# Patient Record
Sex: Female | Born: 1995 | Hispanic: Yes | Marital: Married | Smoking: Never smoker
Health system: Southern US, Community
[De-identification: ages and names within clinical notes are randomized; demographics above are authoritative.]

## PROBLEM LIST (undated history)

## (undated) ENCOUNTER — Inpatient Hospital Stay: Payer: Self-pay

## (undated) DIAGNOSIS — J36 Peritonsillar abscess: Secondary | ICD-10-CM

## (undated) DIAGNOSIS — N83209 Unspecified ovarian cyst, unspecified side: Secondary | ICD-10-CM

## (undated) DIAGNOSIS — G43909 Migraine, unspecified, not intractable, without status migrainosus: Secondary | ICD-10-CM

## (undated) HISTORY — PX: CHOLECYSTECTOMY: SHX55

## (undated) HISTORY — DX: Migraine, unspecified, not intractable, without status migrainosus: G43.909

## (undated) HISTORY — DX: Unspecified ovarian cyst, unspecified side: N83.209

---
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
## 2014-04-09 ENCOUNTER — Emergency Department: Payer: Self-pay | Admitting: Emergency Medicine

## 2014-04-09 LAB — CBC WITH DIFFERENTIAL/PLATELET
BASOS ABS: 0 10*3/uL (ref 0.0–0.1)
Basophil %: 0.4 %
EOS PCT: 3.1 %
Eosinophil #: 0.3 10*3/uL (ref 0.0–0.7)
HCT: 42 % (ref 35.0–47.0)
HGB: 13.6 g/dL (ref 12.0–16.0)
Lymphocyte #: 3.8 10*3/uL — ABNORMAL HIGH (ref 1.0–3.6)
Lymphocyte %: 37.3 %
MCH: 28.1 pg (ref 26.0–34.0)
MCHC: 32.4 g/dL (ref 32.0–36.0)
MCV: 87 fL (ref 80–100)
Monocyte #: 0.7 x10 3/mm (ref 0.2–0.9)
Monocyte %: 6.6 %
Neutrophil #: 5.4 10*3/uL (ref 1.4–6.5)
Neutrophil %: 52.6 %
Platelet: 257 10*3/uL (ref 150–440)
RBC: 4.84 10*6/uL (ref 3.80–5.20)
RDW: 13.4 % (ref 11.5–14.5)
WBC: 10.3 10*3/uL (ref 3.6–11.0)

## 2014-04-09 LAB — URINALYSIS, COMPLETE
BILIRUBIN, UR: NEGATIVE
Blood: NEGATIVE
GLUCOSE, UR: NEGATIVE mg/dL (ref 0–75)
KETONE: NEGATIVE
Nitrite: NEGATIVE
PROTEIN: NEGATIVE
Ph: 6 (ref 4.5–8.0)
RBC,UR: 2 /HPF (ref 0–5)
Specific Gravity: 1.023 (ref 1.003–1.030)
Squamous Epithelial: 5
WBC UR: 2 /HPF (ref 0–5)

## 2014-04-09 LAB — COMPREHENSIVE METABOLIC PANEL
ALK PHOS: 80 U/L
ALT: 25 U/L (ref 12–78)
ANION GAP: 6 — AB (ref 7–16)
AST: 17 U/L (ref 0–26)
Albumin: 3.7 g/dL — ABNORMAL LOW (ref 3.8–5.6)
BILIRUBIN TOTAL: 0.2 mg/dL (ref 0.2–1.0)
BUN: 10 mg/dL (ref 9–21)
Calcium, Total: 8.5 mg/dL — ABNORMAL LOW (ref 9.0–10.7)
Chloride: 106 mmol/L (ref 97–107)
Co2: 25 mmol/L (ref 16–25)
Creatinine: 0.56 mg/dL — ABNORMAL LOW (ref 0.60–1.30)
EGFR (African American): >60
Glucose: 81 mg/dL (ref 65–99)
OSMOLALITY: 272 (ref 275–301)
Potassium: 3.8 mmol/L (ref 3.3–4.7)
SODIUM: 137 mmol/L (ref 132–141)
Total Protein: 7.7 g/dL (ref 6.4–8.6)

## 2014-04-09 LAB — HCG, QUANTITATIVE, PREGNANCY: Beta Hcg, Quant.: 9921 m[IU]/mL — ABNORMAL HIGH

## 2014-04-09 LAB — LIPASE, BLOOD: LIPASE: 233 U/L (ref 73–393)

## 2014-07-04 ENCOUNTER — Ambulatory Visit: Payer: Self-pay | Admitting: Advanced Practice Midwife

## 2014-09-09 ENCOUNTER — Observation Stay: Payer: Self-pay

## 2014-09-09 LAB — URINALYSIS, COMPLETE
BILIRUBIN, UR: NEGATIVE
Blood: NEGATIVE
Glucose,UR: NEGATIVE mg/dL (ref 0–75)
Ketone: NEGATIVE
Nitrite: POSITIVE
Ph: 6 (ref 4.5–8.0)
Protein: NEGATIVE
RBC,UR: 2 /HPF (ref 0–5)
Specific Gravity: 1.017 (ref 1.003–1.030)
Squamous Epithelial: 11

## 2014-09-11 LAB — URINE CULTURE

## 2014-10-07 ENCOUNTER — Observation Stay: Payer: Self-pay

## 2014-10-08 ENCOUNTER — Emergency Department: Payer: Self-pay | Admitting: Emergency Medicine

## 2014-10-27 ENCOUNTER — Observation Stay: Payer: Self-pay | Admitting: Obstetrics & Gynecology

## 2014-11-15 ENCOUNTER — Emergency Department: Payer: Self-pay | Admitting: Emergency Medicine

## 2014-11-16 ENCOUNTER — Observation Stay: Payer: Self-pay | Admitting: Obstetrics & Gynecology

## 2014-11-16 LAB — PIH PROFILE
ANION GAP: 11 (ref 7–16)
BUN: 6 mg/dL — ABNORMAL LOW (ref 7–18)
CHLORIDE: 108 mmol/L — AB (ref 98–107)
CREATININE: 0.44 mg/dL — AB (ref 0.60–1.30)
Calcium, Total: 7.9 mg/dL — ABNORMAL LOW (ref 9.0–10.7)
Co2: 21 mmol/L (ref 21–32)
Glucose: 84 mg/dL (ref 65–99)
HCT: 31.7 % — ABNORMAL LOW (ref 35.0–47.0)
HGB: 10.3 g/dL — AB (ref 12.0–16.0)
MCH: 24.3 pg — AB (ref 26.0–34.0)
MCHC: 32.6 g/dL (ref 32.0–36.0)
MCV: 75 fL — ABNORMAL LOW (ref 80–100)
Osmolality: 276 (ref 275–301)
Platelet: 289 10*3/uL (ref 150–440)
Potassium: 3.8 mmol/L (ref 3.5–5.1)
RBC: 4.26 10*6/uL (ref 3.80–5.20)
RDW: 15.9 % — ABNORMAL HIGH (ref 11.5–14.5)
SGOT(AST): 13 U/L (ref 0–26)
Sodium: 140 mmol/L (ref 136–145)
Uric Acid: 3.2 mg/dL (ref 3.0–5.8)
WBC: 9.4 10*3/uL (ref 3.6–11.0)

## 2014-11-16 LAB — PROTEIN / CREATININE RATIO, URINE
Creatinine, Urine: 125.7 mg/dL — ABNORMAL HIGH (ref 30.0–125.0)
PROTEIN/CREAT. RATIO: 159 mg/g{creat} (ref 0–200)
Protein, Random Urine: 20 mg/dL — ABNORMAL HIGH (ref 0–12)

## 2014-11-17 LAB — SEDIMENTATION RATE: ERYTHROCYTE SED RATE: 50 mm/h — AB (ref 0–20)

## 2014-11-22 ENCOUNTER — Inpatient Hospital Stay: Payer: Self-pay

## 2014-11-22 LAB — CBC WITH DIFFERENTIAL/PLATELET
BASOS ABS: 0 10*3/uL (ref 0.0–0.1)
Basophil %: 0.5 %
EOS ABS: 0.2 10*3/uL (ref 0.0–0.7)
Eosinophil %: 1.9 %
HCT: 32.5 % — ABNORMAL LOW (ref 35.0–47.0)
HGB: 10.5 g/dL — AB (ref 12.0–16.0)
LYMPHS PCT: 26.2 %
Lymphocyte #: 2.5 10*3/uL (ref 1.0–3.6)
MCH: 23.7 pg — ABNORMAL LOW (ref 26.0–34.0)
MCHC: 32.2 g/dL (ref 32.0–36.0)
MCV: 74 fL — ABNORMAL LOW (ref 80–100)
Monocyte #: 0.5 x10 3/mm (ref 0.2–0.9)
Monocyte %: 5.5 %
Neutrophil #: 6.4 10*3/uL (ref 1.4–6.5)
Neutrophil %: 65.9 %
Platelet: 276 10*3/uL (ref 150–440)
RBC: 4.41 10*6/uL (ref 3.80–5.20)
RDW: 16 % — ABNORMAL HIGH (ref 11.5–14.5)
WBC: 9.7 10*3/uL (ref 3.6–11.0)

## 2014-11-23 LAB — HEMATOCRIT: HCT: 28.7 % — ABNORMAL LOW (ref 35.0–47.0)

## 2014-11-23 LAB — GC/CHLAMYDIA PROBE AMP

## 2015-02-06 NOTE — Consult Note (Signed)
ERCP showed multiple stones/stone fragments, which were successfully removed. Clear liquid diet today. Low fat diet tomorrow if patient remains stable. Thanks.  Electronic Signatures: Lutricia Feilh, Yishai Rehfeld (MD)  (Signed on 26-Aug-13 14:30)  Authored  Last Updated: 26-Aug-13 14:30 by Lutricia Feilh, Rayhaan Huster (MD)

## 2015-02-06 NOTE — H&P (Signed)
PATIENT NAME:  Mandy PressmanCRUZ CECILIO, Romero MR#:  [id number] DATE OF BIRTH:  May 21, 1996  DATE OF ADMISSION:  06/12/2012  CHIEF COMPLAINT: Epigastric pain.   HISTORY OF PRESENT ILLNESS: This is a patient with multiple episodes while she was pregnant. She is now two months postpartum but last night she started having epigastric pain, some back pain, nausea, and multiple emesis. It has worsened today. She is still vomiting and having epigastric pain, minimal right upper quadrant pain. She has had smaller or lesser episodes during pregnancy starting approximately seven months in her pregnancy but she is now two months postpartum and had not had any attacks until just the last week or so.   PAST MEDICAL HISTORY: None.   PAST SURGICAL HISTORY: None.   ALLERGIES: None.   MEDICATIONS: None.   FAMILY HISTORY: Noncontributory.   SOCIAL HISTORY: She does not smoke or drink. She works at [employer].  REVIEW OF SYSTEMS: All systems reviewed including those in the HPI and all negative with the exception of that mentioned in the HPI.   She has had no jaundice or acholic stools and has normal bowel movements without melena or hematochezia.   PHYSICAL EXAMINATION:   GENERAL: Healthy appearing Hispanic female patient. She appears comfortable.   VITAL SIGNS: Stable. She is afebrile.   HEENT: No scleral icterus.   NECK: No palpable neck nodes.   CHEST: Clear to auscultation.   CARDIAC: Regular rate and rhythm.   ABDOMEN: Soft. She is tender in the epigastrium and right upper quadrant with a questionable Murphy's sign.   EXTREMITIES: Without edema. Calves are nontender.   NEUROLOGIC: Grossly intact.   INTEGUMENTARY: No jaundice.   LABORATORY, DIAGNOSTIC, AND RADIOLOGICAL DATA: Laboratory values demonstrate 2+ leukocyte esterase in her urine. LFTs are elevated, AST of 48 and ALT of 80, total bilirubin of 0.2. White blood cell count 8.8, hemoglobin and hematocrit 12.8 and 39.   An ultrasound showed  stones.   ASSESSMENT AND PLAN: This is a patient with gallstones and probable choledocholithiasis. She has nausea, vomiting, and pain control problems. I have recommended admission to the hospital, hydration, controlling her nausea and vomiting, and treating with antibiotics. We will plan laparoscopic cholecystectomy after rechecking her liver function tests and perform cholangiogram as well. The rationale for this action was discussed with her. She understood and agreed to proceed.   ____________________________ Adah Salvageichard E. Excell Seltzerooper, MD rec:drc D: 06/12/2012 00:53:03 ET T: 06/12/2012 07:42:27 ET JOB#: [id number]  cc: Adah Salvageichard E. Excell Seltzerooper, MD, <Dictator> Lattie HawICHARD E Devante Capano MD ELECTRONICALLY SIGNED 06/12/2012 19:42

## 2015-02-06 NOTE — H&P (Signed)
Subjective/Chief Complaint epig pain    History of Present Illness mult episodes since 45mo pregnant, now 2 mos post partum nausea emesis no jaundice, nml BM    Past History PMH none  PSH none   Past Med/Surgical Hx:  Denies:   ALLERGIES:  No Known Allergies:   Family and Social History:   Family History Non-Contributory    Social History negative tobacco, negative ETOH, [employer]    Place of Living Home   Review of Systems:   Fever/Chills No    Cough No    Abdominal Pain Yes    Diarrhea No    Constipation No    Nausea/Vomiting Yes    SOB/DOE No    Chest Pain No    Dysuria No    Tolerating Diet No  Nauseated  Vomiting    Medications/Allergies Reviewed Medications/Allergies reviewed   Physical Exam:   GEN no acute distress    HEENT pink conjunctivae    NECK supple    RESP normal resp effort  clear BS  no use of accessory muscles    CARD regular rate    ABD positive tenderness  soft    LYMPH negative neck    EXTR negative edema    SKIN normal to palpation    PSYCH alert, A+O to time, place, person, good insight   Lab Results: Hepatic:  23-Aug-13 22:03    Bilirubin, Total 0.2   Alkaline Phosphatase 128   SGPT (ALT)  80   SGOT (AST)  48   Total Protein, Serum 8.1   Albumin, Serum 4.2  Routine Chem:  23-Aug-13 22:03    Glucose, Serum 87   BUN 11   Creatinine (comp) 0.69   Sodium, Serum 141   Potassium, Serum 3.4   Chloride, Serum 107   CO2, Serum 24   Calcium (Total), Serum  8.9   Osmolality (calc) 280   Anion Gap 10 (Result(s) reported on 11 Jun 2012 at 10:48PM.)   Lipase 259 (Result(s) reported on 11 Jun 2012 at 10:48PM.)  Routine UA:  23-Aug-13 22:03    Color (UA) Yellow   Clarity (UA) Turbid   Glucose (UA) Negative   Bilirubin (UA) Negative   Ketones (UA) Negative   Specific Gravity (UA) 1.023   Blood (UA) 3+   pH (UA) 5.0   Protein (UA) 100 mg/dL   Nitrite (UA) Negative   Leukocyte Esterase (UA) 2+ (Result(s)  reported on 11 Jun 2012 at 10:37PM.)   RBC (UA) 2362 /HPF   WBC (UA) 107 /HPF   Bacteria (UA) NONE SEEN   Epithelial Cells (UA) 3 /HPF (Result(s) reported on 11 Jun 2012 at 10:37PM.)  Routine Sero:  23-Aug-13 22:03    Pregnancy Test, Urine NEGATIVE (The results of the qualitative urine HCG (Pregnancy Test) should be evaluated in light of other clinical information.  There are limitations to the test which, in certain clinical situations, may result in a false positive or negative result. Thehigh dose hook effect can occur in urine samples with extremely high HCG concentrations.  This effect can produce a negative result in certain situations. It is suggested that results of the qualitative HCG be confirmed by an alternate methodology, such as the quantitative serum beta HCG test.)  Routine Hem:  23-Aug-13 22:03    WBC (CBC) 8.8   RBC (CBC)  5.27   Hemoglobin (CBC) 12.8   Hematocrit (CBC) 39.1   Platelet Count (CBC) 316 (Result(s) reported on 11 Jun 2012 at  10:26PM.)   MCV  74   MCH  24.3   MCHC 32.7   RDW  16.3     Assessment/Admission Diagnosis choledocholithiasis admit, hydrate, control nausea lap chole with grams later, recheck LFT   Electronic Signatures: Lattie Hawooper, Vietta Bonifield E (MD)  (Signed 24-Aug-13 00:49)  Authored: CHIEF COMPLAINT and HISTORY, PAST MEDICAL/SURGIAL HISTORY, ALLERGIES, FAMILY AND SOCIAL HISTORY, REVIEW OF SYSTEMS, PHYSICAL EXAM, LABS, ASSESSMENT AND PLAN   Last Updated: 24-Aug-13 00:49 by Lattie Hawooper, Shaolin Armas E (MD)

## 2015-02-06 NOTE — Consult Note (Signed)
Consult dictated. 19 yo who's s/p chloy yest. Positive IOC. No family hx. Discussed ERCP in detail as well as potential complications, incl pancreatitis, which tends to be higher in young females. Will need to hold heparin inj today. Pt not breast feeding baby. So, no worry about transmission of anesthesia meds to baby. Plan later this afternoon. NPO today except meds. Thanks.  Electronic Signatures: Lutricia Feilh, Amiliah Campisi (MD)  (Signed on 26-Aug-13 07:46)  Authored  Last Updated: 26-Aug-13 07:46 by Lutricia Feilh, Flois Mctague (MD)

## 2015-02-06 NOTE — Op Note (Signed)
PATIENT NAME:  Mandy Romero, Mandy Romero MR#:  [id number] DATE OF BIRTH:  08-23-1996  DATE OF PROCEDURE:  06/13/2012  PREOPERATIVE DIAGNOSIS: Cholelithiasis and chronic cholecystitis.  POSTOPERATIVE DIAGNOSES:  1. Cholelithiasis and chronic cholecystitis. 2. Common bile duct stones.   OPERATIVE PROCEDURE: Laparoscopic cholecystectomy with cholangiography.   SURGEON: Quentin Orealph L. Ely, III, MD    ANESTHESIA: General.   OPERATIVE PROCEDURE: With the patient in the supine position after induction of appropriate general anesthesia, the patient's abdomen was prepped with ChloraPrep and draped with sterile towels. The patient was placed in the head down, feet up position. A small infraumbilical incision was made in the standard fashion and carried down bluntly through the subcutaneous tissue. Veress needle was used to cannulate the peritoneal cavity. CO2 was insufflated to appropriate pressure measurements. When approximately 2.5 liters of CO2 were instilled, the Veress needle was withdrawn and an 11 mm [employer] port was inserted into the peritoneal cavity. Intraperitoneal position was confirmed and CO2 was re-insufflated. The patient was placed in the head up, feet down position and rotated slightly to the left side. A subxiphoid transverse incision was made and an 11 mm port was inserted under direct vision. Two lateral ports 5 mm in size were inserted under direct vision. The gallbladder was mildly discolored and had some subacute inflammation. Multiple stones were identified in the gallbladder. Bile duct appeared to be slightly enlarged as did the common bile duct. The gallbladder was retracted superiorly and laterally exposing the hepatoduodenal ligament. The cystic artery and cystic duct were identified. The cystic duct was clipped on the gallbladder side and opened. An on table cholangiogram using dynamic fluoroscopy revealed restricted flow of dye into the duodenum and multiple common bile duct stones  and slightly dilated duct. The catheter was manipulated in an attempt to remove some of the stones and flush the duct. No stones were able to be removed. The cholangiogram was then repeated and at least 6 to 7 gallstones were noted floating in the midportion of the common bile duct. There was better emptying into the duodenum. The cystic duct was triply clipped and divided. The cystic artery was doubly clipped and divided. The gallbladder was then dissected free from its bed and delivered using hook and cautery apparatus. Once the gallbladder was free, the camera was switched to the subxiphoid port and the gallbladder was brought through the umbilical port using an 11 mm grasping instrument. The area was copiously suctioned and irrigated. All ports were withdrawn without difficulty. The abdomen was desufflated. Midline fascia was closed with 0 Vicryl. Skin was closed with 5-0 nylon. The area was infiltrated with 0.25% Marcaine for postoperative pain control. Sterile dressings were applied. The patient was returned to the recovery room having tolerated the procedure well.   ____________________________ Carmie Endalph L. Ely III, MD rle:drc D: 06/13/2012 09:20:51 ET T: 06/13/2012 11:48:40 ET JOB#: [id number]  cc: Carmie Endalph L. Ely III, MD, <Dictator> Dr. Mikey KirschnerBrooke Hart  Quentin OreALPH L ELY MD ELECTRONICALLY SIGNED 06/13/2012 18:20

## 2015-02-06 NOTE — Consult Note (Signed)
PATIENT NAME:  Mandy Romero, Mandy Romero MR#:  [id number] DATE OF BIRTH:  May 02, 1996  DATE OF CONSULTATION:  06/14/2012  REFERRING PHYSICIAN:   CONSULTING PHYSICIAN:  Ezzard StandingPaul Y. Valery Chance, MD  REASON FOR REFERRAL: Common bile duct stones.   HISTORY OF PRESENT ILLNESS: This is a 19 year old Hispanic female who has had multiple episodes of abdominal pain during the later part of her pregnancy. The patient was told that she had gallstones. The patient is now two months postpartum and presented to the Emergency Room complaining of epigastric pain and some back pain as well as nausea and vomiting. When she got admitted she was still having lots of symptoms. The patient was found to have cholecystitis with gallstones. Therefore, the decision was made to have surgery immediately. Also on admission, liver enzymes were elevated, but the white count was normal. The patient underwent a cholecystectomy yesterday morning by Dr. Mechele CollinElliott. Intraoperative cholangiogram showed evidence of common bile duct stones. Therefore, I was asked to see the patient and schedule her for ERCP today. The patient is sore from the surgery but denies any nausea and vomiting. There are no fevers or chills.   REVIEW OF SYSTEMS: No fevers or chills. No weight gain or weight loss. The patient is not breast feeding. There is no chest pain, palpitations, coughing or shortness of breath. The rest of the review of symptoms has not changed since admission. There is no gross hematochezia or melena.   PAST MEDICAL HISTORY:  There is no other past medical history.   PAST SURGICAL HISTORY:  There is no surgical history other than the gallbladder surgery.   ALLERGIES: She has no known allergies.   MEDICATIONS: She was not on any medications at home. She is on antibiotics currently. She also gets heparin subcutaneous injections every eight hours now.   FAMILY HISTORY: Negative for any gallbladder history. She does not drink or smoke. She works at [employer].    PHYSICAL EXAMINATION:  GENERAL: The patient is in no acute distress. She is afebrile with temperature 98.2, pulse 70, respirations 18, blood pressure 108/70. Oxygenation is 98%.   HEENT: Normocephalic, atraumatic head. Pupils are equally reactive. Throat was clear.   NECK: Supple.   CARDIAC: Regular rhythm and rate without murmurs.   LUNGS: Lungs are clear bilaterally.   ABDOMEN: Normoactive bowel sounds, soft. There is some mild diffuse tenderness, particularly at the surgical site. There is no rebound or guarding.   EXTREMITIES: No clubbing, cyanosis, or edema.   SKIN: Negative.   NEUROLOGIC: Examination is nonfocal.   LABORATORY, RADIOLOGICAL AND DIAGNOSTIC DATA: Yesterday sodium 140, potassium 3.8, chloride 106, CO2 25, BUN 7, creatinine 0.6, glucose 115, total bilirubin 0.5, alkaline phosphatase 118. AST went from 48 to 261, ALT went from 80 to 262. Her white count today is 7.8. Hemoglobin is slowly coming down to 9.6. It was 12.8 on admission. Platelet count was 276. MCV 75. Urinalysis is negative. Pregnancy test is negative. Mono test was negative.   ASSESSMENT AND PLAN: This is a young girl who is postpartum who underwent cholecystectomy yesterday. She still has evidence of stones in the bile duct according to the intraoperative cholangiogram. The patient was made n.p.o. after midnight last night. I discussed ERCP in detail with the patient. I also discussed potential risks including bleeding and pancreatitis. The risk of pancreatitis tends to be higher in young females. We will hold the heparin dose. The patient will continue with the antibiotics. We will plan on ERCP later this afternoon.  Thank you for the referral.  ____________________________ Ezzard Standing. Bluford Kaufmann, MD pyo:ap D: 06/14/2012 07:52:23 ET               T: 06/14/2012 10:40:07 ET JOB#: [id number] cc: Ezzard Standing. Bluford Kaufmann, MD, <Dictator> Ezzard Standing Dartha Rozzell MD ELECTRONICALLY SIGNED 06/17/2012 12:41

## 2015-02-06 NOTE — Discharge Summary (Signed)
PATIENT NAME:  Mandy Romero, Mandy Romero MR#:  [id number] DATE OF BIRTH:  12-31-1995  DATE OF ADMISSION:  06/12/2012 DATE OF DISCHARGE:  06/15/2012  BRIEF HISTORY: Mandy Romero is a 19 year old girl admitted through the Emergency Room with signs and symptoms consistent with biliary colic, possible acute cholecystitis, possible choledocholithiasis. Work-up in the Emergency Room demonstrated what appeared to be stones in her common bile duct.   HOSPITAL COURSE: Because of scheduling difficulty she could not be operated on on the 24th. She was taken to surgery on the morning of the 25th where she underwent laparoscopic cholecystectomy. Cholangiogram was accomplished which demonstrated multiple common bile duct stones. She recovered uneventfully. She was seen by the GI service the following morning and underwent ERCP that afternoon. The procedure was uncomplicated performed by Dr. Lutricia FeilPaul Oh of the GI service. Multiple stones plus stone fragments were removed. No stones remained on the completion cholangiogram. She had no further symptoms. She was discharged home on the 27th to be followed in the office in 7 to 10 days' time.   DISCHARGE INSTRUCTIONS: Bathing, activity, and driving instructions were given to patient. She was discharged home on Norco 1 to 2 tablets every six hours p.r.n. pain.   FINAL DISCHARGE DIAGNOSIS: Chronic cholecystitis, cholelithiasis, choledocholithiasis.   SURGERY: Laparoscopic cholecystectomy with cholangiography and ERCP.  ____________________________ Carmie Endalph L. Ely III, MD rle:rbg D: 06/22/2012 23:51:24 ET T: 06/24/2012 10:23:41 ET JOB#: [id number]  cc: Carmie Endalph L. Ely III, MD, <Dictator> Ezzard StandingPaul Y. Bluford Kaufmannh, MD Quentin OreALPH L ELY MD ELECTRONICALLY SIGNED 06/24/2012 20:54

## 2015-02-18 NOTE — Consult Note (Signed)
PATIENT NAME:  Mandy Romero, Mandy Romero MR#:  [id number] DATE OF BIRTH:  07/04/1996  DATE OF CONSULTATION:  11/17/2014  REFERRING PHYSICIAN:   CONSULTING PHYSICIAN:  Pauletta BrownsYuriy Rheanne Cortopassi, MD  REASON FOR CONSULTATION: Headaches.   HISTORY OF PRESENT ILLNESS: This is a 19 year old female, 5738 weeks pregnant with her 2nd child, comes in with a complaint of 2-week history of headache that is located in the left temporal area and radiates to the left eye, pressure, sharp, stabbing, can last from 10-12 hours a day. Positive phonophobia, positive photophobia, not relieved with rest. The patient recently admitted to the Emergency Department and at that time, the suspicion was that the patient has temporal arteritis. The patient was started on steroids. Comes in due to no relief of her headache. No history of prior headaches before. No signs of eclampsia or preeclampsia on this admission. Yesterday, the patient received magnesium. She is status post triptan IV today with minimal relief.   PAST MEDICAL HISTORY: No past medical history of headaches.   FAMILY HISTORY: No family history of headaches.   REVIEW OF SYSTEMS: No shortness of breath. No abdominal pain. No chest pain. No weakness on one side of the body compared to the other. Positive for headache. Tongue is midline. Uvula elevates symmetrically. Motor strength 5/5 bilateral upper and lower extremities. Sensation intact to light touch and temperature. Coordination: Finger-to-nose intact. Gait not assessed.   IMPRESSION: A 19 year old female who is [redacted] weeks pregnant with her 2nd child, comes in with 2-week history of on and off headaches, associated with photophobia, phonophobia, left temporal radiating into the left eye. There is some minimal swelling on the left side of her face as well.   PLAN: Not convinced this is temporal arteritis. This is a 19 year old patient who had sudden onset of symptoms. No pain on mastication. Area is tender to touch, but it is not  just the temporal area, it is diffuse. Possible sinusitis, as well, that could be contributing to this pain. At this point, I will give the patient Decadron 10 mg IV x 1 to stop the headache. This case was discussed with the pharmacy, as well, for safety of Decadron. MRI brain without contrast and MRV time-of-flight, which is flow void without contrast, was ordered to rule out any possibility of sinus thrombosis that could be contributing to headache in a pregnant patient. Will follow-up. The patient should also be started on high flow oxygen, even though I do not think this is a cluster headache, but it might help relieve it. Will follow up. This case was discussed with the patient's primary team.   Thank you. Please call with any questions     ____________________________ Pauletta BrownsYuriy Vaughn Frieze, MD yz:mw D: 11/17/2014 12:05:00 ET T: 11/17/2014 12:33:11 ET JOB#: [id number]  cc: Pauletta BrownsYuriy Roddy Bellamy, MD, <Dictator> Pauletta BrownsYURIY Zymeir Salminen MD ELECTRONICALLY SIGNED 11/28/2014 12:06

## 2015-02-27 NOTE — H&P (Signed)
L&D Evaluation:  History Expanded:  HPI 19 year old G2 P1001 with EDC=11/29/2014, presents at 39 weeks for IOL for severe headache. Pt was evaluated in-patient last week for severe ha, normal MRI, neuro also saw pt. Sx somewhat managed with Oxycodone. +FM, No VB, mild contractions. PNC at [facility]. Prior SVD 03/2012 6# 12oz.   Gravida 2   Term 1   Blood Type (Maternal) O positive   Group B Strep Results Maternal (Result >5wks must be treated as unknown) negative   Maternal HIV Negative   Maternal Syphilis Ab Nonreactive   Maternal Varicella Immune   Rubella Results (Maternal) immune   Maternal T-Dap Immune   Presents with IOL   Patient's Medical History Severe headache - onset 3 weeks ago   Patient's Surgical History Cholecystectomy 2013   Medications Pre Natal Vitamins  Oxycodone for HA (last taken 11/20/14)   Allergies NKDA   Social History none   Family History Non-Contributory   ROS:  ROS see HPI   Exam:  Vital Signs stable   Urine Protein not completed   General no apparent distress   Mental Status clear   Abdomen gravid, non-tender   Estimated Fetal Weight Average for gestational age   Back no CVAT   Edema no edema   Pelvic no external lesions, 4.5/75/-1   Mebranes Intact   FHT normal rate with no decels, category 1 tracing   Ucx irregular   Impression:  Impression IUP at 39 wks, IOL for severe headache   Plan:  Comments Pitocin IOL - risks and benefits of IOL reviewed with pt including FITL, tachysystole and need for c-section for either ready. Pt in agreement with POM.  Plan AROM once in active labor Plans IV meds only for pain management. Will try to avoid prolonged 2nd stage.   Electronic Signatures: Vella KohlerBrothers, Mukesh Kornegay K (CNM)  (Signed 03-Feb-16 09:03)  Authored: L&D Evaluation   Last Updated: 03-Feb-16 09:03 by Vella KohlerBrothers, Eldoris Beiser K (CNM)

## 2015-02-27 NOTE — H&P (Signed)
L&D Evaluation:  History Expanded:  HPI 19 year old G2 P1001 with EDC=11/29/2014 presents at 1072w2d with c/o headache, earache and bilateral upper abdominal pain. +FM, No VB or contractions. PNC at [facility]   Presents with dysuria   Patient's Medical History No Chronic Illness   Patient's Surgical History Cholecystectomy 2013   Medications Pre Natal Vitamins  Tylenol (Acetaminophen)   Allergies NKDA   Social History none   Family History Non-Contributory   ROS:  ROS see HPI   Exam:  Vital Signs stable   Urine Protein not completed   General no apparent distress   Mental Status clear   Abdomen gravid, non-tender, mildly tender bilateral ribs   Pelvic deferred   Mebranes Intact   FHT normal rate with no decels, category 1 tracing   Ucx absent   Other No erythema in ear canal   Impression:  Impression IUP at 32 wks, physiologic rib pain of pregnancy, no evidence of pre-eclampsia   Plan:  Comments To ER for evaluation of ear pain   Follow Up Appointment need to R/s at [facility] this week   Electronic Signatures: Alliah Boulanger, Marta Lamasamara K (CNM)  (Signed 20-Dec-15 00:14)  Authored: L&D Evaluation   Last Updated: 20-Dec-15 00:14 by Vella KohlerBrothers, Wing Gfeller K (CNM)

## 2015-02-27 NOTE — H&P (Signed)
L&D Evaluation:  History Expanded:  HPI 19 year old G2 P1001 with EDC=11/29/2014 or 12/07/14 (Unclear estimated date of confinement as early Ultrasound was gest sac only and 18 week Ultrasound was 2/18, yet patient has been using 2/10)... presents at 3987w1d with c/o  abdominal pain. +FM, No VB or contractions. PNC at [facility].   Gravida 2   Term 1   Presents with abdominal pain   Patient's Medical History No Chronic Illness   Patient's Surgical History Cholecystectomy 2013   Medications Pre Natal Vitamins  Tylenol (Acetaminophen)   Allergies NKDA   Social History none   Family History Non-Contributory   ROS:  ROS see HPI   Exam:  Vital Signs stable   Urine Protein not completed   General no apparent distress   Mental Status clear   Abdomen gravid, non-tender   Estimated Fetal Weight Average for gestational age   Back no CVAT   Edema no edema   Pelvic no external lesions, 2/50/-3   Mebranes Intact   FHT normal rate with no decels, category 1 tracing   Ucx absent, w rare small contraction, patient does not feel   Impression:  Impression IUP at 34 wks, abd pain, not in Preterm Labor   Plan:  Plan EFM/NST, monitor contractions and for cervical change, fluids   Follow Up Appointment already scheduled   Electronic Signatures: Letitia LibraHarris, Robert Paul (MD)  (Signed 08-Jan-16 20:43)  Authored: L&D Evaluation   Last Updated: 08-Jan-16 20:43 by Letitia LibraHarris, Robert Paul (MD)

## 2015-02-27 NOTE — H&P (Signed)
L&D Evaluation:  History:   HPI 19 yo G1P0 with LMP of 07/06/11 & EDD of 04/11/12 & EDD by US at 6 5/7 weeks with EDD of 04/15/12 with pNC at [facility] significant for teen pregnancy, UTI, Galactorrhea [facility] this pm with cx exam of 5/80/vtx, No ROM, No VB, no decreased FM. Pt is having UC's becoming more uncomfortable tonight.    Presents with contractions    Patient's Medical History Galactorrhea, Teen preg, UTI    Patient's Surgical History none    Medications Pre Natal Vitamins    Allergies NKDA    Social History tobacco    Family History Non-Contributory   ROS:   ROS All systems were reviewed.  HEENT, CNS, GI, GU, Respiratory, CV, Renal and Musculoskeletal systems were found to be normal.   Exam:   Vital Signs stable    General no apparent distress    Mental Status clear    Chest clear    Heart normal sinus rhythm, no murmur/gallop/rubs    Abdomen gravid, non-tender    Estimated Fetal Weight Average for gestational age    Back no CVAT    Edema 1+    Reflexes 1+    Pelvic 5/80/vtx    Mebranes Intact    FHT normal rate with no decels, Accels noted    Ucx regular    Skin dry    Lymph no lymphadenopathy   Impression:   Impression IUP at 37 6/7 weeks w/active labor   Plan:   Plan monitor contractions and for cervical change, Antic SVD, Hydrate with IV, UC/FHT monitors    Comments Admit for delivery   Electronic Signatures: Sharee PimpleJones, Dionisio Aragones W (CNM)  (Signed 10-Jun-13 23:00)  Authored: L&D Evaluation   Last Updated: 10-Jun-13 23:00 by Sharee PimpleJones, Drusilla Wampole W (CNM)

## 2015-02-27 NOTE — H&P (Signed)
L&D Evaluation:  History:  HPI 19 year old G2 P1001 with EDC=12/07/2014 by a 17wk5day ultrasound presents at 27 weeks with c/o dysuria x 3 days. Also noted some hematuria when wiping earlier today. Has been having burning pain over her pubic bone also x 3 weeks. Tylenol ES helps alittle with this pain. Has some lower back pain also. Baby active. No VB or vaginal discharge. No vulvar itching or burning. Has had UTIs x2 in the past. No hx of urolithiasis. PNC at [facility]   Presents with dysuria   Patient's Medical History No Chronic Illness   Patient's Surgical History Cholecystectomy 2013   Medications Pre Natal Vitamins  Tylenol (Acetaminophen)   Allergies NKDA   Social History none   Family History Non-Contributory   ROS:  ROS see HPI   Exam:  Vital Signs 108/64   Urine Protein negative dipstick, UA+ hazy, positive nitrite, +1 leuks, 2 RBC, 26 WBC, +1 bacteria, 11 epi cells   General no apparent distress   Mental Status clear   Abdomen gravid, non-tender, tenderness over symphysis pubis   Back no CVAT   Pelvic no external lesions, cervix closed and thick, Baby OOP   Mebranes Intact   FHT normal rate with no decels, 135-140 with acels to160s-170   Ucx absent   Impression:  Impression IUP at 27 weeks with UTI.   Plan:  Plan Urine culture pending. Start macrobid and given RX for Macrobid 100 BID x 7 days. Enc to increase water intake. Heat to symphsis pubis or ice OK. FU with [facility] on 30 December or RTN to L&D for fever or worsening sx.   Electronic Signatures: Trinna BalloonGutierrez, Ediel Unangst L (CNM)  (Signed 21-Nov-15 23:22)  Authored: L&D Evaluation   Last Updated: 21-Nov-15 23:22 by Trinna BalloonGutierrez, Zamere Pasternak L (CNM)

## 2015-02-27 NOTE — H&P (Signed)
L&D Evaluation:  History:  HPI 19 year old G2P1001 at 773w1d by 5 week US derived EDC of 11/29/2014 presenting with 2 week history of headache.  Has been followed by [facility] and tried to start on triptan but awaiting medicaid approval.  She was seen in the ER yesterday and diagnosed with temporal arteritis, given steroids and discharge.  Pain did not improve during course of ER admission.  Pain is described at left retro-orbital/temporal, stabbing, 10/10 at worst.  She reports intermitten symptom free episodes, does report be woken from sleep at night around 0100 because of headache but not necessarily same time every night.  She has only taken tylenol for the headache at home.  No history of migraines or cluster headaches outside of pregnancy.  She reports IPSILATERAL rhinorrhea and lacrimation.  Normotensive thus far in preganancy.  Also reports mild nausea, photosensitivity.  No fevers, chills, or nuchal rigidity.  +FM, no LOF, no VB, irregular contractions.   Presents with contractions   Patient's Medical History No Chronic Illness   Patient's Surgical History none   Medications Pre Natal Vitamins   Allergies NKDA   Social History none   Family History Non-Contributory   ROS:  ROS All systems were reviewed.  HEENT, CNS, GI, GU, Respiratory, CV, Renal and Musculoskeletal systems were found to be normal.   Exam:  Vital Signs stable   Urine Protein not completed   General no apparent distress, slight pitosis left eyelid, PERRLA, no lacrimation   Mental Status clear   Abdomen gravid, non-tender   Estimated Fetal Weight Average for gestational age   Fetal Position vtx   Pelvic no external lesions   Mebranes Intact   FHT normal rate with no decels, 135-140, moderate, positive accels, no decels   Ucx irregular   Impression:  Impression G2P1001 at 433w1d with migraine vs cluster headache   Plan:  Comments 1) Headache - neurology consult.  If ok with neurology IV triptan,  IV compazine, and supplemental O2 are ok.  Will defer to neurology as to any imaging alhtough I don't feel strongly in absence of other neurologic symptoms.  BP normotensive but will check PIH panel and P/C ration to rule out atypical presentation of preeclampsia  2) Fetus - category I tracing  3) Disposition - pending improvement in headache and evaluation by neurology   Electronic Signatures: Lorrene ReidStaebler, Tamyka Bezio M (MD)  (Signed 28-Jan-16 16:36)  Authored: L&D Evaluation   Last Updated: 28-Jan-16 16:36 by Lorrene ReidStaebler, Liese Dizdarevic M (MD)

## 2015-03-08 ENCOUNTER — Encounter: Payer: Self-pay | Admitting: Emergency Medicine

## 2015-03-08 ENCOUNTER — Emergency Department: Payer: Medicaid Other

## 2015-03-08 ENCOUNTER — Emergency Department
Admission: EM | Admit: 2015-03-08 | Discharge: 2015-03-08 | Disposition: A | Discharge status: Home / Self Care | Payer: Medicaid Other | Attending: Emergency Medicine | Admitting: Emergency Medicine

## 2015-03-08 ENCOUNTER — Other Ambulatory Visit: Payer: Self-pay

## 2015-03-08 DIAGNOSIS — Y990 Civilian activity done for income or pay: Secondary | ICD-10-CM | POA: Diagnosis not present

## 2015-03-08 DIAGNOSIS — Y9289 Other specified places as the place of occurrence of the external cause: Secondary | ICD-10-CM | POA: Insufficient documentation

## 2015-03-08 DIAGNOSIS — X58XXXA Exposure to other specified factors, initial encounter: Secondary | ICD-10-CM | POA: Insufficient documentation

## 2015-03-08 DIAGNOSIS — S8992XA Unspecified injury of left lower leg, initial encounter: Secondary | ICD-10-CM | POA: Insufficient documentation

## 2015-03-08 DIAGNOSIS — S299XXA Unspecified injury of thorax, initial encounter: Secondary | ICD-10-CM | POA: Diagnosis not present

## 2015-03-08 DIAGNOSIS — S199XXA Unspecified injury of neck, initial encounter: Secondary | ICD-10-CM | POA: Diagnosis not present

## 2015-03-08 DIAGNOSIS — S3992XA Unspecified injury of lower back, initial encounter: Secondary | ICD-10-CM | POA: Insufficient documentation

## 2015-03-08 DIAGNOSIS — Y9389 Activity, other specified: Secondary | ICD-10-CM | POA: Insufficient documentation

## 2015-03-08 DIAGNOSIS — R0789 Other chest pain: Secondary | ICD-10-CM | POA: Diagnosis present

## 2015-03-08 DIAGNOSIS — R0602 Shortness of breath: Secondary | ICD-10-CM | POA: Insufficient documentation

## 2015-03-08 LAB — TROPONIN I

## 2015-03-08 LAB — CBC
HCT: 39.1 % (ref 35.0–47.0)
Hemoglobin: 12.9 g/dL (ref 12.0–16.0)
MCH: 25.9 pg — ABNORMAL LOW (ref 26.0–34.0)
MCHC: 33 g/dL (ref 32.0–36.0)
MCV: 78.4 fL — ABNORMAL LOW (ref 80.0–100.0)
PLATELETS: 292 10*3/uL (ref 150–440)
RBC: 4.98 MIL/uL (ref 3.80–5.20)
RDW: 15 % — ABNORMAL HIGH (ref 11.5–14.5)
WBC: 7.3 10*3/uL (ref 3.6–11.0)

## 2015-03-08 LAB — BASIC METABOLIC PANEL
Anion gap: 9 (ref 5–15)
BUN: 13 mg/dL (ref 6–20)
CALCIUM: 9 mg/dL (ref 8.9–10.3)
CHLORIDE: 106 mmol/L (ref 101–111)
CO2: 23 mmol/L (ref 22–32)
CREATININE: 0.53 mg/dL (ref 0.44–1.00)
GFR calc Af Amer: >60 mL/min (ref >60)
GFR calc non Af Amer: >60 mL/min (ref >60)
Glucose, Bld: 113 mg/dL — ABNORMAL HIGH (ref 65–99)
Potassium: 3.7 mmol/L (ref 3.5–5.1)
Sodium: 138 mmol/L (ref 135–145)

## 2015-03-08 LAB — POCT PREGNANCY, URINE: PREG TEST UR: NEGATIVE

## 2015-03-08 MED ORDER — KETOROLAC TROMETHAMINE 60 MG/2ML IM SOLN
INTRAMUSCULAR | Status: AC
  Administered 2015-03-08: 60 mg via INTRAMUSCULAR
  Filled 2015-03-08: qty 2

## 2015-03-08 MED ORDER — KETOROLAC TROMETHAMINE 60 MG/2ML IM SOLN
60.0000 mg | Freq: Once | INTRAMUSCULAR | Status: AC
  Administered 2015-03-08: 60 mg via INTRAMUSCULAR

## 2015-03-08 NOTE — Discharge Instructions (Signed)
Dolor de la pared torcica  (Chest Wall Pain)  El dolor en la pared torcica se siente en la zona de los huesos y msculos del trax. Podrn pasar hasta 6 semanas hasta que comience a mejorar. Podra pasar ms tiempo si es Probation officeruna persona activa. Puede aparecer sin motivo. Otras veces, algunos factores como grmenes, lesiones, tos o ejercicios pueden Armed forces operational officercausar el dolor. CUIDADOS EN EL HOGAR   Evite las actividades que lo cansen o le causen Engineer, miningdolor. Trate de no usar los msculos del pecho, el vientre (abdomen) ni los msculos laterales. No levante objetos pesados.  Aplique hielo sobre la zona dolorida.  Ponga el hielo en una bolsa plstica.  Colquese una toalla entre la piel y la bolsa de hielo.  Deje el hielo durante 15 a 20 minutos durante los 2 primeros das.  Slo tome los medicamentos que le indique el mdico. SOLICITE AYUDA DE INMEDIATO SI:   Siente ms dolor o el dolor es muy molesto.  Tiene fiebre.  El dolor en el pecho Ponderempeora.  Tiene nuevos sntomas.  Tiene malestar estomacal (nuseas) ovmitos.  Si se siente sudoroso o mareado.  Tiene tos con mucosidad (flema).  Tose y escupe sangre. ASEGRESE DE QUE:   Comprende estas instrucciones.  Controlar su enfermedad.  Solicitar ayuda de inmediato si no mejora o si empeora. Document Released: 09/25/2011 Document Revised: 12/29/2011 [facility] Patient Information 2015 [place]. This information is not intended to replace advice given to you by your health care provider. Make sure you discuss any questions you have with your health care provider.  Please return for any problems, try ice or a heating pad whichever seems to help more but do not fall asleep with eith or them - you could get burns.   Take the toradol 1 pill 4 x a day with food.   [facility]

## 2015-03-08 NOTE — ED Notes (Signed)
Pt discharged home after verbalizing understanding of discharge instructions; nad noted. 

## 2015-03-08 NOTE — ED Provider Notes (Signed)
[facility] Emergency Department Provider Note  ____________________________________________  Time seen: Approximately 9:11 PM  I have reviewed the triage vital signs and the nursing notes.   HISTORY  Chief Complaint Chest Pain and Shortness of Breath   HPI Mandy Romero is a 19 y.o. female reports a little over a week ago she was lifting heavy boxes at work and began having pain in the back across the shoulders and the back of her neck this has progressed to pain running down the arm and pain in the front of the chest on the right side and involving the right arm is reports the pain is sometimes is so bad that it hurts to breathe and makes her short of breath. The patient is able to get enough air just that the pain almost takes her breath away the pain occasionally hurts very badly in her arm and interferes with her use of the arm as well as not had any actual weakness in the arm she says pressed closely patient is also having to lift her 7715-month-old baby who is very plump she reports the pain is worse with deep breathing movement or use of the arm better when she does not do any of those activities is not had this kind of pain before although she did have pain when she was younger when she used backpack the pain was in her back   History reviewed. No pertinent past medical history.  There are no active problems to display for this patient.   Past Surgical History  Procedure Laterality Date  . Cholecystectomy      No current outpatient prescriptions on file.  Allergies Review of patient's allergies indicates no known allergies.  No family history on file.  Social History History  Substance Use Topics  . Smoking status: Never Smoker   . Smokeless tobacco: Never Used  . Alcohol Use: No    Review of Systems Constitutional: No fever/chills Eyes: No visual changes. ENT: No sore throat. Gastrointestinal: No abdominal pain.  No nausea, no  vomiting.  No diarrhea.  No constipation. Genitourinary: Negative for dysuria. Musculoskeletal: Negative for back pain. Skin: Negative for rash.  He was systems is otherwise negative except for as mentioned in history of present illness  ____________________________________________   PHYSICAL EXAM:  VITAL SIGNS: ED Triage Vitals  Enc Vitals Group     BP 03/08/15 1934 112/62 mmHg     Pulse Rate 03/08/15 1934 66     Resp 03/08/15 1934 20     Temp 03/08/15 1934 98.3 F (36.8 C)     Temp Source 03/08/15 1934 Oral     SpO2 03/08/15 1934 99 %     Weight 03/08/15 1934 148 lb (67.132 kg)     Height 03/08/15 1934 5' (1.524 m)     Head Cir --      Peak Flow --      Pain Score 03/08/15 1942 8     Pain Loc --      Pain Edu? --      Excl. in GC? --     Constitutional: Alert and oriented. Well appearing and in no acute distress. Eyes: Conjunctivae are normal. PERRL. EOMI. Head: Atraumatic. Nose: No congestion/rhinnorhea. Mouth/Throat: Mucous membranes are moist.  Oropharynx non-erythematous. Neck: No stridor. Patient is tender across the base the neck and the back in the muscles she is also tender in the anterior chest wall palpation exactly reproduces her pain Cardiovascular: Normal rate, regular rhythm. Grossly  normal heart sounds.  Good peripheral circulation. Pulses in the wrist are normal and equal bilaterally Respiratory: Normal respiratory effort.  No retractions. Lungs CTAB. Gastrointestinal: Soft and nontender. No distention. No abdominal bruits. No CVA tenderness. Neurologic:  Normal speech and language. No gross focal neurologic deficits are appreciated. Speech is normal. No gait instability. Skin:  Skin is warm, dry and intact. No rash noted. Psychiatric: Mood and affect are normal. Speech and behavior are normal.  ____________________________________________   LABS (all labs ordered are listed, but only abnormal results are displayed)  Labs Reviewed  CBC - Abnormal;  Notable for the following:    MCV 78.4 (*)    MCH 25.9 (*)    RDW 15.0 (*)    All other components within normal limits  BASIC METABOLIC PANEL - Abnormal; Notable for the following:    Glucose, Bld 113 (*)    All other components within normal limits  TROPONIN I  POC URINE PREG, ED  POCT PREGNANCY, URINE   ____________________________________________  EKG  EKG shows normal sinus rhythm rate of 70 normal axis no acute changes ____________________________________________  RADIOLOGY  Chest x-ray is normal per radiology ____________________________________________   PROCEDURES  Procedure(s) performed: None  Critical Care performed: No  ____________________________________________   INITIAL IMPRESSION / ASSESSMENT AND PLAN / ED COURSE  Pertinent labs & imaging results that were available during my care of the patient were reviewed by me and considered in my medical decision making (see chart for details).  ____________________________________________   FINAL CLINICAL IMPRESSION(S) / ED DIAGNOSES  Final diagnoses:  Chest wall pain     Arnaldo NatalPaul F Holiday Mcmenamin, MD 03/08/15 2231

## 2015-03-08 NOTE — ED Notes (Signed)
Pt presents to ED with c/o right sided chest pain and shortness of breath for the past 3 days. States her "heart hurts". Occurred while lifting boxes at work; pain is reproducible with palpation. Pt reports that her pain is intermittent and sharp in nature and radiates down her right arm. Pt currently has no increased work of breathing or acute distress noted at this time. Pt alert and calm during triage.

## 2015-03-08 NOTE — ED Notes (Signed)
Pt c/o pain to neck and upper back; also to chest and arm. Pain is reproducible upon palpation - thinks she may have pulled something. Pt alert & oriented with NAD noted.

## 2015-10-19 ENCOUNTER — Emergency Department
Admission: EM | Admit: 2015-10-19 | Discharge: 2015-10-20 | Disposition: A | Discharge status: Home / Self Care | Payer: Medicaid Other | Attending: Emergency Medicine | Admitting: Emergency Medicine

## 2015-10-19 ENCOUNTER — Encounter: Payer: Self-pay | Admitting: Emergency Medicine

## 2015-10-19 DIAGNOSIS — J029 Acute pharyngitis, unspecified: Secondary | ICD-10-CM | POA: Insufficient documentation

## 2015-10-19 DIAGNOSIS — H9202 Otalgia, left ear: Secondary | ICD-10-CM | POA: Insufficient documentation

## 2015-10-19 DIAGNOSIS — R509 Fever, unspecified: Secondary | ICD-10-CM

## 2015-10-19 HISTORY — DX: Peritonsillar abscess: J36

## 2015-10-19 NOTE — ED Notes (Signed)
Pt with muffled voice, states left sided throat pain and ear pain. Swollen left tonsil noted. Pt with history of peritonsilar abscess in past with drainage.

## 2015-10-20 ENCOUNTER — Emergency Department: Payer: Medicaid Other

## 2015-10-20 LAB — BASIC METABOLIC PANEL
Anion gap: 7 (ref 5–15)
BUN: 11 mg/dL (ref 6–20)
CALCIUM: 9.3 mg/dL (ref 8.9–10.3)
CO2: 27 mmol/L (ref 22–32)
Chloride: 105 mmol/L (ref 101–111)
Creatinine, Ser: 0.55 mg/dL (ref 0.44–1.00)
Glucose, Bld: 91 mg/dL (ref 65–99)
Potassium: 3.9 mmol/L (ref 3.5–5.1)
Sodium: 139 mmol/L (ref 135–145)

## 2015-10-20 LAB — CBC WITH DIFFERENTIAL/PLATELET
BASOS PCT: 1 %
Basophils Absolute: 0.1 10*3/uL (ref 0–0.1)
EOS ABS: 0.3 10*3/uL (ref 0–0.7)
EOS PCT: 3 %
HCT: 42.1 % (ref 35.0–47.0)
Hemoglobin: 13.9 g/dL (ref 12.0–16.0)
Lymphocytes Relative: 25 %
Lymphs Abs: 2.8 10*3/uL (ref 1.0–3.6)
MCH: 27.2 pg (ref 26.0–34.0)
MCHC: 33.1 g/dL (ref 32.0–36.0)
MCV: 82.2 fL (ref 80.0–100.0)
MONO ABS: 0.7 10*3/uL (ref 0.2–0.9)
MONOS PCT: 6 %
Neutro Abs: 7.3 10*3/uL — ABNORMAL HIGH (ref 1.4–6.5)
Neutrophils Relative %: 65 %
PLATELETS: 274 10*3/uL (ref 150–440)
RBC: 5.12 MIL/uL (ref 3.80–5.20)
RDW: 14.1 % (ref 11.5–14.5)
WBC: 11.1 10*3/uL — ABNORMAL HIGH (ref 3.6–11.0)

## 2015-10-20 LAB — POCT RAPID STREP A: Streptococcus, Group A Screen (Direct): NEGATIVE

## 2015-10-20 MED ORDER — MAGIC MOUTHWASH
10.0000 mL | Freq: Once | ORAL | Status: AC
  Administered 2015-10-20: 10 mL via ORAL
  Filled 2015-10-20: qty 10

## 2015-10-20 MED ORDER — ACETAMINOPHEN 500 MG PO TABS
1000.0000 mg | ORAL_TABLET | Freq: Once | ORAL | Status: AC
  Administered 2015-10-20: 1000 mg via ORAL
  Filled 2015-10-20: qty 2

## 2015-10-20 MED ORDER — IOHEXOL 300 MG/ML  SOLN
75.0000 mL | Freq: Once | INTRAMUSCULAR | Status: AC | PRN
  Administered 2015-10-20: 75 mL via INTRAVENOUS

## 2015-10-20 MED ORDER — SODIUM CHLORIDE 0.9 % IV SOLN
3.0000 g | Freq: Once | INTRAVENOUS | Status: AC
  Administered 2015-10-20: 3 g via INTRAVENOUS
  Filled 2015-10-20: qty 3

## 2015-10-20 MED ORDER — AMOXICILLIN 500 MG PO CAPS: 500.0000 mg | ORAL_CAPSULE | Freq: Three times a day (TID) | ORAL | Status: DC

## 2015-10-20 MED ORDER — DEXAMETHASONE SODIUM PHOSPHATE 10 MG/ML IJ SOLN
10.0000 mg | Freq: Once | INTRAMUSCULAR | Status: AC
  Administered 2015-10-20: 10 mg via INTRAVENOUS
  Filled 2015-10-20: qty 1

## 2015-10-20 MED ORDER — MAGIC MOUTHWASH: 5.0000 mL | Freq: Three times a day (TID) | ORAL | Status: DC | PRN

## 2015-10-20 NOTE — ED Notes (Signed)
Pt discharged to home.  Discharge instructions reviewed.  No questions or concerns at this time.  Pt voiced understanding.  Teach back verified.  Pt in NAD.  No items left in ED.

## 2015-10-20 NOTE — ED Provider Notes (Signed)
[facility] Emergency Department Provider Note  ____________________________________________  Time seen: Approximately 12:08 AM  I have reviewed the triage vital signs and the nursing notes.   HISTORY  Chief Complaint Sore Throat; Otalgia; and Oral Swelling    HPI Mandy Romero is a 19 y.o. female who presents to the ED from home with a chief complaint of throat pain. Patient complains of a 2-3 day history of sore throat, now progressing to left sided throat, ear pain and swelling. Patient states she has a history of peritonsillar abscess 2-3 years ago which required drainage. Complains of associated fever. Denies cough, chest pain, shortness of breath, abdominal pain, nausea, vomiting, diarrhea, rash. Denies recent travel or trauma. Nothing makes her symptoms better or worse.   Past Medical History  Diagnosis Date  . Peritonsillar abscess     There are no active problems to display for this patient.   Past Surgical History  Procedure Laterality Date  . Cholecystectomy      No current outpatient prescriptions on file.  Allergies Review of patient's allergies indicates no known allergies.  No family history on file.  Social History Social History  Substance Use Topics  . Smoking status: Never Smoker   . Smokeless tobacco: Never Used  . Alcohol Use: No    Review of Systems Constitutional: Positive for fever/chills Eyes: No visual changes. ENT: Positive for sore throat. Cardiovascular: Denies chest pain. Respiratory: Denies shortness of breath. Gastrointestinal: No abdominal pain.  No nausea, no vomiting.  No diarrhea.  No constipation. Genitourinary: Negative for dysuria. Musculoskeletal: Negative for back pain. Skin: Negative for rash. Neurological: Negative for headaches, focal weakness or numbness.  10-point ROS otherwise negative.  ____________________________________________   PHYSICAL EXAM:  VITAL SIGNS: ED Triage  Vitals  Enc Vitals Group     BP 10/19/15 2318 122/63 mmHg     Pulse Rate 10/19/15 2318 96     Resp 10/19/15 2318 16     Temp 10/19/15 2318 101 F (38.3 C)     Temp Source 10/19/15 2318 Oral     SpO2 10/19/15 2318 100 %     Weight 10/19/15 2318 156 lb (70.761 kg)     Height 10/19/15 2318 5' (1.524 m)     Head Cir --      Peak Flow --      Pain Score 10/19/15 2319 9     Pain Loc --      Pain Edu? --      Excl. in GC? --     Constitutional: Alert and oriented. Well appearing and in no acute distress. Eyes: Conjunctivae are normal. PERRL. EOMI. Head: Atraumatic. Nose: No congestion/rhinnorhea. Mouth/Throat: Mucous membranes are moist.  Oropharynx moderately erythematous.  Bilateral tonsillar swelling. No exudate. Does not appear to be peritonsillar abscess on the left side. There is no hoarse voice. Voice is slightly muffled. There is no drooling. Patient is tolerating secretions well. Neck: No stridor.  No neck mass. Hematological/Lymphatic/Immunilogical: Shotty anterior cervical lymphadenopathy. Cardiovascular: Normal rate, regular rhythm. Grossly normal heart sounds.  Good peripheral circulation. Respiratory: Normal respiratory effort.  No retractions. Lungs CTAB. Gastrointestinal: Soft and nontender. No distention. No abdominal bruits. No CVA tenderness. Musculoskeletal: No lower extremity tenderness nor edema.  No joint effusions. Neurologic:  Normal speech and language. No gross focal neurologic deficits are appreciated. No gait instability. Skin:  Skin is warm, dry and intact. No rash noted. Psychiatric: Mood and affect are normal. Speech and behavior are normal.  ____________________________________________  LABS (all labs ordered are listed, but only abnormal results are displayed)  Labs Reviewed  CBC WITH DIFFERENTIAL/PLATELET - Abnormal; Notable for the following:    WBC 11.1 (*)    Neutro Abs 7.3 (*)    All other components within normal limits  CULTURE, BLOOD  (ROUTINE X 2)  CULTURE, BLOOD (ROUTINE X 2)  CULTURE, GROUP A STREP ([facility] ONLY)  BASIC METABOLIC PANEL  POCT RAPID STREP A   ____________________________________________  EKG  None ____________________________________________  RADIOLOGY  CT neck without contrast interpreted per Dr. Karie KirksBloomer: Palatine tonsillar hypertrophy without CT findings of acute tonsillitis or peritonsillar abscess. Patent airway.  Mild lymphadenopathy is likely reactive. ____________________________________________   PROCEDURES  Procedure(s) performed: None  Critical Care performed: No  ____________________________________________   INITIAL IMPRESSION / ASSESSMENT AND PLAN / ED COURSE  Pertinent labs & imaging results that were available during my care of the patient were reviewed by me and considered in my medical decision making (see chart for details).  19 year old female who presents with fever and sore throat, history of peritonsillar abscess. Physical examination does not reveal a clear and obvious left-sided peritonsillar abscess. Will check screening lab work, administer antipyretic, start IV Decadron and Unasyn; will obtain CT imaging of neck to evaluate for deep-seated neck infection.  ----------------------------------------- 3:17 AM on 10/20/2015 -----------------------------------------  Updated patient of CT results. Patient is afebrile and feels much improved. She is speaking with a more clear voice now. Will maintain antibiotics and follow up with her PCP next week. Strict return precautions given. Patient verbalizes understanding and agrees with plan of care. ____________________________________________   FINAL CLINICAL IMPRESSION(S) / ED DIAGNOSES  Final diagnoses:  Sore throat  Pharyngitis  Fever, unspecified fever cause      Irean HongJade J Chidiebere Wynn, MD 10/20/15 1244

## 2015-10-20 NOTE — Discharge Instructions (Signed)
1. Take antibiotic as prescribed (amoxicillin 500 mg 3 times daily 7 days). 2. Use Magic mouthwash as needed for throat discomfort. 3. Alternate Tylenol Motrin every 4 hours as needed for fever greater than 100.47F. 4. Return to the ER for worsening symptoms, persistent vomiting, difficulty breathing or other concerns.  [employer] (Fever, Adult) La fiebre es el aumento de la Arts development officer. A menudo se la define como una temperatura de 100F (38C) o ms. Por lo general, los episodios de fiebre leve o moderada que duran poco tiempo no tienen efectos a largo plazo y no requieren TEFL teacher. Los episodios de fiebre moderada o alta pueden ser [employer] y, a veces, un signo de una enfermedad grave. La sudoracin que se puede presentar cuando los episodios de fiebre son reiterados o prolongados tambin pueden derivar en deshidratacin. Para confirmar la presencia de fiebre, se toma la temperatura con un termmetro. La medicin de la temperatura puede variar en funcin de los siguientes factores:  La edad.  La hora del da.  El lugar donde se coloca el termmetro:  La boca (oral).  El recto (rectal).  El odo (timpnico).  Debajo del brazo Administrator, Civil Service).  La frente (temporal). INSTRUCCIONES PARA EL CUIDADO EN EL HOGAR Est atento a cualquier cambio en los sntomas. Tome estas medidas para controlar la afeccin:  Tome los medicamentos de venta libre y los recetados solamente como se lo haya indicado el mdico. Siga cuidadosamente las indicaciones en cuanto a la dosis.  Si le recetaron un antibitico, tmelo como se lo haya indicado el mdico. No deje de tomar los antibiticos aunque comience a Actor.  Descanse todo lo que sea necesario.  Beba suficiente lquido para Photographer orina clara o de color amarillo plido. Esto ayuda a Statistician.  Higiencese con Delma Freeze o dese un bao con agua a temperatura ambiente para ayudar a Immunologist, si es necesario. No use agua helada.  No se tape con demasiadas mantas ni use ropa abrigada. SOLICITE ATENCIN MDICA SI:  Vomita.  No puede comer ni beber sin vomitar.  Tiene diarrea.  Siente dolor al [employer].  Los sntomas no mejoran con Scientist, research (medical).  Presenta nuevos sntomas.  Est muy dbil. SOLICITE ATENCIN MDICA DE INMEDIATO SI:  Le falta el aire o tiene dificultad para respirar.  Est mareado o se desmaya.  Est desorientado o confundido.  Tiene signos de deshidratacin, como sequedad en la boca, disminucin de la cantidad de [place].  Siente dolor intenso en el abdomen.  Tiene nuseas o diarrea persistentes.  Tiene una erupcin cutnea.  Los sntomas empeoran repentinamente.   Esta informacin no tiene Theme park manager el consejo del mdico. Asegrese de hacerle al mdico cualquier pregunta que tenga.   Document Released: 07/16/2005 Document Revised: 06/27/2015 Elsevier Interactive Patient Education 2016 [employer].  Faringitis (Pharyngitis) La faringitis ocurre cuando la faringe presenta enrojecimiento, dolor e hinchazn (inflamacin).  CAUSAS  Normalmente, la faringitis se debe a una infeccin. Generalmente, estas infecciones ocurren debido a virus (viral) y se presentan cuando las personas se resfran. Sin embargo, a Advertising account executive faringitis es provocada por bacterias (bacteriana). Las alergias tambin pueden ser una causa de la faringitis. La faringitis viral se puede contagiar de Neomia Dear persona a otra al toser, estornudar y compartir objetos o utensilios personales (tazas, tenedores, cucharas, cepillos de diente). La faringitis bacteriana se puede contagiar de Neomia Dear persona a otra a travs de un contacto ms ntimo, como besar.  SIGNOS Y SNTOMAS  Los sntomas de la faringitis incluyen los siguientes:   Dolor de Advertising copywriter.  Cansancio (fatiga).  Fiebre no muy elevada.  Dolor de [place].  Dolores musculares y en las  articulaciones.  Erupciones cutneas  Ganglios linfticos hinchados.  Una pelcula parecida a las placas en la garganta o las amgdalas (frecuente con la faringitis bacteriana). DIAGNSTICO  El mdico le har preguntas sobre la enfermedad y sus sntomas. Normalmente, todo lo que se necesita para diagnosticar una faringitis son sus antecedentes mdicos y un examen fsico. A veces se realiza una prueba rpida para estreptococos. Tambin es posible que se realicen otros anlisis de laboratorio, segn la posible causa.  TRATAMIENTO  La faringitis viral normalmente mejorar en un plazo de 3 a 4das sin medicamentos. La faringitis bacteriana se trata con medicamentos que [employer] grmenes (antibiticos).  INSTRUCCIONES PARA EL CUIDADO EN EL HOGAR   Beba gran cantidad de lquido para mantener la orina de tono claro o color amarillo plido.  Tome solo medicamentos de venta libre o recetados, segn las indicaciones del mdico.  Si le receta antibiticos, asegrese de terminarlos, incluso si comienza a Actor.  No tome aspirina.  Descanse lo suficiente.  Hgase grgaras con 8onzas ( ) de agua con sal (cucharadita de sal por litro de agua) cada 1 o 2horas para Science writer.  Puede usar pastillas (si no corre riesgo de Health visitor) o aerosoles para Science writer. SOLICITE ATENCIN MDICA SI:   Tiene bultos grandes y dolorosos en el cuello.  Tiene una erupcin cutnea.  Cuando tose elimina una expectoracin verde, amarillo amarronado o con [place]. SOLICITE ATENCIN MDICA DE INMEDIATO SI:   El cuello se pone rgido.  Comienza a babear o no puede tragar lquidos.  Vomita o no puede retener los [employer] lquidos.  Siente un dolor intenso que no se alivia con los medicamentos recomendados.  Tiene dificultades para respirar (y no debido a la nariz tapada). ASEGRESE DE QUE:   Comprende estas instrucciones.  Controlar su afeccin.  Recibir ayuda  de inmediato si no mejora o si empeora.   Esta informacin no tiene Theme park manager el consejo del mdico. Asegrese de hacerle al mdico cualquier pregunta que tenga.   Document Released: 07/16/2005 Document Revised: 07/27/2013 Elsevier Interactive Patient Education 2016 [employer].  Dolor de garganta  (Sore Throat)  El dolor de garganta es el dolor, ardor, irritacin o sensacin de picazn en la garganta. Generalmente hay dolor o molestias al tragar o hablar. Un dolor de garganta puede estar acompaado de otros sntomas, como tos, estornudos, fiebre y ganglios hinchados en el cuello. Generalmente es Financial risk analyst signo de otra enfermedad, como un resfrio, gripe, anginas o mononucleosis (conocida como mono). La mayor parte de los dolores de garganta desaparecen sin tratamiento mdico. CAUSAS  Las causas ms comunes de dolor de garganta son:   Infecciones virales, como un resfrio, gripe o mononucleosis.  Infeccin bacteriana, como faringitis estreptoccica, amigdalitis, o tos ferina.  Alergias estacionales.  La sequedad en el aire.  Algunos irritantes, como el humo o la polucin.  Reflujo gastroesofgico. INSTRUCCIONES PARA EL CUIDADO EN EL HOGAR   Tome slo la medicacin que le indic el mdico.  Debe ingerir gran cantidad de lquido para mantener la orina de tono claro o color amarillo plido.  Descanse todo lo que sea necesario.  Trate de usar [employer] para la garganta, pastillas o chupe caramelos duros para Engineer, materials (si es mayor de 4 aos o  segn lo que le indiquen).  Beba lquidos calientes, como caldos, infusiones de hierbas o agua caliente con miel para calmar el dolor momentneamente. Tambin puede comer o beber lquidos fros o congelados tales como paletas de hielo congelado.  Haga grgaras con agua con sal (mezclar 1 cucharadita de sal en 8 onzas [250 cm3] de agua).  No fume, y evite el humo de otros fumadores.  Ponga un humidificador de vapor fro en la  habitacin por la noche para humedecer el aire. Tambin se puede activar en una ducha de agua caliente y sentarse en el bao con la puerta cerrada durante 5-10 minutos. SOLICITE ATENCIN MDICA DE INMEDIATO SI:   Tiene dificultad para respirar.  No puede tragar lquidos, alimentos blandos, o su saliva.  Usted tiene ms inflamacin en la garganta.  El dolor de garganta no mejora en [address] das.  Tiene nuseas o vmitos.  Tiene fiebre o sntomas que persisten durante ms de 2 o 3 das.  Tiene fiebre y los sntomas empeoran de manera sbita. ASEGRESE DE QUE:   Comprende estas instrucciones.  Controlar su enfermedad.  Solicitar ayuda de inmediato si no mejora o si empeora.   Esta informacin no tiene Theme park managercomo fin reemplazar el consejo del mdico. Asegrese de hacerle al mdico cualquier pregunta que tenga.   Document Released: 10/06/2005 Document Revised: 09/22/2012 Elsevier Interactive Patient Education [employer]2016 Elsevier Inc.

## 2015-10-22 LAB — CULTURE, GROUP A STREP (THRC)

## 2015-10-25 LAB — CULTURE, BLOOD (ROUTINE X 2)
CULTURE: NO GROWTH
Culture: NO GROWTH

## 2016-06-29 ENCOUNTER — Emergency Department: Payer: Medicaid Other

## 2016-06-29 ENCOUNTER — Encounter: Payer: Self-pay | Admitting: *Deleted

## 2016-06-29 ENCOUNTER — Emergency Department
Admission: EM | Admit: 2016-06-29 | Discharge: 2016-06-29 | Disposition: A | Discharge status: Home / Self Care | Payer: Medicaid Other | Attending: Emergency Medicine | Admitting: Emergency Medicine

## 2016-06-29 DIAGNOSIS — R109 Unspecified abdominal pain: Secondary | ICD-10-CM | POA: Diagnosis not present

## 2016-06-29 DIAGNOSIS — R102 Pelvic and perineal pain: Secondary | ICD-10-CM | POA: Diagnosis not present

## 2016-06-29 DIAGNOSIS — O26891 Other specified pregnancy related conditions, first trimester: Secondary | ICD-10-CM | POA: Insufficient documentation

## 2016-06-29 DIAGNOSIS — Z3490 Encounter for supervision of normal pregnancy, unspecified, unspecified trimester: Secondary | ICD-10-CM

## 2016-06-29 DIAGNOSIS — Z3A01 Less than 8 weeks gestation of pregnancy: Secondary | ICD-10-CM | POA: Diagnosis not present

## 2016-06-29 DIAGNOSIS — O26899 Other specified pregnancy related conditions, unspecified trimester: Secondary | ICD-10-CM

## 2016-06-29 LAB — URINALYSIS COMPLETE WITH MICROSCOPIC (ARMC ONLY)
Bacteria, UA: NONE SEEN
Bilirubin Urine: NEGATIVE
Glucose, UA: NEGATIVE mg/dL
Hgb urine dipstick: NEGATIVE
KETONES UR: NEGATIVE mg/dL
Nitrite: NEGATIVE
PH: 6 (ref 5.0–8.0)
PROTEIN: NEGATIVE mg/dL
Specific Gravity, Urine: 1.02 (ref 1.005–1.030)

## 2016-06-29 LAB — COMPREHENSIVE METABOLIC PANEL
ALT: 20 U/L (ref 14–54)
AST: 20 U/L (ref 15–41)
Albumin: 3.8 g/dL (ref 3.5–5.0)
Alkaline Phosphatase: 51 U/L (ref 38–126)
Anion gap: 5 (ref 5–15)
BILIRUBIN TOTAL: 0.1 mg/dL — AB (ref 0.3–1.2)
BUN: 5 mg/dL — AB (ref 6–20)
CALCIUM: 8.6 mg/dL — AB (ref 8.9–10.3)
CHLORIDE: 107 mmol/L (ref 101–111)
CO2: 22 mmol/L (ref 22–32)
CREATININE: 0.42 mg/dL — AB (ref 0.44–1.00)
GFR calc non Af Amer: >60 mL/min (ref >60)
Glucose, Bld: 101 mg/dL — ABNORMAL HIGH (ref 65–99)
Potassium: 3.4 mmol/L — ABNORMAL LOW (ref 3.5–5.1)
Sodium: 134 mmol/L — ABNORMAL LOW (ref 135–145)
Total Protein: 7.3 g/dL (ref 6.5–8.1)

## 2016-06-29 LAB — CBC
HCT: 39.6 % (ref 35.0–47.0)
Hemoglobin: 13.7 g/dL (ref 12.0–16.0)
MCH: 29.5 pg (ref 26.0–34.0)
MCHC: 34.7 g/dL (ref 32.0–36.0)
MCV: 84.9 fL (ref 80.0–100.0)
Platelets: 253 10*3/uL (ref 150–440)
RBC: 4.66 MIL/uL (ref 3.80–5.20)
RDW: 13.1 % (ref 11.5–14.5)
WBC: 9.5 10*3/uL (ref 3.6–11.0)

## 2016-06-29 LAB — LIPASE, BLOOD: LIPASE: 29 U/L (ref 11–51)

## 2016-06-29 LAB — ABO/RH: ABO/RH(D): O POS

## 2016-06-29 LAB — HCG, QUANTITATIVE, PREGNANCY: HCG, BETA CHAIN, QUANT, S: 121379 m[IU]/mL — AB (ref <5)

## 2016-06-29 LAB — POCT PREGNANCY, URINE: Preg Test, Ur: POSITIVE — AB

## 2016-06-29 NOTE — ED Notes (Signed)
Pt drinking water 

## 2016-06-29 NOTE — ED Triage Notes (Signed)
Pt reports abd pain with nausea and gas type pain. No vag bleeding.  No dysuria.  Pt took otc meds for gas without relief.  Pt alert.

## 2016-06-29 NOTE — ED Provider Notes (Signed)
[facility] Emergency Department Provider Note   ____________________________________________    I have reviewed the triage vital signs and the nursing notes.   HISTORY  Chief Complaint Abdominal Pain     HPI Clarann Aracelis Ulrey is a 20 y.o. female who presents with complaints of abdominal bloating over the last several days. She denies nausea or vomiting. She reports normal stools. No fevers or chills. She denies pain just feels "full. She denies dysuria. No back pain. No vaginal bleeding.   Past Medical History:  Diagnosis Date  . Peritonsillar abscess     There are no active problems to display for this patient.   Past Surgical History:  Procedure Laterality Date  . CHOLECYSTECTOMY      Prior to Admission medications   Medication Sig Start Date End Date Taking? Authorizing Provider  amoxicillin (AMOXIL) 500 MG capsule Take 1 capsule (500 mg total) by mouth 3 (three) times daily. Patient not taking: Reported on 06/29/2016 10/20/15   Irean Hong, MD  magic mouthwash SOLN Take 5 mLs by mouth 3 (three) times daily as needed for mouth pain. Patient not taking: Reported on 06/29/2016 10/20/15   Irean Hong, MD     Allergies Review of patient's allergies indicates no known allergies.  No family history on file.  Social History Social History  Substance Use Topics  . Smoking status: Never Smoker  . Smokeless tobacco: Never Used  . Alcohol use No    Review of Systems  Constitutional: No fever/chills  Cardiovascular: Denies chest pain. Respiratory: Denies Cough Gastrointestinal: As above   Genitourinary: Negative for dysuria. Musculoskeletal: Negative for back pain. Skin: Negative for rash. Neurological: Negative for headaches   10-point ROS otherwise negative.  ____________________________________________   PHYSICAL EXAM:  VITAL SIGNS: ED Triage Vitals [06/29/16 1937]  Enc Vitals Group     BP 110/60     Pulse Rate 72       Resp 18     Temp 98.3 F (36.8 C)     Temp Source Oral     SpO2 100 %     Weight 170 lb (77.1 kg)     Height 5' (1.524 m)     Head Circumference      Peak Flow      Pain Score 8     Pain Loc      Pain Edu?      Excl. in GC?     Constitutional: Alert and oriented. No acute distress. Pleasant and interactive Eyes: Conjunctivae are normal.  Head: Atraumatic. Nose: No congestion/rhinnorhea. Mouth/Throat: Mucous membranes are moist.    Cardiovascular: Normal rate, regular rhythm. Grossly normal heart sounds.   Respiratory: Normal respiratory effort.  No retractions. Gastrointestinal: Soft and nontender. No distention.  No CVA tenderness. Genitourinary: deferred Musculoskeletal: No lower extremity tenderness nor edema.  Warm and well perfused Neurologic:  Normal speech and language. No gross focal neurologic deficits are appreciated.  Skin:  Skin is warm, dry and intact. No rash noted. Psychiatric: Mood and affect are normal. Speech and behavior are normal.  ____________________________________________   LABS (all labs ordered are listed, but only abnormal results are displayed)  Labs Reviewed  COMPREHENSIVE METABOLIC PANEL - Abnormal; Notable for the following:       Result Value   Sodium 134 (*)    Potassium 3.4 (*)    Glucose, Bld 101 (*)    BUN 5 (*)    Creatinine, Ser 0.42 (*)  Calcium 8.6 (*)    Total Bilirubin 0.1 (*)    All other components within normal limits  URINALYSIS COMPLETEWITH MICROSCOPIC ([facility] ONLY) - Abnormal; Notable for the following:    Color, Urine YELLOW (*)    APPearance HAZY (*)    Leukocytes, UA 3+ (*)    Squamous Epithelial / LPF 6-30 (*)    All other components within normal limits  HCG, QUANTITATIVE, PREGNANCY - Abnormal; Notable for the following:    hCG, Beta Chain, Quant, S 121,379 (*)    All other components within normal limits  POCT PREGNANCY, URINE - Abnormal; Notable for the following:    Preg Test, Ur POSITIVE (*)     All other components within normal limits  LIPASE, BLOOD  CBC  POC URINE PREG, ED  ABO/RH   ____________________________________________  EKG  None ____________________________________________  RADIOLOGY  Ultrasound shows 8 week IUP ____________________________________________   PROCEDURES  Procedure(s) performed: No    Critical Care performed: No ____________________________________________   INITIAL IMPRESSION / ASSESSMENT AND PLAN / ED COURSE  Pertinent labs & imaging results that were available during my care of the patient were reviewed by me and considered in my medical decision making (see chart for details).  Informed patient of positive pregnancy test, she was not aware. She isn't clear how far along she is. Given that she is having vague abdominal discomfort we'll obtain ultrasound, no vaginal bleeding.  Clinical Course  Ultrasound shows a week IUP. Discussed with patient the need for follow-up with GYN, prenatal vitamins and to avoid alcohol and smoking. ____________________________________________   FINAL CLINICAL IMPRESSION(S) / ED DIAGNOSES  Final diagnoses:  Pregnancy  Abdominal pain in pregnancy      NEW MEDICATIONS STARTED DURING THIS VISIT:  New Prescriptions   No medications on file     Note:  This document was prepared using Dragon voice recognition software and may include unintentional dictation errors.    Jene Everyobert Tyrene Nader, MD 06/29/16 [phone]

## 2016-08-28 ENCOUNTER — Encounter: Payer: Self-pay | Admitting: Obstetrics and Gynecology

## 2016-08-28 ENCOUNTER — Ambulatory Visit (INDEPENDENT_AMBULATORY_CARE_PROVIDER_SITE_OTHER): Payer: Medicaid Other | Admitting: Obstetrics and Gynecology

## 2016-08-28 VITALS — BP 96/66 | HR 76 | Wt 171.1 lb

## 2016-08-28 DIAGNOSIS — O09212 Supervision of pregnancy with history of pre-term labor, second trimester: Secondary | ICD-10-CM

## 2016-08-28 DIAGNOSIS — Z3482 Encounter for supervision of other normal pregnancy, second trimester: Secondary | ICD-10-CM

## 2016-08-28 DIAGNOSIS — O0932 Supervision of pregnancy with insufficient antenatal care, second trimester: Secondary | ICD-10-CM

## 2016-08-28 DIAGNOSIS — E669 Obesity, unspecified: Secondary | ICD-10-CM

## 2016-08-28 DIAGNOSIS — O09892 Supervision of other high risk pregnancies, second trimester: Secondary | ICD-10-CM

## 2016-08-28 NOTE — Progress Notes (Signed)
OBSTETRIC INITIAL PRENATAL VISIT  Subjective:    Mandy Romero is being seen today for her first obstetrical visit.  This is not a planned pregnancy. She is a [id number] female at 2629w5d gestation, Estimated Date of Delivery: 02/07/17 with Patient's last menstrual period was 05/24/2016 (approximate), consistent with 8 week sono performed in ER. Her obstetrical history is significant for preterm delivery x 2. Relationship with FOB: significant other, living together. Patient does intend to breast feed. Pregnancy history fully reviewed.   Obstetric History   G3   P2   T0   P2   A0   L2    SAB0   TAB0   Ectopic0   Multiple0   Live Births2     # Outcome Date GA Lbr Len/2nd Weight Sex Delivery Anes PTL Lv  3 Current           2 Preterm 2016 4252w0d  6 lb 12 oz (3.062 kg) M Vag-Spont None  LIV  1 Preterm 2013 4971w0d  6 lb 11 oz (3.033 kg) M Vag-Spont None  LIV      Gynecologic History:  Last pap smear was 2016 (performed at health department).  Results were normal.  Denies/admits h/o abnormal pap smears in the past.  Denies history of STIs.    Past Medical History:  Diagnosis Date  . Migraine   . Peritonsillar abscess      No family history on file.   Past Surgical History:  Procedure Laterality Date  . CHOLECYSTECTOMY       Social History   Social History  . Marital status: Single    Spouse name: N/A  . Number of children: N/A  . Years of education: N/A   Occupational History  . Not on file.   Social History Main Topics  . Smoking status: Never Smoker  . Smokeless tobacco: Never Used  . Alcohol use No  . Drug use: No  . Sexual activity: Yes    Birth control/ protection: None   Other Topics Concern  . Not on file   Social History Narrative  . No narrative on file     No current outpatient prescriptions on file prior to visit.   No current facility-administered medications on file prior to visit.     No Known Allergies   Review of  Systems General:Not Present- Fever, Weight Loss and Weight Gain. Skin:Not Present- Rash. HEENT:Not Present- Blurred Vision, Headache and Bleeding Gums. Respiratory:Not Present- Difficulty Breathing. Breast:Not Present- Breast Mass. Cardiovascular:Not Present- Chest Pain, Elevated Blood Pressure, Fainting / Blacking Out and Shortness of Breath. Gastrointestinal:Not Present- Abdominal Pain, Constipation, Nausea and Vomiting. Female Genitourinary:Not Present- Frequency, Painful Urination, Pelvic Pain, Vaginal Bleeding, Vaginal Discharge, Contractions, regular, Fetal Movements Decreased, Urinary Complaints and Vaginal Fluid. Musculoskeletal:Not Present- Back Pain and Leg Cramps. Neurological:Not Present- Dizziness. Psychiatric:Not Present- Depression.     Objective:   Blood pressure 96/66, pulse 76, weight 171 lb 1.6 oz (77.6 kg), last menstrual period 05/24/2016.  Body mass index is 33.42 kg/m.   General Appearance:    Alert, cooperative, no distress, appears stated age, mild obesity  Head:    Normocephalic, without obvious abnormality, atraumatic  Eyes:    PERRL, conjunctiva/corneas clear, EOM's intact, both eyes  Ears:    Normal external ear canals, both ears  Nose:   Nares normal, septum midline, mucosa normal, no drainage or sinus tenderness  Throat:   Lips, mucosa, and tongue normal; teeth and gums normal  Neck:   Supple, symmetrical,  trachea midline, no adenopathy; thyroid: no enlargement/tenderness/nodules; no carotid bruit or JVD  Back:     Symmetric, no curvature, ROM normal, no CVA tenderness  Lungs:     Clear to auscultation bilaterally, respirations unlabored  Chest Wall:    No tenderness or deformity   Heart:    Regular rate and rhythm, S1 and S2 normal, no murmur, rub or gallop  Breast Exam:    No tenderness, masses, or nipple abnormality  Abdomen:     Soft, non-tender, bowel sounds active all four quadrants, no masses, no organomegaly.  FH 17.  FHT 157  bpm.   Genitalia:    Pelvic:external genitalia normal, vagina without lesions, discharge, or tenderness, rectovaginal septum  normal. Cervix normal in appearance, no cervical motion tenderness, no adnexal masses or tenderness.  Pregnancy positive findings: uterine enlargement: 17 wk size, nontender.   Rectal:    Normal external sphincter.  No hemorrhoids appreciated. Internal exam not done.   Extremities:   Extremities normal, atraumatic, no cyanosis or edema  Pulses:   2+ and symmetric all extremities  Skin:   Skin color, texture, turgor normal, no rashes or lesions  Lymph nodes:   Cervical, supraclavicular, and axillary nodes normal  Neurologic:   CNII-XII intact, normal strength, sensation and reflexes throughout      Assessment:   Pregnancy at 16 and 5/7 weeks   [facility] Obesity (Class I)  Plan:   1. Pregnancy at 16 and 5/7 weeks with late entry to care -  Initial labs ordered. - Prenatal vitamins encouraged. - Problem list reviewed and updated. - New OB counseling:  The patient has been given an overview regarding routine prenatal care.   - Prenatal testing, optional genetic testing, and ultrasound use in pregnancy were reviewed.  AFP4 discussed: undecided. - Benefits of Breast Feeding were discussed. The patient is encouraged to consider nursing her baby post partum. - Patient does report [facility] (but did not receive care there). Will request records.  - For anatomy scan next visit and flu vaccine - Pap smear up to date.   2. H/o preterm delivery x 2.  - Based on patient's history of births, appears that she had preterm labor with delivery (no suspicion of incompetent cervix).  Offered 17-OHP to help with prevention of preterm delivery.  Patient desires to think about it.   3. Obesity - Patient will need early glucola.  To perform within 1 week.  - Needs to begin daily baby aspirin.  Will  prescribe.  - Recommendations regarding diet, weight gain, and exercise in pregnancy were given.  To gain no more than 25-30 lbs this pregnancy.    Follow up in 4 weeks.  50% of 30 min visit spent on counseling and coordination of care.     Hildred LaserAnika Porcha Deblanc, MD [facility]

## 2016-08-29 LAB — DRUG SCREEN 764883 11+OXYCO+ALC+CRT-BUND
AMPHETAMINES, URINE: NEGATIVE ng/mL
BARBITURATE: NEGATIVE ng/mL
BENZODIAZ UR QL: NEGATIVE ng/mL
CREATININE: 154.6 mg/dL (ref 20.0–300.0)
Cannabinoid Quant, Ur: NEGATIVE ng/mL
Cocaine (Metabolite): NEGATIVE ng/mL
Ethanol: NEGATIVE %
METHADONE SCREEN, URINE: NEGATIVE ng/mL
Meperidine: NEGATIVE ng/mL
OPIATE SCREEN URINE: NEGATIVE ng/mL
Oxycodone/Oxymorphone, Urine: NEGATIVE ng/mL
PH OF URINE: 6.8 (ref 4.5–8.9)
Phencyclidine: NEGATIVE ng/mL
Propoxyphene: NEGATIVE ng/mL
Tramadol: NEGATIVE ng/mL

## 2016-08-29 LAB — URINALYSIS, MICROSCOPIC ONLY: Casts: NONE SEEN /lpf

## 2016-08-30 DIAGNOSIS — Z2839 Other underimmunization status: Secondary | ICD-10-CM | POA: Insufficient documentation

## 2016-08-30 DIAGNOSIS — O09899 Supervision of other high risk pregnancies, unspecified trimester: Secondary | ICD-10-CM | POA: Insufficient documentation

## 2016-08-30 DIAGNOSIS — Z283 Underimmunization status: Secondary | ICD-10-CM

## 2016-08-30 DIAGNOSIS — E669 Obesity, unspecified: Secondary | ICD-10-CM | POA: Insufficient documentation

## 2016-08-30 LAB — CBC
HEMATOCRIT: 37.9 % (ref 34.0–46.6)
Hemoglobin: 12.8 g/dL (ref 11.1–15.9)
MCH: 28.6 pg (ref 26.6–33.0)
MCHC: 33.8 g/dL (ref 31.5–35.7)
MCV: 85 fL (ref 79–97)
Platelets: 284 10*3/uL (ref 150–379)
RBC: 4.48 x10E6/uL (ref 3.77–5.28)
RDW: 13.3 % (ref 12.3–15.4)
WBC: 8.5 10*3/uL (ref 3.4–10.8)

## 2016-08-30 LAB — TSH: TSH: 2.31 u[IU]/mL (ref 0.450–4.500)

## 2016-08-30 LAB — RUBELLA SCREEN: RUBELLA: 1.58 {index} (ref >0.99)

## 2016-08-30 LAB — HEPATITIS B SURFACE ANTIGEN: HEP B S AG: NEGATIVE

## 2016-08-30 LAB — VARICELLA ZOSTER ANTIBODY, IGG: VARICELLA: 145 {index} — AB (ref >165)

## 2016-08-30 LAB — CULTURE, OB URINE

## 2016-08-30 LAB — GC/CHLAMYDIA PROBE AMP
Chlamydia trachomatis, NAA: NEGATIVE
NEISSERIA GONORRHOEAE BY PCR: NEGATIVE

## 2016-08-30 LAB — URINE CULTURE, OB REFLEX

## 2016-08-30 LAB — HEMOGLOBIN A1C
ESTIMATED AVERAGE GLUCOSE: 97 mg/dL
HEMOGLOBIN A1C: 5 % (ref 4.8–5.6)

## 2016-08-30 LAB — RPR: RPR Ser Ql: NONREACTIVE

## 2016-08-30 LAB — HIV ANTIBODY (ROUTINE TESTING W REFLEX): HIV Screen 4th Generation wRfx: NONREACTIVE

## 2016-09-01 ENCOUNTER — Encounter: Payer: Self-pay | Admitting: Obstetrics and Gynecology

## 2016-09-01 DIAGNOSIS — O09213 Supervision of pregnancy with history of pre-term labor, third trimester: Secondary | ICD-10-CM

## 2016-09-01 DIAGNOSIS — O0932 Supervision of pregnancy with insufficient antenatal care, second trimester: Secondary | ICD-10-CM | POA: Insufficient documentation

## 2016-09-01 DIAGNOSIS — O09893 Supervision of other high risk pregnancies, third trimester: Secondary | ICD-10-CM | POA: Insufficient documentation

## 2016-09-02 ENCOUNTER — Telehealth: Payer: Self-pay | Admitting: Obstetrics and Gynecology

## 2016-09-02 NOTE — Telephone Encounter (Signed)
Contacted patient to discuss that based on her h/o preterm deliveries, she is a candidate for 17-OHP injections.  Left message for patient to return call.

## 2016-09-10 ENCOUNTER — Other Ambulatory Visit: Payer: Medicaid Other

## 2016-09-25 ENCOUNTER — Ambulatory Visit (INDEPENDENT_AMBULATORY_CARE_PROVIDER_SITE_OTHER): Payer: Medicaid Other | Admitting: Obstetrics and Gynecology

## 2016-09-25 ENCOUNTER — Ambulatory Visit (INDEPENDENT_AMBULATORY_CARE_PROVIDER_SITE_OTHER): Payer: Medicaid Other

## 2016-09-25 VITALS — BP 96/61 | HR 91 | Wt 172.8 lb

## 2016-09-25 DIAGNOSIS — O219 Vomiting of pregnancy, unspecified: Secondary | ICD-10-CM

## 2016-09-25 DIAGNOSIS — E669 Obesity, unspecified: Secondary | ICD-10-CM

## 2016-09-25 DIAGNOSIS — Z3482 Encounter for supervision of other normal pregnancy, second trimester: Secondary | ICD-10-CM | POA: Diagnosis not present

## 2016-09-25 DIAGNOSIS — R0989 Other specified symptoms and signs involving the circulatory and respiratory systems: Secondary | ICD-10-CM

## 2016-09-25 DIAGNOSIS — B852 Pediculosis, unspecified: Secondary | ICD-10-CM

## 2016-09-25 DIAGNOSIS — O09212 Supervision of pregnancy with history of pre-term labor, second trimester: Secondary | ICD-10-CM

## 2016-09-25 DIAGNOSIS — B850 Pediculosis due to Pediculus humanus capitis: Secondary | ICD-10-CM

## 2016-09-25 DIAGNOSIS — O09892 Supervision of other high risk pregnancies, second trimester: Secondary | ICD-10-CM

## 2016-09-25 LAB — POCT URINALYSIS DIPSTICK
Bilirubin, UA: NEGATIVE
Glucose, UA: NEGATIVE
Ketones, UA: NEGATIVE
NITRITE UA: NEGATIVE
PROTEIN UA: NEGATIVE
SPEC GRAV UA: 1.02
UROBILINOGEN UA: NEGATIVE
pH, UA: 7.5

## 2016-09-25 MED ORDER — ASPIRIN EC 81 MG PO TBEC: 81.0000 mg | DELAYED_RELEASE_TABLET | Freq: Every day | ORAL | 2 refills | Status: DC

## 2016-09-25 MED ORDER — PERMETHRIN 1 % EX LIQD: Freq: Once | CUTANEOUS | 1 refills | Status: AC

## 2016-09-28 DIAGNOSIS — O219 Vomiting of pregnancy, unspecified: Secondary | ICD-10-CM | POA: Insufficient documentation

## 2016-09-28 NOTE — Progress Notes (Signed)
ROB: Patient c/o lingering productive cough. Is taking Tylenol with no relief. Advised on Robitussin and/or Mucinex  Was noted by one of staff that one of her children had head lice, patient thinks she may have it as well.  Recommended treatment with Nix for both her and family members.  Advised on use of daily baby aspirin.  Patient still has not completed early glucola, unable to stay today.  Also still with nausea/vomiting of pregnancy.  Will return in 4 weeks, to perform at that time. Patient declines 17-OHP for h/o preterm deliveries. S/p normal anatomy scan today. For flu vaccine next visit.

## 2016-10-20 NOTE — L&D Delivery Note (Signed)
      Delivery Note   Jeffrey Genie Wenke is a 21 y.o. [id number] at [redacted]w[redacted]d Estimated Date of Delivery: 02/07/17  PRE-OPERATIVE DIAGNOSIS:  1) [redacted]w[redacted]d pregnancy.   POST-OPERATIVE DIAGNOSIS:  1) [redacted]w[redacted]d pregnancy s/p Vaginal, Spontaneous Delivery   Delivery Type: Vaginal, Spontaneous Delivery    Delivery Clinician: Gunnar Bulla   Delivery Anesthesia: Other    Labor Complications:     Additional complications: Retained placenta  ESTIMATED BLOOD LOSS: 250  ml    FINDINGS:   1) female infant, Apgar scores of 8    at 1 minute and 9    at 5 minutes and a birthweight of 111.46  ounces.    2) Nuchal cord: No  SPECIMENS:   PLACENTA:   Appearance: Abnormal    Removal: Manual removal;Retained      Disposition:     DISPOSITION:  Infant to left in stable condition in the delivery room, with L&D personnel and mother,  NARRATIVE SUMMARY: Labor course:  Ms. Shoshannah Faubert is a [id number] at [redacted]w[redacted]d who presented for labor management.  She progressed well in labor without pitocin.  She received the appropriate anesthesia and proceeded to complete dilation. She evidenced good maternal expulsive effort during the second stage. She went on to deliver a viable infant. A perineal and vaginal examination was performed. Episiotomy/Lacerations:   None  The placenta did not release from the uterine wall within 30 minutes. The patient was given 1 mg of IV Stadol and the manual removal of placenta was performed with some difficulty. Examination of the placenta immediately after removal showed it appeared to be intact with a possible small area noted that could have been intact or could have been missing a small cotyledon. The anterior cervix was grasped with ring forceps and a banjo curet was passed into the endometrial cavity and a gentle curettage was performed. No additional placental tissue was encountered. The patient's uterus remained firm and her bleeding was noted to be scant.  Elonda Husky, M.D. 01/28/2017 11:59 AM

## 2016-10-23 ENCOUNTER — Other Ambulatory Visit: Payer: Medicaid Other

## 2016-10-23 ENCOUNTER — Encounter: Payer: Medicaid Other | Admitting: Obstetrics and Gynecology

## 2016-10-28 ENCOUNTER — Ambulatory Visit (INDEPENDENT_AMBULATORY_CARE_PROVIDER_SITE_OTHER): Payer: Medicaid Other | Admitting: Obstetrics and Gynecology

## 2016-10-28 ENCOUNTER — Other Ambulatory Visit: Payer: Medicaid Other

## 2016-10-28 ENCOUNTER — Encounter: Payer: Medicaid Other | Admitting: Obstetrics and Gynecology

## 2016-10-28 VITALS — BP 98/64 | HR 90 | Wt 174.7 lb

## 2016-10-28 DIAGNOSIS — Z130 Encounter for screening for diseases of the blood and blood-forming organs and certain disorders involving the immune mechanism: Secondary | ICD-10-CM

## 2016-10-28 DIAGNOSIS — O99212 Obesity complicating pregnancy, second trimester: Secondary | ICD-10-CM

## 2016-10-28 DIAGNOSIS — Z23 Encounter for immunization: Secondary | ICD-10-CM

## 2016-10-28 DIAGNOSIS — Z3482 Encounter for supervision of other normal pregnancy, second trimester: Secondary | ICD-10-CM

## 2016-10-28 DIAGNOSIS — R399 Unspecified symptoms and signs involving the genitourinary system: Secondary | ICD-10-CM

## 2016-10-28 LAB — POCT URINALYSIS DIPSTICK
BILIRUBIN UA: NEGATIVE
Blood, UA: NEGATIVE
Glucose, UA: NEGATIVE
KETONES UA: NEGATIVE
Nitrite, UA: NEGATIVE
SPEC GRAV UA: 1.02
Urobilinogen, UA: NEGATIVE
pH, UA: 7

## 2016-10-28 NOTE — Progress Notes (Deleted)
ROB

## 2016-10-28 NOTE — Progress Notes (Signed)
ROB: Complains of migraines (left eye pain, tearing), not relieved with Tylenol. Advised on Exedrin migraine.  Also notes UTI symptoms. UA only with leukocytes, but will send culture. Advised on Azo and increased hydration. For flu vaccine today. RTC in 4 weeks. For glucola prior next visit (patient was late today, could not completed early glucola). For 28 week labs next visit.

## 2016-10-29 LAB — URINE CULTURE

## 2016-11-13 ENCOUNTER — Other Ambulatory Visit: Payer: Self-pay | Admitting: Obstetrics and Gynecology

## 2016-11-13 ENCOUNTER — Telehealth: Payer: Self-pay | Admitting: Obstetrics and Gynecology

## 2016-11-13 ENCOUNTER — Ambulatory Visit (INDEPENDENT_AMBULATORY_CARE_PROVIDER_SITE_OTHER): Payer: Medicaid Other | Admitting: Obstetrics and Gynecology

## 2016-11-13 VITALS — BP 100/56 | HR 85 | Wt 179.1 lb

## 2016-11-13 DIAGNOSIS — Z3492 Encounter for supervision of normal pregnancy, unspecified, second trimester: Secondary | ICD-10-CM

## 2016-11-13 DIAGNOSIS — O2686 Pruritic urticarial papules and plaques of pregnancy (PUPPP): Secondary | ICD-10-CM

## 2016-11-13 LAB — POCT URINALYSIS DIPSTICK
Bilirubin, UA: NEGATIVE
Glucose, UA: NEGATIVE
KETONES UA: NEGATIVE
LEUKOCYTES UA: NEGATIVE
Nitrite, UA: NEGATIVE
PH UA: 7
RBC UA: NEGATIVE
SPEC GRAV UA: 1.015
Urobilinogen, UA: 0.2

## 2016-11-13 MED ORDER — CLOBETASOL PROPIONATE 0.05 % EX OINT: 1.0000 "application " | TOPICAL_OINTMENT | Freq: Two times a day (BID) | CUTANEOUS | 2 refills | Status: DC

## 2016-11-13 MED ORDER — PREDNISOLONE 5 MG (48) PO TBPK: 6.0000 | ORAL_TABLET | Freq: Every day | ORAL | 1 refills | Status: DC

## 2016-11-13 MED ORDER — HYDROXYZINE HCL 25 MG PO TABS: 25.0000 mg | ORAL_TABLET | Freq: Three times a day (TID) | ORAL | 0 refills | Status: DC | PRN

## 2016-11-13 NOTE — Progress Notes (Signed)
Work in [employer]- Reports sudden onset red raised itchy rash on left side at bra line that quickly spread to around trunk, now on back and abdomen. Severe itching and burning. Denies h/o this type rash in past or others in house with similar rash, no fever, or other symptoms. Denies any changes in laundry detergent, soaps, or lotions. Did use Aveeno lotion with some relief.  O: diffuse papules noted under both arms in bra line, wrapping around trunk bilaterally, on abdomen and groin, no drainage noted, slightly warm to touch.  A: PUPPS Differential diagnosis: atopic dermatitis, herpes gestatonis P: labs obtained RX sent in for pred-pak and clobetasol cream and instructed on use. Atarax sent in also. RTC 1 week for recheck

## 2016-11-13 NOTE — Telephone Encounter (Signed)
Pt to come at 2:45pm for a workin appt with [facility] for rash.

## 2016-11-13 NOTE — Progress Notes (Signed)
OB WORK IN- pt c/o itchy rash all over abdomen x 1 day

## 2016-11-13 NOTE — Telephone Encounter (Signed)
Pt called and she stated that it started yesterday a rash started on the side of her breast and now this morning it is all over the front of her abdomen and breast and toward her arm pits. She is 30-[redacted] weeks pregnant. Itching and burning. Pt wanted a call back

## 2016-11-18 ENCOUNTER — Telehealth: Payer: Self-pay | Admitting: *Deleted

## 2016-11-18 NOTE — Telephone Encounter (Signed)
-----   Message from Melody N Shambley, CNM sent at 11/17/2016 10:23 AM EST ----- Please let her know labs are consistent with PUPPS and see if she is feeling any better 

## 2016-11-18 NOTE — Telephone Encounter (Signed)
Notified pt. 

## 2016-11-19 LAB — SEDIMENTATION RATE: Sed Rate: 35 mm/hr — ABNORMAL HIGH (ref 0–32)

## 2016-11-19 LAB — HEPATIC FUNCTION PANEL
ALBUMIN: 3.3 g/dL — AB (ref 3.5–5.5)
ALT: 15 IU/L (ref 0–32)
AST: 12 IU/L (ref 0–40)
Alkaline Phosphatase: 93 IU/L (ref 39–117)
Bilirubin Total: <0.2 mg/dL (ref 0.0–1.2)
Bilirubin, Direct: 0.04 mg/dL (ref 0.00–0.40)
Total Protein: 6.5 g/dL (ref 6.0–8.5)

## 2016-11-19 LAB — CBC
HEMATOCRIT: 32.7 % — AB (ref 34.0–46.6)
Hemoglobin: 10.5 g/dL — ABNORMAL LOW (ref 11.1–15.9)
MCH: 25.5 pg — ABNORMAL LOW (ref 26.6–33.0)
MCHC: 32.1 g/dL (ref 31.5–35.7)
MCV: 80 fL (ref 79–97)
PLATELETS: 313 10*3/uL (ref 150–379)
RBC: 4.11 x10E6/uL (ref 3.77–5.28)
RDW: 14.1 % (ref 12.3–15.4)
WBC: 6.6 10*3/uL (ref 3.4–10.8)

## 2016-11-19 LAB — HLA B*5701: RESULT: NEGATIVE

## 2016-11-20 ENCOUNTER — Telehealth: Payer: Self-pay | Admitting: *Deleted

## 2016-11-20 LAB — HLA DR15: HLA DR15: NEGATIVE

## 2016-11-20 LAB — SOLUBLE LIVER AG (IGG AB): Anti-SLA, IgG: 1.7 units (ref 0.0–20.0)

## 2016-11-20 NOTE — Telephone Encounter (Signed)
Notified pt of results, she was doing much better

## 2016-11-20 NOTE — Telephone Encounter (Signed)
-----   Message from Purcell NailsMelody N Shambley, [place] sent at 11/17/2016 10:23 AM EST ----- Please let her know labs are consistent with PUPPS and see if she is feeling any better

## 2016-11-21 ENCOUNTER — Encounter: Payer: Medicaid Other | Admitting: Obstetrics and Gynecology

## 2016-11-27 ENCOUNTER — Other Ambulatory Visit: Payer: Medicaid Other

## 2016-11-27 ENCOUNTER — Ambulatory Visit (INDEPENDENT_AMBULATORY_CARE_PROVIDER_SITE_OTHER): Payer: Medicaid Other | Admitting: Obstetrics and Gynecology

## 2016-11-27 VITALS — BP 118/73 | HR 0 | Wt 180.5 lb

## 2016-11-27 DIAGNOSIS — Z3482 Encounter for supervision of other normal pregnancy, second trimester: Secondary | ICD-10-CM

## 2016-11-27 DIAGNOSIS — Z3483 Encounter for supervision of other normal pregnancy, third trimester: Secondary | ICD-10-CM

## 2016-11-27 DIAGNOSIS — Z23 Encounter for immunization: Secondary | ICD-10-CM

## 2016-11-27 DIAGNOSIS — R51 Headache: Secondary | ICD-10-CM

## 2016-11-27 DIAGNOSIS — O26899 Other specified pregnancy related conditions, unspecified trimester: Secondary | ICD-10-CM | POA: Insufficient documentation

## 2016-11-27 DIAGNOSIS — R519 Headache, unspecified: Secondary | ICD-10-CM

## 2016-11-27 DIAGNOSIS — O26892 Other specified pregnancy related conditions, second trimester: Secondary | ICD-10-CM

## 2016-11-27 DIAGNOSIS — Z130 Encounter for screening for diseases of the blood and blood-forming organs and certain disorders involving the immune mechanism: Secondary | ICD-10-CM

## 2016-11-27 DIAGNOSIS — Z131 Encounter for screening for diabetes mellitus: Secondary | ICD-10-CM

## 2016-11-27 LAB — POCT URINALYSIS DIPSTICK
Bilirubin, UA: NEGATIVE
GLUCOSE UA: NEGATIVE
Ketones, UA: NEGATIVE
NITRITE UA: NEGATIVE
Spec Grav, UA: 1.02
UROBILINOGEN UA: NEGATIVE
pH, UA: 7.5

## 2016-11-27 MED ORDER — BUTALBITAL-APAP-CAFFEINE 50-325-40 MG PO CAPS: 1.0000 | ORAL_CAPSULE | Freq: Four times a day (QID) | ORAL | 3 refills | Status: DC | PRN

## 2016-11-27 MED ORDER — TETANUS-DIPHTH-ACELL PERTUSSIS 5-2.5-18.5 LF-MCG/0.5 IM SUSP
0.5000 mL | Freq: Once | INTRAMUSCULAR | Status: AC
  Administered 2016-11-27: 0.5 mL via INTRAMUSCULAR

## 2016-11-27 NOTE — Progress Notes (Signed)
ROB: C/o pelvic pressure. Denies contractions, vaginal bleeding.  Still noting one-sided headaches, not relieved with OTC meds. Prescribed Fioricet. For 28 week labs today.  Desires to breast and bottle feed, desires ParaGard IUD for contraception. Given handout. For Tdap today, signed blood consent, discussed cord blood banking.

## 2016-11-28 ENCOUNTER — Telehealth: Payer: Self-pay

## 2016-11-28 ENCOUNTER — Ambulatory Visit (INDEPENDENT_AMBULATORY_CARE_PROVIDER_SITE_OTHER): Payer: Medicaid Other | Admitting: Obstetrics and Gynecology

## 2016-11-28 ENCOUNTER — Encounter: Payer: Self-pay | Admitting: Obstetrics and Gynecology

## 2016-11-28 VITALS — BP 130/69 | HR 116 | Temp 97.5°F | Wt 179.2 lb

## 2016-11-28 DIAGNOSIS — R7309 Other abnormal glucose: Secondary | ICD-10-CM

## 2016-11-28 DIAGNOSIS — H9202 Otalgia, left ear: Secondary | ICD-10-CM

## 2016-11-28 DIAGNOSIS — J00 Acute nasopharyngitis [common cold]: Secondary | ICD-10-CM

## 2016-11-28 LAB — CBC
HEMATOCRIT: 32.6 % — AB (ref 34.0–46.6)
HEMOGLOBIN: 10.4 g/dL — AB (ref 11.1–15.9)
MCH: 25.1 pg — AB (ref 26.6–33.0)
MCHC: 31.9 g/dL (ref 31.5–35.7)
MCV: 79 fL (ref 79–97)
Platelets: 281 10*3/uL (ref 150–379)
RBC: 4.15 x10E6/uL (ref 3.77–5.28)
RDW: 14.2 % (ref 12.3–15.4)
WBC: 8.4 10*3/uL (ref 3.4–10.8)

## 2016-11-28 LAB — GLUCOSE, 1 HOUR GESTATIONAL: Gestational Diabetes Screen: 151 mg/dL — ABNORMAL HIGH (ref 65–139)

## 2016-11-28 NOTE — Telephone Encounter (Signed)
Called pt informed her of the need for 3hr gtt. Pt also c/o ear infection s/s requests appointment. Will work pt in today at 2:30pm.

## 2016-11-28 NOTE — Progress Notes (Signed)
Pt is a 29w 6d who presents with c/o headache left side of head with left side earache.

## 2016-11-28 NOTE — Telephone Encounter (Signed)
-----   Message from Hildred LaserAnika Cherry, MD sent at 11/28/2016  9:46 AM EST ----- Please inform patient of abnormal 1 hr glucola, needs to do 3 hr testing.  Also her blood count is still low.  She should take a daily iron supplement.

## 2016-11-28 NOTE — Progress Notes (Signed)
HPI:      [name] is a 21 y.o. [id number] who LMP was Patient's last menstrual period was 05/24/2016 (approximate).  Subjective: She presents today with complaint of left ear pain for 3 days. She also states that she has had a cold and a left-sided headache. Reports active fetal movement daily.    Hx: The following portions of the patient's history were reviewed and updated as appropriate:           She  has a past medical history of Migraine and Peritonsillar abscess. She  does not have any pertinent problems on file.        ROS: Constitutional: Denied constitutional symptoms, night sweats, recent illness, fatigue, fever, insomnia and weight loss.  Eyes: Denied eye symptoms, eye pain, photophobia, vision change and visual disturbance.  Ears/Nose/Throat/Neck: See HPI for additional information.  Cardiovascular: Denied cardiovascular symptoms, arrhythmia, chest pain/pressure, edema, exercise intolerance, orthopnea and palpitations.  Respiratory: Denied pulmonary symptoms, asthma, pleuritic pain, productive sputum, cough, dyspnea and wheezing.  Gastrointestinal: Denied, gastro-esophageal reflux, melena, nausea and vomiting.  Genitourinary: Denied genitourinary symptoms including symptomatic vaginal discharge, pelvic relaxation issues, and urinary complaints.  Musculoskeletal: Denied musculoskeletal symptoms, stiffness, swelling, muscle weakness and myalgia.  Dermatologic: Denied dermatology symptoms, rash and scar.  Neurologic: Denied neurology symptoms, dizziness, headache, neck pain and syncope.  Psychiatric: Denied psychiatric symptoms, anxiety and depression.  Endocrine: Denied endocrine symptoms including hot flashes and night sweats.   Meds: She has a current medication list which includes the following prescription(s): acetaminophen, aspirin ec, clobetasol ointment, and multivitamin-prenatal.  Objective: Vitals:   11/28/16 1452  BP: 130/69  Pulse: (!) 116  Temp:  97.5 F (36.4 C)            Examination of both tympanic membranes reveal some retraction of each. Left greater than right. There is no erythema.  Assessment: 1. Ear pain, left   2. Acute nasopharyngitis     Likely viral URI. Tympanic membrane retractions consistent with blocked eustachian tube without infection. Headache likely secondary to URI and ear pain. Plan:             1.  We have discussed this diagnosis in detail. Symptomatic over-the-counter medications discussed. Use of antihistamines and increased fluids discussed. If symptoms do not resolve by next week patient to contact us for further evaluation.  Orders No orders of the defined types were placed in this encounter.   No orders of the defined types were placed in this encounter.       F/U  Return for Next Scheduled Follow-up.  Elonda Huskyavid J. Bradan Congrove, M.D. 11/28/2016 3:38 PM

## 2016-11-29 ENCOUNTER — Emergency Department
Admission: EM | Admit: 2016-11-29 | Discharge: 2016-11-29 | Disposition: A | Discharge status: Home / Self Care | Payer: Medicaid Other | Attending: Emergency Medicine | Admitting: Emergency Medicine

## 2016-11-29 ENCOUNTER — Encounter: Payer: Self-pay | Admitting: Emergency Medicine

## 2016-11-29 DIAGNOSIS — H6502 Acute serous otitis media, left ear: Secondary | ICD-10-CM | POA: Diagnosis not present

## 2016-11-29 DIAGNOSIS — Z3A31 31 weeks gestation of pregnancy: Secondary | ICD-10-CM | POA: Insufficient documentation

## 2016-11-29 DIAGNOSIS — H9202 Otalgia, left ear: Secondary | ICD-10-CM

## 2016-11-29 DIAGNOSIS — R0981 Nasal congestion: Secondary | ICD-10-CM

## 2016-11-29 DIAGNOSIS — O9989 Other specified diseases and conditions complicating pregnancy, childbirth and the puerperium: Secondary | ICD-10-CM | POA: Diagnosis present

## 2016-11-29 MED ORDER — FLUTICASONE PROPIONATE 50 MCG/ACT NA SUSP: 1.0000 | Freq: Every day | NASAL | 0 refills | Status: DC

## 2016-11-29 MED ORDER — FLUTICASONE PROPIONATE 50 MCG/ACT NA SUSP
1.0000 | Freq: Every day | NASAL | Status: DC
  Administered 2016-11-29: 1 via NASAL
  Filled 2016-11-29: qty 16

## 2016-11-29 NOTE — ED Notes (Signed)
Pt verbalizes understanding of discharge instructions.

## 2016-11-29 NOTE — ED Provider Notes (Signed)
[facility]-EMERGENCY DEPARTMENT Provider Note   CSN: [id number] Arrival date & time: 11/29/16  1905     History   Chief Complaint Chief Complaint  Patient presents with  . Otalgia    HPI Mandy Romero is a 21 y.o. female presents to the emergency department for evaluation of left ear pain. Patient has been sick for 6 days. She's had nasal congestion, cough. Left ear pain began one to 2 days ago. Pain is moderate. She's taken Tylenol without much relief. She denies any drainage. She describes the pain as pressure. She denies any loss of hearing. No headache, fevers. Patient is pregnant, 30 weeks 0 days. She feels normal fetal movement with no vaginal discharge or bleeding.  HPI  Past Medical History:  Diagnosis Date  . Migraine   . Peritonsillar abscess     Patient Active Problem List   Diagnosis Date Noted  . Headache in pregnancy 11/27/2016  . Nausea and vomiting during pregnancy 09/28/2016  . H/O preterm delivery, currently pregnant, second trimester 09/01/2016  . Late prenatal care affecting pregnancy in second trimester 09/01/2016  . Obesity (BMI 30-39.9) 08/30/2016  . Maternal varicella, non-immune 08/30/2016    Past Surgical History:  Procedure Laterality Date  . CHOLECYSTECTOMY      OB History    Gravida Para Term Preterm AB Living   3 2   2   2    SAB TAB Ectopic Multiple Live Births           2       Home Medications    Prior to Admission medications   Medication Sig Start Date End Date Taking? Authorizing Provider  acetaminophen (TYLENOL) 325 MG tablet Take 650 mg by mouth every 6 (six) hours as needed.    Historical Provider, MD  aspirin EC 81 MG tablet Take 1 tablet (81 mg total) by mouth daily. Take after 12 weeks for prevention of preeclampssia later in pregnancy 09/25/16   Hildred Laser, MD  clobetasol ointment (TEMOVATE) 0.05 % Apply 1 application topically 2 (two) times daily. Apply to affected area 11/13/16   Melody N Shambley, CNM  fluticasone  (FLONASE) 50 MCG/ACT nasal spray Place 1-2 sprays into both nostrils daily. X 2-3 days 11/29/16 11/29/17  Evon Slack, PA-C  Prenatal Vit-Fe Fumarate-FA (MULTIVITAMIN-PRENATAL) 27-0.8 MG TABS tablet Take 1 tablet by mouth daily at 12 noon.    Historical Provider, MD    Family History No family history on file.  Social History Social History  Substance Use Topics  . Smoking status: Never Smoker  . Smokeless tobacco: Never Used  . Alcohol use No     Allergies   Patient has no known allergies.   Review of Systems Review of Systems  Constitutional: Negative for activity change, chills, fatigue and fever.  HENT: Positive for congestion and ear pain. Negative for ear discharge, sinus pressure and sore throat.   Eyes: Negative for visual disturbance.  Respiratory: Negative for cough, chest tightness and shortness of breath.   Cardiovascular: Negative for chest pain and leg swelling.  Gastrointestinal: Negative for abdominal pain, diarrhea, nausea and vomiting.  Genitourinary: Negative for dysuria, frequency, hematuria, vaginal bleeding and vaginal discharge.  Musculoskeletal: Negative for arthralgias and gait problem.  Skin: Negative for rash.  Neurological: Negative for weakness, numbness and headaches.  Hematological: Negative for adenopathy.  Psychiatric/Behavioral: Negative for agitation, behavioral problems and confusion.     Physical Exam Updated Vital Signs BP (!) 98/53 (BP Location: Left Arm)   Pulse  98   Temp 98.2 F (36.8 C) (Oral)   Resp 20   Ht 5' (1.524 m)   Wt 81.6 kg   LMP 05/24/2016 (Approximate)   SpO2 98%   BMI 35.15 kg/m   Physical Exam  Constitutional: She appears well-developed and well-nourished. No distress.  HENT:  Head: Normocephalic and atraumatic.  Right Ear: Hearing, tympanic membrane, external ear and ear canal normal.  Left Ear: Hearing, external ear and ear canal normal. No drainage. Tympanic membrane is bulging. Tympanic membrane is  not injected, not scarred, not perforated, not erythematous and not retracted. A middle ear effusion is present.  Eyes: Conjunctivae are normal.  Neck: Normal range of motion. Neck supple.  Cardiovascular: Normal rate and regular rhythm.   No murmur heard. Pulmonary/Chest: Effort normal and breath sounds normal. No respiratory distress.  Abdominal: Soft. There is no tenderness. There is no guarding.  Musculoskeletal: She exhibits no edema.  Lymphadenopathy:    She has no cervical adenopathy.  Neurological: She is alert.  Skin: Skin is warm and dry.  Psychiatric: She has a normal mood and affect.  Nursing note and vitals reviewed.    ED Treatments / Results  Labs (all labs ordered are listed, but only abnormal results are displayed) Labs Reviewed - No data to display  EKG  EKG Interpretation None       Radiology No results found.  Procedures Procedures (including critical care time)  Medications Ordered in ED Medications - No data to display   Initial Impression / Assessment and Plan / ED Course  I have reviewed the triage vital signs and the nursing notes.  Pertinent labs & imaging results that were available during my care of the patient were reviewed by me and considered in my medical decision making (see chart for details).     21 year old female with serous otitis media of left ear, nasal congestion. She is afebrile. Vital signs are normal. Did a pregnancy we'll treat with Tylenol and 2-3 days of Flonase. She'll follow-up with PCP or walk-in clinic if no improvement to 3 days. She is educated on signs and symptoms return to the emergency pertinent for. Currently denying any decrease in fetal movement, abdominal or pelvic pain, vaginal bleeding or discharge.  Final Clinical Impressions(s) / ED Diagnoses   Final diagnoses:  Nasal congestion  Left ear pain  Acute serous otitis media of left ear, recurrence not specified    New Prescriptions New Prescriptions     FLUTICASONE (FLONASE) 50 MCG/ACT NASAL SPRAY    Place 1-2 sprays into both nostrils daily. X 2-3 days     Evon Slackhomas C Gaines, PA-C 11/29/16 2002    Emily FilbertJonathan E Williams, MD 11/29/16 2031

## 2016-11-29 NOTE — Discharge Instructions (Signed)
Please continue Tylenol as needed for pain. Use Flonase nasal spray 1-2 sprays each nostril once a day as needed for 2-3 days. Please make sure your drinking lots of fluids. Follow-up with PCP or walk in clinic in 2-3 days if no improvement.

## 2016-11-29 NOTE — ED Notes (Signed)
Pt reports been congested since Monday reports left ear discomfort  her son is sick pt denies any chest pain, denies any cough denies any other symptom pt talks in complete sentences.

## 2016-11-29 NOTE — ED Triage Notes (Signed)
Pt is ambulatory to triage with c/o left ear pain. Pt reports that this started Monday and Wednesday she felt a pop. Pt is [redacted] weeks pregnant and asked her OB about her ear on Wednesday and was told that her ear drum looked like it was retracted. Pt is in NAD at this time.

## 2016-12-11 ENCOUNTER — Ambulatory Visit (INDEPENDENT_AMBULATORY_CARE_PROVIDER_SITE_OTHER): Payer: Medicaid Other | Admitting: Obstetrics and Gynecology

## 2016-12-11 ENCOUNTER — Encounter: Payer: Self-pay | Admitting: Obstetrics and Gynecology

## 2016-12-11 VITALS — BP 92/58 | HR 97 | Wt 178.6 lb

## 2016-12-11 DIAGNOSIS — O0993 Supervision of high risk pregnancy, unspecified, third trimester: Secondary | ICD-10-CM

## 2016-12-11 NOTE — Progress Notes (Signed)
Ear pain and URI resolved. Patient with no complaints today.

## 2016-12-21 ENCOUNTER — Inpatient Hospital Stay
Admission: EM | Admit: 2016-12-21 | Discharge: 2016-12-21 | Disposition: A | Discharge status: Home / Self Care | Payer: Medicaid Other | Attending: Obstetrics and Gynecology | Admitting: Obstetrics and Gynecology

## 2016-12-21 DIAGNOSIS — Z3A33 33 weeks gestation of pregnancy: Secondary | ICD-10-CM | POA: Insufficient documentation

## 2016-12-21 DIAGNOSIS — O4703 False labor before 37 completed weeks of gestation, third trimester: Secondary | ICD-10-CM

## 2016-12-21 DIAGNOSIS — O26893 Other specified pregnancy related conditions, third trimester: Secondary | ICD-10-CM | POA: Diagnosis present

## 2016-12-21 DIAGNOSIS — R102 Pelvic and perineal pain: Secondary | ICD-10-CM | POA: Diagnosis present

## 2016-12-21 LAB — URINALYSIS, ROUTINE W REFLEX MICROSCOPIC
BACTERIA UA: NONE SEEN
Bilirubin Urine: NEGATIVE
Glucose, UA: NEGATIVE mg/dL
Hgb urine dipstick: NEGATIVE
Ketones, ur: NEGATIVE mg/dL
Nitrite: NEGATIVE
Protein, ur: NEGATIVE mg/dL
SPECIFIC GRAVITY, URINE: 1.015 (ref 1.005–1.030)
pH: 7 (ref 5.0–8.0)

## 2016-12-21 MED ORDER — ACETAMINOPHEN 325 MG PO TABS
650.0000 mg | ORAL_TABLET | Freq: Once | ORAL | Status: AC | PRN
  Administered 2016-12-21: 650 mg via ORAL
  Filled 2016-12-21: qty 2

## 2016-12-21 NOTE — Final Progress Note (Signed)
L&D OB Triage Note  Mandy Romero is a 21 y.o. [id number] female at 7274w1d, EDD Estimated Date of Delivery: 02/07/17 who presented to triage for complaints of lwer abdominal pressure and ctx for 5-6 min.  Complained of burning with urination ~ 2 days ago.  Noted 1 episode of vomiting this morning. She was evaluated by the nurses with no significant findings for preterm labor or UTI. Vital signs stable. An NST was performed and has been reviewed by MD. She was treated with Tylenol and PO hydration with resolution of pain.   NST INTERPRETATION: Indications: rule out uterine contractions  Mode: External Baseline Rate (A): 135 bpm Variability: Moderate Accelerations: 15 x 15 Decelerations: None     Contraction Frequency (min): ripples; irregular, infrequent  Impression: reactive   Labs:   Results for orders placed or performed during the hospital encounter of 12/21/16  Urinalysis, Routine w reflex microscopic  Result Value Ref Range   Color, Urine YELLOW (A) YELLOW   APPearance CLOUDY (A) CLEAR   Specific Gravity, Urine 1.015 1.005 - 1.030   pH 7.0 5.0 - 8.0   Glucose, UA NEGATIVE NEGATIVE mg/dL   Hgb urine dipstick NEGATIVE NEGATIVE   Bilirubin Urine NEGATIVE NEGATIVE   Ketones, ur NEGATIVE NEGATIVE mg/dL   Protein, ur NEGATIVE NEGATIVE mg/dL   Nitrite NEGATIVE NEGATIVE   Leukocytes, UA LARGE (A) NEGATIVE   RBC / HPF 0-5 0 - 5 RBC/hpf   WBC, UA 6-30 0 - 5 WBC/hpf   Bacteria, UA NONE SEEN NONE SEEN   Squamous Epithelial / LPF 6-30 (A) NONE SEEN   Mucous PRESENT     Plan: NST performed was reviewed and was found to be reactive. She was discharged home with bleeding/preterm labor precautions.  Continue routine prenatal care. Follow up with OB/GYN as previously scheduled.     Hildred LaserAnika Kalika Smay, MD  [facility]

## 2016-12-21 NOTE — OB Triage Note (Signed)
Pt presents with c/o of lower abdominal pressure and ctx q 5-6 minutes.  The ctx started last night around 1130 pm and the abdominal pressure started this morning.  She reports burning with urination approx 2 days ago.  Reports vomiting x1 this morning, but denies nausea.

## 2016-12-25 ENCOUNTER — Encounter: Payer: Self-pay | Admitting: Obstetrics and Gynecology

## 2016-12-25 ENCOUNTER — Ambulatory Visit (INDEPENDENT_AMBULATORY_CARE_PROVIDER_SITE_OTHER): Payer: Medicaid Other | Admitting: Obstetrics and Gynecology

## 2016-12-25 VITALS — BP 133/65 | HR 92 | Wt 178.9 lb

## 2016-12-25 DIAGNOSIS — O09893 Supervision of other high risk pregnancies, third trimester: Secondary | ICD-10-CM

## 2016-12-25 DIAGNOSIS — Z3483 Encounter for supervision of other normal pregnancy, third trimester: Secondary | ICD-10-CM

## 2016-12-25 DIAGNOSIS — O99810 Abnormal glucose complicating pregnancy: Secondary | ICD-10-CM

## 2016-12-25 DIAGNOSIS — O26843 Uterine size-date discrepancy, third trimester: Secondary | ICD-10-CM

## 2016-12-25 DIAGNOSIS — E669 Obesity, unspecified: Secondary | ICD-10-CM

## 2016-12-25 DIAGNOSIS — O09213 Supervision of pregnancy with history of pre-term labor, third trimester: Secondary | ICD-10-CM

## 2016-12-25 LAB — POCT URINALYSIS DIPSTICK
Bilirubin, UA: NEGATIVE
GLUCOSE UA: NEGATIVE
Ketones, UA: NEGATIVE
NITRITE UA: NEGATIVE
Spec Grav, UA: 1.02
UROBILINOGEN UA: NEGATIVE
pH, UA: 6.5

## 2016-12-25 NOTE — Progress Notes (Signed)
ROB: Patient notes increased pelvic pressure.  Notes occasional ctx.  Was seen in triage several days ago for contractions, was ruled out for PTL.  Given PTL precautions as patient with h/o preterm births.  BP wnl (but elevated for patient), and has +1 protein today.  Discussed risk of developing PIH. Will monitor at subsequent visits.  Patient also with S>D, will order growth scan.  Still has not completed 3 hr GTT, will need to complete within next several days.

## 2016-12-30 ENCOUNTER — Other Ambulatory Visit: Payer: Medicaid Other

## 2016-12-30 ENCOUNTER — Ambulatory Visit (INDEPENDENT_AMBULATORY_CARE_PROVIDER_SITE_OTHER): Payer: Medicaid Other

## 2016-12-30 DIAGNOSIS — Z3483 Encounter for supervision of other normal pregnancy, third trimester: Secondary | ICD-10-CM | POA: Diagnosis not present

## 2017-01-07 ENCOUNTER — Ambulatory Visit (INDEPENDENT_AMBULATORY_CARE_PROVIDER_SITE_OTHER): Payer: Medicaid Other | Admitting: Obstetrics and Gynecology

## 2017-01-07 VITALS — BP 105/69 | HR 121 | Wt 182.0 lb

## 2017-01-07 DIAGNOSIS — O09213 Supervision of pregnancy with history of pre-term labor, third trimester: Secondary | ICD-10-CM

## 2017-01-07 DIAGNOSIS — Z3483 Encounter for supervision of other normal pregnancy, third trimester: Secondary | ICD-10-CM

## 2017-01-07 DIAGNOSIS — O09893 Supervision of other high risk pregnancies, third trimester: Secondary | ICD-10-CM

## 2017-01-07 DIAGNOSIS — Z3685 Encounter for antenatal screening for Streptococcus B: Secondary | ICD-10-CM

## 2017-01-07 DIAGNOSIS — G43909 Migraine, unspecified, not intractable, without status migrainosus: Secondary | ICD-10-CM

## 2017-01-07 DIAGNOSIS — Z113 Encounter for screening for infections with a predominantly sexual mode of transmission: Secondary | ICD-10-CM

## 2017-01-07 LAB — POCT URINALYSIS DIPSTICK
BILIRUBIN UA: NEGATIVE
GLUCOSE UA: NEGATIVE
KETONES UA: NEGATIVE
Nitrite, UA: NEGATIVE
SPEC GRAV UA: 1.015 (ref 1.030–1.035)
UROBILINOGEN UA: NEGATIVE (ref ?–2.0)
pH, UA: 7 (ref 5.0–8.0)

## 2017-01-07 LAB — OB RESULTS CONSOLE GBS: GBS: NEGATIVE

## 2017-01-07 MED ORDER — BUTALBITAL-APAP-CAFFEINE 50-325-40 MG PO CAPS: 1.0000 | ORAL_CAPSULE | Freq: Four times a day (QID) | ORAL | 1 refills | Status: DC | PRN

## 2017-01-07 MED ORDER — LORATADINE 10 MG PO TABS: 10.0000 mg | ORAL_TABLET | Freq: Every day | ORAL | Status: DC

## 2017-01-07 NOTE — Progress Notes (Signed)
ROB: Patient c/o increased pressure in pelvis.  Also noting persistent headache behind left eye, with pressure, dizziness, and tearing. Not relieved by Tylenol ES or Excedrin.  Likely migraine as patient has had history.  Prescribed Fiorcet. Also encouraged use of allergy meds (Claritin). 36 week labs done today. Labor precautions given. Can d/c baby aspirin. RTC in 1 week.

## 2017-01-08 DIAGNOSIS — G43909 Migraine, unspecified, not intractable, without status migrainosus: Secondary | ICD-10-CM | POA: Insufficient documentation

## 2017-01-09 ENCOUNTER — Encounter: Payer: Medicaid Other | Admitting: Obstetrics and Gynecology

## 2017-01-09 ENCOUNTER — Other Ambulatory Visit: Payer: Medicaid Other

## 2017-01-10 LAB — GC/CHLAMYDIA PROBE AMP
Chlamydia trachomatis, NAA: NEGATIVE
NEISSERIA GONORRHOEAE BY PCR: NEGATIVE

## 2017-01-11 ENCOUNTER — Observation Stay
Admission: EM | Admit: 2017-01-11 | Discharge: 2017-01-12 | Disposition: A | Discharge status: Home / Self Care | Payer: Medicaid Other | Attending: Obstetrics and Gynecology | Admitting: Obstetrics and Gynecology

## 2017-01-11 DIAGNOSIS — O26893 Other specified pregnancy related conditions, third trimester: Secondary | ICD-10-CM | POA: Diagnosis not present

## 2017-01-11 DIAGNOSIS — O4703 False labor before 37 completed weeks of gestation, third trimester: Principal | ICD-10-CM | POA: Insufficient documentation

## 2017-01-11 DIAGNOSIS — W000XXA Fall on same level due to ice and snow, initial encounter: Secondary | ICD-10-CM | POA: Insufficient documentation

## 2017-01-11 DIAGNOSIS — Z3A36 36 weeks gestation of pregnancy: Secondary | ICD-10-CM | POA: Diagnosis not present

## 2017-01-11 MED ORDER — ACETAMINOPHEN 325 MG PO TABS: 650.0000 mg | ORAL_TABLET | ORAL | Status: DC | PRN

## 2017-01-11 MED ORDER — OXYCODONE-ACETAMINOPHEN 5-325 MG PO TABS
1.0000 | ORAL_TABLET | Freq: Once | ORAL | Status: AC
  Administered 2017-01-11: 1 via ORAL
  Filled 2017-01-11: qty 1

## 2017-01-11 MED ORDER — PRENATAL MULTIVITAMIN CH: 1.0000 | ORAL_TABLET | Freq: Every day | ORAL | Status: DC

## 2017-01-11 NOTE — OB Triage Note (Signed)
Patient states that she was carrying her son while climbing the stairs. It was snowing and raining outside. She slipped and fell on 01/10/17 around 1800. She fell on her lower left pelvis.  She states shes had some spotting and her contractions started around 0300 on 3/25. They started to become more painful- so patient wanted to be seen in Labor and delivery. States she isnt sure whether theyre labor contractions or contractions from her falling

## 2017-01-12 DIAGNOSIS — Z3A36 36 weeks gestation of pregnancy: Secondary | ICD-10-CM

## 2017-01-12 DIAGNOSIS — O26893 Other specified pregnancy related conditions, third trimester: Secondary | ICD-10-CM | POA: Diagnosis not present

## 2017-01-12 DIAGNOSIS — R109 Unspecified abdominal pain: Secondary | ICD-10-CM | POA: Diagnosis not present

## 2017-01-12 DIAGNOSIS — O4703 False labor before 37 completed weeks of gestation, third trimester: Secondary | ICD-10-CM | POA: Diagnosis not present

## 2017-01-12 LAB — CULTURE, BETA STREP (GROUP B ONLY): Strep Gp B Culture: NEGATIVE

## 2017-01-12 NOTE — OB Triage Note (Signed)
Discharge instructions reviewed with patient. Patient verbally understands braxton hicks, fetal kick counts, leaking of fluid, bleeding.  Patient denies pain or feeling contractions. States "They feel better. But what are ways I can change the dilation of my cervix?" The importance of Full term discussed with patient.    . Understands to keep scheduled appointments with provider and call with and concerns or needs that change in status.

## 2017-01-15 ENCOUNTER — Ambulatory Visit (INDEPENDENT_AMBULATORY_CARE_PROVIDER_SITE_OTHER): Payer: Medicaid Other | Admitting: Obstetrics and Gynecology

## 2017-01-15 ENCOUNTER — Other Ambulatory Visit: Payer: Medicaid Other

## 2017-01-15 ENCOUNTER — Other Ambulatory Visit: Payer: Self-pay | Admitting: Obstetrics and Gynecology

## 2017-01-15 VITALS — BP 113/70 | HR 84 | Wt 183.4 lb

## 2017-01-15 DIAGNOSIS — Z3483 Encounter for supervision of other normal pregnancy, third trimester: Secondary | ICD-10-CM

## 2017-01-15 DIAGNOSIS — Z131 Encounter for screening for diabetes mellitus: Secondary | ICD-10-CM

## 2017-01-15 DIAGNOSIS — G44059 Short lasting unilateral neuralgiform headache with conjunctival injection and tearing (SUNCT), not intractable: Secondary | ICD-10-CM

## 2017-01-15 DIAGNOSIS — R7309 Other abnormal glucose: Secondary | ICD-10-CM

## 2017-01-15 DIAGNOSIS — O99210 Obesity complicating pregnancy, unspecified trimester: Secondary | ICD-10-CM

## 2017-01-15 LAB — POCT URINALYSIS DIPSTICK
BILIRUBIN UA: NEGATIVE
GLUCOSE UA: NEGATIVE
KETONES UA: NEGATIVE
Nitrite, UA: NEGATIVE
PROTEIN UA: NEGATIVE
Spec Grav, UA: 1.005 (ref 1.030–1.035)
Urobilinogen, UA: NEGATIVE (ref ?–2.0)
pH, UA: 7.5 (ref 5.0–8.0)

## 2017-01-15 NOTE — Final Progress Note (Signed)
L&D OB Triage Note  Mandy Romero is a 21 y.o. [id number] female at 985w1d, EDD Estimated Date of Delivery: 02/07/17 who presented to triage for complaints of ground level fall.  Patient reports that she slipped and fell 1 day ago due to bad weather (snow and rain) while attempting to climb stairs.  She fell on her lower pelvis (left side).  Also c/o spotting and contractions beginning today around 3:00 a.m.   She was evaluated by the nurses with no significant findings for preterm labor. Vital signs stable. An NST was performed and has been reviewed by MD.   NST INTERPRETATION: Indications: rule out uterine contractions  Mode: External Baseline Rate (A): 145 bpm Variability: Moderate Accelerations: 15 x 15 Decelerations: None     Contraction Frequency (min): irregular irritabilithy.   Impression: reactive   Plan: NST performed was reviewed and was found to be reactive. She was discharged home with bleeding/ preterm labor precautions.  Continue routine prenatal care. Follow up with OB/GYN as previously scheduled.     Hildred LaserAnika Maciej Schweitzer, MD [facility]

## 2017-01-15 NOTE — Progress Notes (Signed)
ROB: Patient still noting headache behind left eye.  Has tried fiorcet with no relief, is using Claritin.  Was seen in triage ` 2 weeks ago, tried Percocet with no relief of headache.  Patient may need referral to Neurology if it does not resolve (however will likely deliver prior to being seen).  36 week labs negative. Completing 3 hr glucola today.

## 2017-01-16 LAB — GESTATIONAL GLUCOSE TOLERANCE
GLUCOSE 2 HOUR GTT: 112 mg/dL (ref 65–154)
Glucose, Fasting: 73 mg/dL (ref 65–94)
Glucose, GTT - 1 Hour: 167 mg/dL (ref 65–179)
Glucose, GTT - 3 Hour: 119 mg/dL (ref 65–139)

## 2017-01-20 ENCOUNTER — Telehealth: Payer: Self-pay

## 2017-01-20 NOTE — Telephone Encounter (Signed)
Called pt informed her of negative 3hr GTT, pt gave verbal understanding. Pt also notes she might be losing mucus plug, advised pt that losing mucus plug does not indicate immediate labor, advised to to watch for signs and sx of labor.

## 2017-01-20 NOTE — Telephone Encounter (Signed)
-----   Message from Hildred Laser, MD sent at 01/20/2017  1:54 PM EDT ----- Please inform of normal 3 hr glucola.

## 2017-01-22 ENCOUNTER — Encounter: Payer: Medicaid Other | Admitting: Obstetrics and Gynecology

## 2017-01-23 ENCOUNTER — Observation Stay
Admission: EM | Admit: 2017-01-23 | Discharge: 2017-01-23 | Disposition: A | Discharge status: Home / Self Care | Payer: Medicaid Other | Attending: Obstetrics and Gynecology | Admitting: Obstetrics and Gynecology

## 2017-01-23 ENCOUNTER — Ambulatory Visit (INDEPENDENT_AMBULATORY_CARE_PROVIDER_SITE_OTHER): Payer: Medicaid Other | Admitting: Obstetrics and Gynecology

## 2017-01-23 VITALS — BP 90/55 | HR 93 | Wt 185.6 lb

## 2017-01-23 DIAGNOSIS — O479 False labor, unspecified: Secondary | ICD-10-CM | POA: Diagnosis not present

## 2017-01-23 DIAGNOSIS — B3731 Acute candidiasis of vulva and vagina: Secondary | ICD-10-CM

## 2017-01-23 DIAGNOSIS — B373 Candidiasis of vulva and vagina: Secondary | ICD-10-CM

## 2017-01-23 DIAGNOSIS — R300 Dysuria: Secondary | ICD-10-CM

## 2017-01-23 DIAGNOSIS — Z3A37 37 weeks gestation of pregnancy: Secondary | ICD-10-CM | POA: Diagnosis not present

## 2017-01-23 DIAGNOSIS — O4693 Antepartum hemorrhage, unspecified, third trimester: Secondary | ICD-10-CM

## 2017-01-23 DIAGNOSIS — O26893 Other specified pregnancy related conditions, third trimester: Secondary | ICD-10-CM

## 2017-01-23 DIAGNOSIS — Z3483 Encounter for supervision of other normal pregnancy, third trimester: Secondary | ICD-10-CM

## 2017-01-23 DIAGNOSIS — O471 False labor at or after 37 completed weeks of gestation: Secondary | ICD-10-CM | POA: Diagnosis not present

## 2017-01-23 DIAGNOSIS — Z3490 Encounter for supervision of normal pregnancy, unspecified, unspecified trimester: Secondary | ICD-10-CM

## 2017-01-23 LAB — POCT URINALYSIS DIPSTICK
Bilirubin, UA: NEGATIVE
GLUCOSE UA: NEGATIVE
Ketones, UA: NEGATIVE
NITRITE UA: NEGATIVE
Protein, UA: NEGATIVE
Spec Grav, UA: 1.015 (ref 1.030–1.035)
UROBILINOGEN UA: NEGATIVE (ref ?–2.0)
pH, UA: 6.5 (ref 5.0–8.0)

## 2017-01-23 MED ORDER — FLUCONAZOLE 150 MG PO TABS: 150.0000 mg | ORAL_TABLET | Freq: Once | ORAL | 3 refills | Status: AC

## 2017-01-23 NOTE — Discharge Summary (Signed)
Patient discharged with instructions on follow up appointment, labor precautions, and when to seek medical attention. Patient ambulatory at discharge with steady gait. Patient verbalized understanding of discharge instructions. Patient discharged with steady gait and no complaints.

## 2017-01-23 NOTE — OB Triage Note (Signed)
Patient came in for observation for labor evaluation. Patient reports contractions every five to six minutes. Patient rates pain 8 out of 10. Patient denies leaking of fluid, vaginal bleeding, and spotting. Vital signs stable and patient afebrile. FHR baseline 145 with moderate variability with accelerations 15 x 15 and no decelerations. Significant other at bedside. Will continue to monitor.

## 2017-01-23 NOTE — Progress Notes (Signed)
ROB: Patient c/o UTI symptoms.  Notes discomfort with urination and white discharge, thick, clumpy.   UA normal except 3+ leukocytes. Will send for culture due to symptoms. Advised on increased hydration, Azo for symptoms. Will treat for suspected yeast infxn with Diflucan. Labor precautions reiterated.  RTC in 1 week.

## 2017-01-24 LAB — URINE CULTURE

## 2017-01-26 NOTE — Final Progress Note (Addendum)
L&D OB Triage Note  Mandy Romero is a 21 y.o. [id number] female at [redacted]w[redacted]d, EDD Estimated Date of Delivery: 02/07/17 who presented to triage for complaints of contractions, vaginal bleeding. Contractions noted q 5-6 minutes, rated 8/10 on pain scale.  Vaginal bleeding described as light.  Of note, patient had an OB appointment earlier today where she had a cervical check.  She was evaluated by the nurses with no significant finding for labor. Vital signs stable. An NST was performed and has been reviewed by MD. She declined medications.  After 2 hours, contractions spaced out to greater than 10 minutes and pain decreased.   NST INTERPRETATION: Indications: rule out uterine contractions  Mode: External Baseline Rate (A): 180 bpm (FHT) Variability: Moderate Accelerations: 15 x 15 Decelerations: None     Contraction Frequency (min): occas  Impression: reactive   Plan: NST performed was reviewed and was found to be reactive. She was discharged home with bleeding/labor precautions.  Continue routine prenatal care. Follow up with OB/GYN as previously scheduled.     Hildred Laser, MD  [facility]

## 2017-01-27 ENCOUNTER — Encounter: Payer: Self-pay | Admitting: Obstetrics and Gynecology

## 2017-01-27 ENCOUNTER — Ambulatory Visit (INDEPENDENT_AMBULATORY_CARE_PROVIDER_SITE_OTHER): Payer: Medicaid Other | Admitting: Obstetrics and Gynecology

## 2017-01-27 VITALS — BP 111/70 | HR 91 | Wt 184.5 lb

## 2017-01-27 DIAGNOSIS — Z3493 Encounter for supervision of normal pregnancy, unspecified, third trimester: Secondary | ICD-10-CM

## 2017-01-27 LAB — POCT URINALYSIS DIPSTICK
Bilirubin, UA: NEGATIVE
Glucose, UA: NEGATIVE
Ketones, UA: NEGATIVE
LEUKOCYTES UA: NEGATIVE
Nitrite, UA: NEGATIVE
PROTEIN UA: NEGATIVE
Spec Grav, UA: 1.02 (ref 1.030–1.035)
UROBILINOGEN UA: NEGATIVE (ref ?–2.0)
pH, UA: 5 (ref 5.0–8.0)

## 2017-01-27 NOTE — Progress Notes (Signed)
ROB:  Denies ctx.  Scant irreg vaginal bleeding.  Fetal mvt and kick counts discussed. S&S of labor reviewed.  Urine cx - NEG.

## 2017-01-28 ENCOUNTER — Inpatient Hospital Stay
Admission: EM | Admit: 2017-01-28 | Discharge: 2017-01-30 | DRG: 767 | Disposition: A | Discharge status: Home / Self Care | Payer: Medicaid Other | Attending: Obstetrics and Gynecology | Admitting: Obstetrics and Gynecology

## 2017-01-28 DIAGNOSIS — E669 Obesity, unspecified: Secondary | ICD-10-CM | POA: Diagnosis present

## 2017-01-28 DIAGNOSIS — Z6830 Body mass index (BMI) 30.0-30.9, adult: Secondary | ICD-10-CM | POA: Diagnosis not present

## 2017-01-28 DIAGNOSIS — O99214 Obesity complicating childbirth: Secondary | ICD-10-CM | POA: Diagnosis present

## 2017-01-28 DIAGNOSIS — Z3493 Encounter for supervision of normal pregnancy, unspecified, third trimester: Secondary | ICD-10-CM | POA: Diagnosis present

## 2017-01-28 DIAGNOSIS — Z7982 Long term (current) use of aspirin: Secondary | ICD-10-CM

## 2017-01-28 DIAGNOSIS — Z3A38 38 weeks gestation of pregnancy: Secondary | ICD-10-CM

## 2017-01-28 LAB — CBC
HCT: 32.7 % — ABNORMAL LOW (ref 35.0–47.0)
HEMOGLOBIN: 10.4 g/dL — AB (ref 12.0–16.0)
MCH: 22.1 pg — AB (ref 26.0–34.0)
MCHC: 31.7 g/dL — ABNORMAL LOW (ref 32.0–36.0)
MCV: 69.8 fL — AB (ref 80.0–100.0)
Platelets: 296 10*3/uL (ref 150–440)
RBC: 4.68 MIL/uL (ref 3.80–5.20)
RDW: 17.2 % — ABNORMAL HIGH (ref 11.5–14.5)
WBC: 10.6 10*3/uL (ref 3.6–11.0)

## 2017-01-28 LAB — TYPE AND SCREEN
ABO/RH(D): O POS
Antibody Screen: NEGATIVE

## 2017-01-28 MED ORDER — MISOPROSTOL 200 MCG PO TABS
ORAL_TABLET | ORAL | Status: AC
  Filled 2017-01-28: qty 4

## 2017-01-28 MED ORDER — OXYTOCIN 40 UNITS IN LACTATED RINGERS INFUSION - SIMPLE MED
INTRAVENOUS | Status: AC
  Filled 2017-01-28: qty 1000

## 2017-01-28 MED ORDER — WITCH HAZEL-GLYCERIN EX PADS
1.0000 | MEDICATED_PAD | CUTANEOUS | Status: DC | PRN
Start: 2017-01-28 — End: 2017-01-30

## 2017-01-28 MED ORDER — AMMONIA AROMATIC IN INHA
RESPIRATORY_TRACT | Status: AC
  Filled 2017-01-28: qty 10

## 2017-01-28 MED ORDER — ONDANSETRON HCL 4 MG/2ML IJ SOLN: 4.0000 mg | INTRAMUSCULAR | Status: DC | PRN

## 2017-01-28 MED ORDER — IBUPROFEN 600 MG PO TABS
600.0000 mg | ORAL_TABLET | Freq: Four times a day (QID) | ORAL | Status: DC
  Administered 2017-01-28 – 2017-01-30 (×8): 600 mg via ORAL
  Filled 2017-01-28 (×9): qty 1

## 2017-01-28 MED ORDER — OXYTOCIN 40 UNITS IN LACTATED RINGERS INFUSION - SIMPLE MED: 2.5000 [IU]/h | INTRAVENOUS | Status: DC | PRN

## 2017-01-28 MED ORDER — DIBUCAINE 1 % RE OINT
1.0000 | TOPICAL_OINTMENT | RECTAL | Status: DC | PRN
Start: 2017-01-28 — End: 2017-01-30

## 2017-01-28 MED ORDER — BENZOCAINE-MENTHOL 20-0.5 % EX AERO
1.0000 | INHALATION_SPRAY | CUTANEOUS | Status: DC | PRN
Start: 2017-01-28 — End: 2017-01-30

## 2017-01-28 MED ORDER — CALCIUM CARBONATE ANTACID 500 MG PO CHEW: 2.0000 | CHEWABLE_TABLET | ORAL | Status: DC | PRN

## 2017-01-28 MED ORDER — ZOLPIDEM TARTRATE 5 MG PO TABS: 5.0000 mg | ORAL_TABLET | Freq: Every evening | ORAL | Status: DC | PRN

## 2017-01-28 MED ORDER — TETANUS-DIPHTH-ACELL PERTUSSIS 5-2.5-18.5 LF-MCG/0.5 IM SUSP: 0.5000 mL | Freq: Once | INTRAMUSCULAR | Status: DC

## 2017-01-28 MED ORDER — ACETAMINOPHEN 325 MG PO TABS: 650.0000 mg | ORAL_TABLET | ORAL | Status: DC | PRN

## 2017-01-28 MED ORDER — SIMETHICONE 80 MG PO CHEW: 80.0000 mg | CHEWABLE_TABLET | ORAL | Status: DC | PRN

## 2017-01-28 MED ORDER — DOCUSATE SODIUM 100 MG PO CAPS
100.0000 mg | ORAL_CAPSULE | Freq: Two times a day (BID) | ORAL | Status: DC
  Administered 2017-01-29 – 2017-01-30 (×2): 100 mg via ORAL
  Filled 2017-01-28 (×3): qty 1

## 2017-01-28 MED ORDER — ONDANSETRON HCL 4 MG PO TABS: 4.0000 mg | ORAL_TABLET | ORAL | Status: DC | PRN

## 2017-01-28 MED ORDER — OXYCODONE-ACETAMINOPHEN 5-325 MG PO TABS: 2.0000 | ORAL_TABLET | ORAL | Status: DC | PRN

## 2017-01-28 MED ORDER — PRENATAL MULTIVITAMIN CH
1.0000 | ORAL_TABLET | Freq: Every day | ORAL | Status: DC
  Administered 2017-01-28: 1 via ORAL
  Filled 2017-01-28: qty 1

## 2017-01-28 MED ORDER — OXYCODONE-ACETAMINOPHEN 5-325 MG PO TABS
1.0000 | ORAL_TABLET | ORAL | Status: DC | PRN
  Administered 2017-01-28 – 2017-01-29 (×4): 1 via ORAL
  Filled 2017-01-28 (×4): qty 1

## 2017-01-28 MED ORDER — COCONUT OIL OIL
1.0000 | TOPICAL_OIL | Status: DC | PRN
Start: 2017-01-28 — End: 2017-01-30

## 2017-01-28 MED ORDER — DIPHENHYDRAMINE HCL 25 MG PO CAPS: 25.0000 mg | ORAL_CAPSULE | Freq: Four times a day (QID) | ORAL | Status: DC | PRN

## 2017-01-28 MED ORDER — BUTORPHANOL TARTRATE 1 MG/ML IJ SOLN
INTRAMUSCULAR | Status: AC
  Administered 2017-01-28: 1 mg
  Filled 2017-01-28: qty 1

## 2017-01-28 MED ORDER — PRENATAL MULTIVITAMIN CH
1.0000 | ORAL_TABLET | Freq: Every day | ORAL | Status: DC
  Administered 2017-01-29 – 2017-01-30 (×2): 1 via ORAL
  Filled 2017-01-28 (×2): qty 1

## 2017-01-28 MED ORDER — LIDOCAINE HCL (PF) 1 % IJ SOLN
INTRAMUSCULAR | Status: AC
  Filled 2017-01-28: qty 30

## 2017-01-28 NOTE — H&P (Signed)
History and Physical   HPI  Mandy Romero is a 21 y.o. [phone] at [redacted]w[redacted]d Estimated Date of Delivery: 02/07/17 who is being admitted for  labor management   OB History  Obstetric History   G3   P2   T0   P2   A0   L2    SAB0   TAB0   Ectopic0   Multiple0   Live Births2     # Outcome Date GA Lbr Len/2nd Weight Sex Delivery Anes PTL Lv  3 Current           2 Preterm 2016 [redacted]w[redacted]d  6 lb 12 oz (3.062 kg) M Vag-Spont None  LIV  1 Preterm 2013 [redacted]w[redacted]d  6 lb 11 oz (3.033 kg) M Vag-Spont None  LIV      PROBLEM LIST  Pregnancy complications or risks: Patient Active Problem List   Diagnosis Date Noted  . Vaginal bleeding in pregnancy, third trimester 01/23/2017  . Pregnancy 01/23/2017  . Labor and delivery indication for care or intervention 01/12/2017  . Migraine 01/08/2017  . Abnormal glucose tolerance in pregnancy 12/25/2016  . Pelvic pain in pregnancy, antepartum, third trimester 12/21/2016  . Headache in pregnancy 11/27/2016  . H/O preterm delivery, currently pregnant, third trimester 09/01/2016  . Late prenatal care affecting pregnancy in second trimester 09/01/2016  . Obesity (BMI 30-39.9) 08/30/2016  . Maternal varicella, non-immune 08/30/2016    Prenatal labs and studies: ABO, Rh: --/--/O POS (09/10 1939) Antibody:   Rubella: 1.58 (11/09 1535) RPR: Non Reactive (11/09 1535)  HBsAg: Negative (11/09 1535)  HIV: Non Reactive (11/09 1535)  NGE:[id number] (03/21 0400)    Past Medical History:  Diagnosis Date  . Migraine   . Peritonsillar abscess   . Preterm labor      Past Surgical History:  Procedure Laterality Date  . CHOLECYSTECTOMY       Medications    Current Discharge Medication List    CONTINUE these medications which have NOT CHANGED   Details  Prenatal Vit-Fe Fumarate-FA (MULTIVITAMIN-PRENATAL) 27-0.8 MG TABS tablet Take 1 tablet by mouth daily at 12 noon.   Associated Diagnoses: Encounter for supervision of other normal pregnancy in second  trimester    acetaminophen (TYLENOL) 325 MG tablet Take 650 mg by mouth every 6 (six) hours as needed.   Associated Diagnoses: Encounter for supervision of other normal pregnancy in second trimester    aspirin EC 81 MG tablet Take 1 tablet (81 mg total) by mouth daily. Take after 12 weeks for prevention of preeclampssia later in pregnancy Qty: 300 tablet, Refills: 2    Butalbital-APAP-Caffeine 50-325-40 MG capsule Take 1-2 capsules by mouth every 6 (six) hours as needed for headache. Qty: 30 capsule, Refills: 1         Allergies  Patient has no known allergies.  Review of Systems  Pertinent items are noted in HPI.  Physical Exam  BP 105/72 (BP Location: Left Arm)   Pulse (!) 102   Temp 97.6 F (36.4 C) (Oral)   Resp 20   Ht 5' (1.524 m)   Wt 184 lb (83.5 kg)   LMP 05/24/2016 (Approximate)   BMI 35.94 kg/m   Lungs:  CTA B Cardio: RRR without M/R/G Abd: Soft, gravid, NT Presentation: cephalic EXT: No C/C/ 1+ Edema DTRs: 2+ B CERVIX: 5cm :75%: -2: mid position: soft  See Prenatal records for more detailed PE.     FHR:  Variability: Good {> 6 bpm) and Accelerations: Reactive  Toco:  Uterine Contractions: Q3 min   Test Results  Results for orders placed or performed in visit on 01/27/17 (from the past 24 hour(s))  POCT urinalysis dipstick     Status: None   Collection Time: 01/27/17  3:12 PM  Result Value Ref Range   Color, UA yellow    Clarity, UA clear    Glucose, UA neg    Bilirubin, UA neg    Ketones, UA neg    Spec Grav, UA 1.020 1.030 - 1.035   Blood, UA large    pH, UA 5.0 5.0 - 8.0   Protein, UA neg    Urobilinogen, UA negative Negative - 2.0   Nitrite, UA neg    Leukocytes, UA Negative Negative   Group B Strep negative  Assessment   [id number] at [redacted]w[redacted]d Estimated Date of Delivery: 02/07/17  The fetus is reassuring.   Patient Active Problem List   Diagnosis Date Noted  . Vaginal bleeding in pregnancy, third trimester 01/23/2017  .  Pregnancy 01/23/2017  . Labor and delivery indication for care or intervention 01/12/2017  . Migraine 01/08/2017  . Abnormal glucose tolerance in pregnancy 12/25/2016  . Pelvic pain in pregnancy, antepartum, third trimester 12/21/2016  . Headache in pregnancy 11/27/2016  . H/O preterm delivery, currently pregnant, third trimester 09/01/2016  . Late prenatal care affecting pregnancy in second trimester 09/01/2016  . Obesity (BMI 30-39.9) 08/30/2016  . Maternal varicella, non-immune 08/30/2016    Plan  1. Admit to L&D for anticipate vaginal delivery 2. EFM: -- Category 1 3. Epidural if desired. Stadol for IV pain until epidural requested. 4. Admission labs    Elonda Husky, M.D. 01/28/2017 8:31 AM

## 2017-01-28 NOTE — OB Triage Note (Signed)
Pt presents c/o ctx since 3:30 this morning. that have progressively got worse. Pt states she has some blooding show. Pt denies any NVD, or LOF. Reports positive fetal movement.

## 2017-01-28 NOTE — Progress Notes (Signed)
Patient ID: Mandy Romero, female   DOB: Dec 24, 1995, 21 y.o.   MRN: [id number] Pt seen - scant lochia.  C/O some mild abdominal cramping but otherwise doing well.

## 2017-01-29 LAB — RPR: RPR Ser Ql: NONREACTIVE

## 2017-01-29 NOTE — Progress Notes (Signed)
Patient ID: Mandy Romero, female   DOB: 16-Oct-1996, 21 y.o.   MRN: [id number]  Progress Note - Vaginal Delivery  Mandy Romero is a 21 y.o. [phone] now PP day 2 s/p Vaginal, Spontaneous Delivery .   Subjective:  The patient reports no complaints  Objective:  Vital signs in last 24 hours: Temp:  [97.6 F (36.4 C)-98.5 F (36.9 C)] 98 F (36.7 C) (04/12 1244) Pulse Rate:  [75-90] 84 (04/12 1244) Resp:  [18-20] 18 (04/12 1244) BP: (96-144)/(53-99) 100/60 (04/12 1244) SpO2:  [98 %-99 %] 98 % (04/12 0853)  Physical Exam:  General: no distress Lochia: appropriate  Uterine Fundus: firm   Data Review  Recent Labs  01/28/17 0810  HGB 10.4*  HCT 32.7*    Assessment/Plan: Active Problems:   Vaginal delivery  No evidence of retained products.  Pt having minimal bleeding.  Plan for discharge tomorrow  -- Continue routine PP care.     Elonda Husky, M.D. 01/29/2017 1:48 PM

## 2017-01-30 ENCOUNTER — Encounter: Payer: Medicaid Other | Admitting: Obstetrics and Gynecology

## 2017-01-30 NOTE — Discharge Summary (Signed)
                             Discharge Summary  Date of Admission: 01/28/2017  Date of Discharge:   Admitting Diagnosis: Onset of Labor at [redacted]w[redacted]d  Mode of Delivery: normal spontaneous vaginal delivery  Discharge Diagnosis: No other diagnosis   Intrapartum Procedures: Atificial rupture of membranes   Post partum procedures:  Manual removal of placenta with curettage  Complications: Retained placenta                      Discharge Day SOAP Note:  Progress Note - Vaginal Delivery  Raylyn Sherie Dobrowolski is a 21 y.o. [phone] now PP day 2 s/p Vaginal, Spontaneous Delivery . Delivery was complicated by Retained placenta with manual removal and curettage.  Subjective  The patient has the following complaints: has no unusual complaints  Pain is controlled with current medications.   Patient is urinating without difficulty.  She is ambulating well.    Objective  Vital signs: BP (!) 111/57 (BP Location: Right Arm)   Pulse 81   Temp 97.6 F (36.4 C) (Oral)   Resp 17   Ht 5' (1.524 m)   Wt 184 lb (83.5 kg)   LMP 05/24/2016 (Approximate)   SpO2 99%   Breastfeeding? Unknown   BMI 35.94 kg/m   Physical Exam: Gen: NAD Fundus Fundal Tone: Firm  Lochia Amount: Small  Perineum Appearance: Intact     Data Review Labs: CBC Latest Ref Rng & Units 01/28/2017 11/27/2016 11/13/2016  WBC 3.6 - 11.0 K/uL 10.6 8.4 6.6  Hemoglobin 12.0 - 16.0 g/dL 10.4(L) - -  Hematocrit 35.0 - 47.0 % 32.7(L) 32.6(L) 32.7(L)  Platelets 150 - 440 K/uL 296 281 313   O POS  Assessment/Plan  Active Problems:   Vaginal delivery    Plan for discharge today.   Discharge Instructions: Per After Visit Summary. Activity: Advance as tolerated. Pelvic rest for 6 weeks.  Also refer to After Visit Summary Diet: Regular Medications: Allergies as of 01/30/2017   No Known Allergies     Medication List    STOP taking these medications   acetaminophen 325 MG tablet Commonly known as:  TYLENOL   aspirin  EC 81 MG tablet   Butalbital-APAP-Caffeine 50-325-40 MG capsule     TAKE these medications   multivitamin-prenatal 27-0.8 MG Tabs tablet Take 1 tablet by mouth daily at 12 noon.      Outpatient follow up:  Postpartum contraception: IUD  Discharged Condition: good  Discharged to: home  Newborn Data: Disposition:home with mother  Apgars: APGAR (1 MIN): 8   APGAR (5 MINS): 9   APGAR (10 MINS):    Baby Feeding: Bottle    Elonda Husky, M.D. 01/30/2017 8:59 AM

## 2017-01-30 NOTE — Discharge Instructions (Signed)
Please call your doctor or return to the ER if you experience any chest pains, shortness of breath, dizziness, visual changes, fever greater than 101, any heavy bleeding (saturating more than 1 pad per hour), large clots, or foul smelling discharge, any worsening abdominal pain and cramping that is not controlled by pain medication, or any signs of postpartum depression. No tampons, enemas, douches, or sexual intercourse for 6 weeks. Also avoid tub baths, hot tubs, or swimming for 6 weeks.  ° ° ° ° ° °Home Care Instructions for Mom °ACTIVITY °· Gradually return to your regular activities. °· Let yourself rest. Nap while your baby sleeps. °· Avoid lifting anything that is heavier than 10 lb (4.5 kg) until your health care provider says it is okay. °· Avoid activities that take a lot of effort and energy (are strenuous) until approved by your health care provider. Walking at a slow-to-moderate pace is usually safe. °· If you had a cesarean delivery: °? Do not vacuum, climb stairs, or drive a car for 4-6 weeks. °? Have someone help you at home until you feel like you can do your usual activities yourself. °? Do exercises as told by your health care provider, if this applies. ° °VAGINAL BLEEDING °You may continue to bleed for 4-6 weeks after delivery. Over time, the amount of blood usually decreases and the color of the blood usually gets lighter. However, the flow of bright red blood may increase if you have been too active. If you need to use more than one pad in an hour because your pad gets soaked, or if you pass a large clot: °· Lie down. °· Raise your feet. °· Place a cold compress on your lower abdomen. °· Rest. °· Call your health care provider. ° °If you are breastfeeding, your period should return anytime between 8 weeks after delivery and the time that you stop breastfeeding. If you are not breastfeeding, your period should return 6-8 weeks after delivery. °PERINEAL CARE °The perineal area, or perineum, is  the part of your body between your thighs. After delivery, this area needs special care. Follow these instructions as told by your health care provider. °· Take warm tub baths for 15-20 minutes. °· Use medicated pads and pain-relieving sprays and creams as told. °· Do not use tampons or douches until vaginal bleeding has stopped. °· Each time you go to the bathroom: °? Use a peri bottle. °? Change your pad. °? Use towelettes in place of toilet paper until your stitches have healed. °· Do Kegel exercises every day. Kegel exercises help to maintain the muscles that support the vagina, bladder, and bowels. You can do these exercises while you are standing, sitting, or lying down. To do Kegel exercises: °? Tighten the muscles of your abdomen and the muscles that surround your birth canal. °? Hold for a few seconds. °? Relax. °? Repeat until you have done this 5 times in a row. °· To prevent hemorrhoids from developing or getting worse: °? Drink enough fluid to keep your urine clear or pale yellow. °? Avoid straining when having a bowel movement. °? Take over-the-counter medicines and stool softeners as told by your health care provider. ° °BREAST CARE °· Wear a tight-fitting bra. °· Avoid taking over-the-counter pain medicine for breast discomfort. °· Apply ice to the breasts to help with discomfort as needed: °? Put ice in a plastic bag. °? Place a towel between your skin and the bag. °? Leave the ice on for 20   minutes or as told by your health care provider. ° °NUTRITION °· Eat a well-balanced diet. °· Do not try to lose weight quickly by cutting back on calories. °· Take your prenatal vitamins until your postpartum checkup or until your health care provider tells you to stop. ° °POSTPARTUM DEPRESSION °You may find yourself crying for no apparent reason and unable to cope with all of the changes that come with having a newborn. This mood is called postpartum depression. Postpartum depression happens because your  hormone levels change after delivery. If you have postpartum depression, get support from your partner, friends, and family. If the depression does not go away on its own after several weeks, contact your health care provider. °BREAST SELF-EXAM °Do a breast self-exam each month, at the same time of the month. If you are breastfeeding, check your breasts just after a feeding, when your breasts are less full. If you are breastfeeding and your period has started, check your breasts on day 5, 6, or 7 of your period. °Report any lumps, bumps, or discharge to your health care provider. Know that breasts are normally lumpy if you are breastfeeding. This is temporary, and it is not a health risk. °INTIMACY AND SEXUALITY °Avoid sexual activity for at least 3-4 weeks after delivery or until the brownish-red vaginal flow is completely gone. If you want to avoid pregnancy, use some form of birth control. You can get pregnant after delivery, even if you have not had your period. °SEEK MEDICAL CARE IF: °· You feel unable to cope with the changes that a child brings to your life, and these feelings do not go away after several weeks. °· You notice a lump, a bump, or discharge on your breast. ° °SEEK IMMEDIATE MEDICAL CARE IF: °· Blood soaks your pad in 1 hour or less. °· You have: °? Severe pain or cramping in your lower abdomen. °? A bad-smelling vaginal discharge. °? A fever that is not controlled by medicine. °? A fever, and an area of your breast is red and sore. °? Pain or redness in your calf. °? Sudden, severe chest pain. °? Shortness of breath. °? Painful or bloody urination. °? Problems with your vision. °· You vomit for 12 hours or longer. °· You develop a severe headache. °· You have serious thoughts about hurting yourself, your child, or anyone else. ° °This information is not intended to replace advice given to you by your health care provider. Make sure you discuss any questions you have with your health care  provider. °Document Released: 10/03/2000 Document Revised: 03/13/2016 Document Reviewed: 04/09/2015 °Elsevier Interactive Patient Education © 2017 [employer] ° °

## 2017-01-30 NOTE — Progress Notes (Signed)
Discharge order received from doctor. Varicella vaccine offered to patient but patient declined.Reviewed discharge instructions and prescriptions with patient and answered all questions. Follow up appointment instructions given. Patient verbalized understanding. ID bands checked. Patient discharged home with infant via wheelchair by nursing/auxillary.    Oswald Hillock, RN

## 2017-02-02 ENCOUNTER — Telehealth: Payer: Self-pay

## 2017-02-02 DIAGNOSIS — R21 Rash and other nonspecific skin eruption: Secondary | ICD-10-CM

## 2017-02-02 MED ORDER — CLOBETASOL PROPIONATE 0.05 % EX CREA: 1.0000 "application " | TOPICAL_CREAM | Freq: Two times a day (BID) | CUTANEOUS | 0 refills | Status: DC

## 2017-02-02 NOTE — Telephone Encounter (Signed)
Pt calls and states that a rash she had previously has returned and she would like to have a refill sent in of the cream that she was prescribed to treat it. RX sent. Advised pt to call back if sx worsen or persist.

## 2017-02-03 ENCOUNTER — Encounter: Payer: Medicaid Other | Admitting: Obstetrics and Gynecology

## 2017-02-03 ENCOUNTER — Other Ambulatory Visit: Payer: Self-pay

## 2017-02-03 DIAGNOSIS — R21 Rash and other nonspecific skin eruption: Secondary | ICD-10-CM

## 2017-02-03 DIAGNOSIS — L299 Pruritus, unspecified: Secondary | ICD-10-CM

## 2017-02-03 MED ORDER — HYDROXYZINE HCL 25 MG PO TABS: 25.0000 mg | ORAL_TABLET | Freq: Three times a day (TID) | ORAL | 2 refills | Status: DC | PRN

## 2017-03-11 ENCOUNTER — Encounter: Payer: Medicaid Other | Admitting: Obstetrics and Gynecology

## 2017-03-20 ENCOUNTER — Encounter: Payer: Medicaid Other | Admitting: Obstetrics and Gynecology

## 2017-04-02 ENCOUNTER — Encounter: Payer: Medicaid Other | Admitting: Obstetrics and Gynecology

## 2017-06-10 ENCOUNTER — Encounter: Payer: Medicaid Other | Admitting: Obstetrics and Gynecology

## 2017-06-12 ENCOUNTER — Encounter: Payer: Medicaid Other | Admitting: Obstetrics and Gynecology

## 2018-08-04 LAB — HM HIV SCREENING LAB: HM HIV Screening: NEGATIVE

## 2018-08-04 LAB — HM PAP SMEAR: HM Pap smear: NEGATIVE

## 2019-03-15 ENCOUNTER — Encounter: Payer: Self-pay | Admitting: Emergency Medicine

## 2019-03-15 ENCOUNTER — Other Ambulatory Visit: Payer: Self-pay

## 2019-03-15 ENCOUNTER — Emergency Department
Admission: EM | Admit: 2019-03-15 | Discharge: 2019-03-15 | Disposition: A | Discharge status: Home / Self Care | Payer: Self-pay | Attending: Emergency Medicine | Admitting: Emergency Medicine

## 2019-03-15 DIAGNOSIS — Y990 Civilian activity done for income or pay: Secondary | ICD-10-CM | POA: Insufficient documentation

## 2019-03-15 DIAGNOSIS — L03116 Cellulitis of left lower limb: Secondary | ICD-10-CM | POA: Insufficient documentation

## 2019-03-15 DIAGNOSIS — S90562A Insect bite (nonvenomous), left ankle, initial encounter: Secondary | ICD-10-CM | POA: Insufficient documentation

## 2019-03-15 DIAGNOSIS — Y9389 Activity, other specified: Secondary | ICD-10-CM | POA: Insufficient documentation

## 2019-03-15 DIAGNOSIS — Y9289 Other specified places as the place of occurrence of the external cause: Secondary | ICD-10-CM | POA: Insufficient documentation

## 2019-03-15 DIAGNOSIS — W57XXXA Bitten or stung by nonvenomous insect and other nonvenomous arthropods, initial encounter: Secondary | ICD-10-CM | POA: Insufficient documentation

## 2019-03-15 MED ORDER — IBUPROFEN 800 MG PO TABS: 800.0000 mg | ORAL_TABLET | Freq: Three times a day (TID) | ORAL | 0 refills | Status: DC | PRN

## 2019-03-15 MED ORDER — IBUPROFEN 800 MG PO TABS
800.0000 mg | ORAL_TABLET | Freq: Once | ORAL | Status: AC
  Administered 2019-03-15: 800 mg via ORAL
  Filled 2019-03-15: qty 1

## 2019-03-15 MED ORDER — CEPHALEXIN 500 MG PO CAPS: 500.0000 mg | ORAL_CAPSULE | Freq: Two times a day (BID) | ORAL | 0 refills | Status: AC

## 2019-03-15 MED ORDER — CEPHALEXIN 500 MG PO CAPS
500.0000 mg | ORAL_CAPSULE | Freq: Once | ORAL | Status: AC
  Administered 2019-03-15: 500 mg via ORAL
  Filled 2019-03-15: qty 1

## 2019-03-15 NOTE — ED Notes (Signed)
See triage note  Presents with possible insect bite to left foot/ankle  States she felt it yesterday while at work.Mandy Romero

## 2019-03-15 NOTE — ED Triage Notes (Signed)
Pt reports something bit her on her left ankle at work yesterday and now it is swollen.

## 2019-03-15 NOTE — ED Provider Notes (Signed)
[facility] Emergency Department Provider Note    First MD Initiated Contact with Patient 03/15/19 1430     (approximate)  I have reviewed the triage vital signs and the nursing notes.   HISTORY  Chief Complaint Insect Bite    HPI Mandy Romero is a 23 y.o. female presents to the emergency department secondary to concern for insect bite on her left ankle which occurred yesterday.  Patient states that she was at work when she felt a sting to the left lateral malleoli.  Patient since then patient states that she has had continued swelling and redness in the area extending from the point of the bite.  She denies any fever afebrile on presentation.         Past Medical History:  Diagnosis Date  . Migraine   . Peritonsillar abscess   . Preterm labor     Patient Active Problem List   Diagnosis Date Noted  . Vaginal delivery 01/28/2017  . Vaginal bleeding in pregnancy, third trimester 01/23/2017  . Pregnancy 01/23/2017  . Labor and delivery indication for care or intervention 01/12/2017  . Migraine 01/08/2017  . Abnormal glucose tolerance in pregnancy 12/25/2016  . Pelvic pain in pregnancy, antepartum, third trimester 12/21/2016  . Headache in pregnancy 11/27/2016  . H/O preterm delivery, currently pregnant, third trimester 09/01/2016  . Late prenatal care affecting pregnancy in second trimester 09/01/2016  . Obesity (BMI 30-39.9) 08/30/2016  . Maternal varicella, non-immune 08/30/2016    Past Surgical History:  Procedure Laterality Date  . CHOLECYSTECTOMY      Prior to Admission medications   Not on File    Allergies Patient has no known allergies.  Family History  Problem Relation Age of Onset  . Diabetes Father   . Hypertension Father   . Cancer Maternal Aunt     Social History Social History   Tobacco Use  . Smoking status: Never Smoker  . Smokeless tobacco: Never Used  Substance Use Topics  . Alcohol use: No  .  Drug use: No    Review of Systems Constitutional: No fever/chills Eyes: No visual changes. ENT: No sore throat. Cardiovascular: Denies chest pain. Respiratory: Denies shortness of breath. Gastrointestinal: No abdominal pain.  No nausea, no vomiting.  No diarrhea.  No constipation. Genitourinary: Negative for dysuria. Musculoskeletal: Negative for neck pain.  Negative for back pain. Integumentary: Negative for rash.  There for insect bite to the left ankle Neurological: Negative for headaches, focal weakness or numbness.   ____________________________________________   PHYSICAL EXAM:  VITAL SIGNS: ED Triage Vitals  Enc Vitals Group     BP 03/15/19 1332 105/67     Pulse Rate 03/15/19 1332 65     Resp 03/15/19 1332 20     Temp 03/15/19 1332 98.5 F (36.9 C)     Temp Source 03/15/19 1332 Oral     SpO2 03/15/19 1332 98 %     Weight 03/15/19 1329 74.4 kg (164 lb)     Height 03/15/19 1329 1.524 m (5')     Head Circumference --      Peak Flow --      Pain Score 03/15/19 1329 8     Pain Loc --      Pain Edu? --      Excl. in GC? --     Constitutional: Alert and oriented. Well appearing and in no acute distress. Eyes: Conjunctivae are normal.  Mouth/Throat: Mucous membranes are moist. Oropharynx non-erythematous. Neck: No  stridor.   Cardiovascular: Normal rate, regular rhythm. Good peripheral circulation. Grossly normal heart sounds. Respiratory: Normal respiratory effort.  No retractions. No audible wheezing. Gastrointestinal: Soft and nontender. No distention.  Musculoskeletal: No lower extremity tenderness nor edema. No gross deformities of extremities. Neurologic:  Normal speech and language. No gross focal neurologic deficits are appreciated.  Skin: Nonpitting edema with associated blanching erythema around the left lateral malleoli. distinct blister noted posteriorly to the malleoli Psychiatric: Mood and affect are normal. Speech and behavior are normal.  ___________   Procedures   ____________________________________________   INITIAL IMPRESSION / MDM / ASSESSMENT AND PLAN / ED COURSE  As part of my medical decision making, I reviewed the following data within the electronic MEDICAL RECORD NUMBER   23-year-old female present with above-stated history and physical exam consistent with infected insect bite to the left ankle with associated cellulitis.  Patient given Keflex in the emergency department will be prescribed the same for home.  I advised the patient of warning signs that would warrant immediate return to the emergency department including worsening pain swelling or erythema.  Cellulitic area was outlined.  *Mandy Romero was evaluated in Emergency Department on 03/15/2019 for the symptoms described in the history of present illness. She was evaluated in the context of the global COVID-19 pandemic, which necessitated consideration that the patient might be at risk for infection with the SARS-CoV-2 virus that causes COVID-19. Institutional protocols and algorithms that pertain to the evaluation of patients at risk for COVID-19 are in a state of rapid change based on information released by regulatory bodies including the CDC and federal and state organizations. These policies and algorithms were followed during the patient's care in the ED.  Some ED evaluations and interventions may be delayed as a result of limited staffing during the pandemic.*       ____________________________________________  FINAL CLINICAL IMPRESSION(S) / ED DIAGNOSES  Final diagnoses:  Insect bite of left ankle, initial encounter  Cellulitis of left lower extremity     MEDICATIONS GIVEN DURING THIS VISIT:  Medications  cephALEXin (KEFLEX) capsule 500 mg (has no administration in time range)  ibuprofen (ADVIL) tablet 800 mg (has no administration in time range)     ED Discharge Orders    None       Note:  This document was prepared using Dragon voice  recognition software and may include unintentional dictation errors.   Darci CurrentBrown, Jameson N, MD 03/15/19 [phone]

## 2019-03-15 NOTE — ED Notes (Signed)
Wound outline in surgical marker on left ankle.

## 2020-02-06 ENCOUNTER — Ambulatory Visit: Payer: Self-pay

## 2020-04-03 ENCOUNTER — Emergency Department: Payer: Medicaid Other

## 2020-04-03 ENCOUNTER — Encounter: Payer: Self-pay | Admitting: Emergency Medicine

## 2020-04-03 ENCOUNTER — Other Ambulatory Visit: Payer: Self-pay

## 2020-04-03 ENCOUNTER — Emergency Department
Admission: EM | Admit: 2020-04-03 | Discharge: 2020-04-03 | Disposition: A | Discharge status: Home / Self Care | Payer: Medicaid Other | Attending: Student in an Organized Health Care Education/Training Program | Admitting: Student in an Organized Health Care Education/Training Program

## 2020-04-03 DIAGNOSIS — N3090 Cystitis, unspecified without hematuria: Secondary | ICD-10-CM | POA: Diagnosis not present

## 2020-04-03 DIAGNOSIS — N839 Noninflammatory disorder of ovary, fallopian tube and broad ligament, unspecified: Secondary | ICD-10-CM | POA: Insufficient documentation

## 2020-04-03 DIAGNOSIS — R102 Pelvic and perineal pain: Secondary | ICD-10-CM | POA: Diagnosis present

## 2020-04-03 DIAGNOSIS — N838 Other noninflammatory disorders of ovary, fallopian tube and broad ligament: Secondary | ICD-10-CM

## 2020-04-03 LAB — WET PREP, GENITAL
Clue Cells Wet Prep HPF POC: NONE SEEN
Sperm: NONE SEEN
Trich, Wet Prep: NONE SEEN
Yeast Wet Prep HPF POC: NONE SEEN

## 2020-04-03 LAB — URINALYSIS, COMPLETE (UACMP) WITH MICROSCOPIC
Bacteria, UA: NONE SEEN
Bilirubin Urine: NEGATIVE
Glucose, UA: NEGATIVE mg/dL
Ketones, ur: 5 mg/dL — AB
Nitrite: NEGATIVE
Protein, ur: NEGATIVE mg/dL
RBC / HPF: >50 RBC/hpf — ABNORMAL HIGH (ref 0–5)
Specific Gravity, Urine: 1.019 (ref 1.005–1.030)
pH: 5 (ref 5.0–8.0)

## 2020-04-03 LAB — CHLAMYDIA/NGC RT PCR (ARMC ONLY)
Chlamydia Tr: NOT DETECTED
N gonorrhoeae: NOT DETECTED

## 2020-04-03 LAB — POCT PREGNANCY, URINE: Preg Test, Ur: NEGATIVE

## 2020-04-03 MED ORDER — CEPHALEXIN 500 MG PO CAPS: 500.0000 mg | ORAL_CAPSULE | Freq: Three times a day (TID) | ORAL | 0 refills | Status: AC

## 2020-04-03 MED ORDER — OXYCODONE-ACETAMINOPHEN 5-325 MG PO TABS
1.0000 | ORAL_TABLET | Freq: Once | ORAL | Status: AC
  Administered 2020-04-03: 1 via ORAL
  Filled 2020-04-03: qty 1

## 2020-04-03 MED ORDER — LIDOCAINE HCL (PF) 1 % IJ SOLN
INTRAMUSCULAR | Status: AC
  Filled 2020-04-03: qty 5

## 2020-04-03 MED ORDER — AZITHROMYCIN 500 MG PO TABS
1000.0000 mg | ORAL_TABLET | Freq: Once | ORAL | Status: AC
  Administered 2020-04-03: 1000 mg via ORAL
  Filled 2020-04-03: qty 2

## 2020-04-03 MED ORDER — CEFTRIAXONE SODIUM 1 G IJ SOLR
1.0000 g | Freq: Once | INTRAMUSCULAR | Status: AC
  Administered 2020-04-03: 1 g via INTRAMUSCULAR
  Filled 2020-04-03: qty 10

## 2020-04-03 MED ORDER — OXYCODONE-ACETAMINOPHEN 5-325 MG PO TABS: 1.0000 | ORAL_TABLET | Freq: Three times a day (TID) | ORAL | 0 refills | Status: AC | PRN

## 2020-04-03 NOTE — ED Triage Notes (Signed)
Pt reports pelvic pain and pressure all day today. Denies vaginal discharge.

## 2020-04-03 NOTE — Discharge Instructions (Signed)
Your ultrasound shows you likely either had a hemorrhagic ovarian cyst, a dermoid or an endometrioma.  Please call Dr. Logan Bores tomorrow for a follow-up appointment.  There are some white blood cells on your urinalysis so I am treating you for a urinary tract infection as well.

## 2020-04-03 NOTE — ED Provider Notes (Signed)
[facility] Emergency Department Provider Note  ____________________________________________  Time seen: Approximately 5:52 PM  I have reviewed the triage vital signs and the nursing notes.   HISTORY  Chief Complaint Pelvic Pain    HPI Mandy Romero is a 24 y.o. female that presents to the emergency department for evaluation of suprapubic discomfort, pelvic pressure with urination, vaginal bleeding for 2 days.  Patient states that she was expecting her normal menstrual cycle on June 5 but it was late.  Patient states that it started yesterday as some spotting and has increased today like a normal menstrual cycle.  Suprapubic discomfort feels like cramping but this feels worse than her normal cramping. No fevers, vomiting, dysuria, urinary urgency, frequency, vaginal discharge.  Past Medical History:  Diagnosis Date   Migraine    Peritonsillar abscess    Preterm labor     Patient Active Problem List   Diagnosis Date Noted   Vaginal bleeding in pregnancy, third trimester 01/23/2017   Migraine 01/08/2017   Abnormal glucose tolerance in pregnancy 12/25/2016   Pelvic pain in pregnancy, antepartum, third trimester 12/21/2016   Headache in pregnancy 11/27/2016   H/O preterm delivery, currently pregnant, third trimester 09/01/2016   Late prenatal care affecting pregnancy in second trimester 09/01/2016   Obesity (BMI 30-39.9) 08/30/2016   Maternal varicella, non-immune 08/30/2016    Past Surgical History:  Procedure Laterality Date   CHOLECYSTECTOMY      Prior to Admission medications   Medication Sig Start Date End Date Taking? Authorizing Provider  cephALEXin (KEFLEX) 500 MG capsule Take 1 capsule (500 mg total) by mouth 3 (three) times daily for 7 days. 04/03/20 04/10/20  Enid Derry, PA-C  ibuprofen (ADVIL) 800 MG tablet Take 1 tablet (800 mg total) by mouth every 8 (eight) hours as needed. 03/15/19   Darci Current, MD   oxyCODONE-acetaminophen (PERCOCET) 5-325 MG tablet Take 1 tablet by mouth every 8 (eight) hours as needed for up to 2 days for severe pain. 04/03/20 04/05/20  Enid Derry, PA-C    Allergies Patient has no known allergies.  Family History  Problem Relation Age of Onset   Diabetes Father    Hypertension Father    Cancer Maternal Aunt    Gestational diabetes Sister     Social History Social History   Tobacco Use   Smoking status: Never Smoker   Smokeless tobacco: Never Used  Substance Use Topics   Alcohol use: No   Drug use: No     Review of Systems  Constitutional: No fever/chills Cardiovascular: No chest pain. Respiratory: No SOB. Gastrointestinal: No abdominal pain.  No nausea, no vomiting.  Genitourinary: Negative for dysuria. Musculoskeletal: Negative for musculoskeletal pain. Skin: Negative for rash, abrasions, lacerations, ecchymosis. Neurological: Negative for headaches   ____________________________________________   PHYSICAL EXAM:  VITAL SIGNS: ED Triage Vitals  Enc Vitals Group     BP 04/03/20 1751 120/78     Pulse Rate 04/03/20 1751 88     Resp 04/03/20 1751 18     Temp 04/03/20 1751 98 F (36.7 C)     Temp Source 04/03/20 1751 Oral     SpO2 04/03/20 1751 98 %     Weight 04/03/20 1634 182 lb (82.6 kg)     Height 04/03/20 1634 5' (1.524 m)     Head Circumference --      Peak Flow --      Pain Score 04/03/20 1634 9     Pain Loc --  Pain Edu? --      Excl. in GC? --      Constitutional: Alert and oriented. Well appearing and in no acute distress. Eyes: Conjunctivae are normal. PERRL. EOMI. Head: Atraumatic. ENT:      Ears:      Nose: No congestion/rhinnorhea.      Mouth/Throat: Mucous membranes are moist.  Neck: No stridor.   Cardiovascular: Normal rate, regular rhythm.  Good peripheral circulation. Respiratory: Normal respiratory effort without tachypnea or retractions. Lungs CTAB. Good air entry to the bases with no  decreased or absent breath sounds. Gastrointestinal: Bowel sounds 4 quadrants. Soft and nontender to palpation. No guarding or rigidity. No palpable masses. No distention. No CVA tenderness. Musculoskeletal: Full range of motion to all extremities. No gross deformities appreciated. Neurologic:  Normal speech and language. No gross focal neurologic deficits are appreciated.  Skin:  Skin is warm, dry and intact. No rash noted. Psychiatric: Mood and affect are normal. Speech and behavior are normal. Patient exhibits appropriate insight and judgement.   ____________________________________________   LABS (all labs ordered are listed, but only abnormal results are displayed)  Labs Reviewed  WET PREP, GENITAL - Abnormal; Notable for the following components:      Result Value   WBC, Wet Prep HPF POC FEW (*)    All other components within normal limits  URINALYSIS, COMPLETE (UACMP) WITH MICROSCOPIC - Abnormal; Notable for the following components:   Color, Urine YELLOW (*)    APPearance HAZY (*)    Hgb urine dipstick LARGE (*)    Ketones, ur 5 (*)    Leukocytes,Ua TRACE (*)    RBC / HPF >50 (*)    All other components within normal limits  CHLAMYDIA/NGC RT PCR ([facility] ONLY)  POC URINE PREG, ED  POCT PREGNANCY, URINE   ____________________________________________  EKG   ____________________________________________  RADIOLOGY   CT Renal Stone Study  Result Date: 04/03/2020 CLINICAL DATA:  Abdomen pain EXAM: CT ABDOMEN AND PELVIS WITHOUT CONTRAST TECHNIQUE: Multidetector CT imaging of the abdomen and pelvis was performed following the standard protocol without IV contrast. COMPARISON:  Pelvic ultrasound 04/03/2020 FINDINGS: Lower chest: No acute abnormality. Hepatobiliary: No focal liver abnormality is seen. Status post cholecystectomy. No biliary dilatation. Pancreas: Unremarkable. No pancreatic ductal dilatation or surrounding inflammatory changes. Spleen: Normal in size  without focal abnormality. Adrenals/Urinary Tract: Adrenal glands are normal. No hydronephrosis. Thick-walled urinary bladder but suboptimal distension. Stomach/Bowel: Stomach is within normal limits. Appendix appears normal. No evidence of bowel wall thickening, distention, or inflammatory changes. Vascular/Lymphatic: No significant vascular findings are present. No enlarged abdominal or pelvic lymph nodes. Reproductive: Uterus unremarkable. 4.4 cm hypodense left adnexal lesion, corresponding to complex cyst on sonography. Other: Small free fluid in the pelvis. Generalized haziness within the pelvic mesenteric fat. Musculoskeletal: No acute or significant osseous findings. IMPRESSION: 1. Negative for hydronephrosis or ureteral stone. Thick-walled urinary bladder likely due to under distension though cystitis could also be considered 2. Generalized edema/haziness within the pelvic mesenteric fat, suggesting nonspecific pelvic inflammatory process. 4.4 cm hypodense left adnexal lesion corresponding to the complex cyst on sonography with differential considerations as previously described. The appearance is not highly suggestive of an abscess. 3. Small amount of free fluid in the pelvis. Electronically Signed   By: Jasmine Pang M.D.   On: 04/03/2020 20:55   US PELVIC COMPLETE W TRANSVAGINAL AND TORSION R/O  Result Date: 04/03/2020 CLINICAL DATA:  Pelvic pain EXAM: TRANSABDOMINAL AND TRANSVAGINAL ULTRASOUND OF PELVIS DOPPLER ULTRASOUND OF  OVARIES TECHNIQUE: Both transabdominal and transvaginal ultrasound examinations of the pelvis were performed. Transabdominal technique was performed for global imaging of the pelvis including uterus, ovaries, adnexal regions, and pelvic cul-de-sac. It was necessary to proceed with endovaginal exam following the transabdominal exam to visualize the right ovary. Color and duplex Doppler ultrasound was utilized to evaluate blood flow to the ovaries. COMPARISON:  None. FINDINGS:  Uterus Measurements: 9.6 x 4.4 x 5.3 cm = volume: 118 mL. No fibroids or other mass visualized. Endometrium Thickness: 5 mm in thickness.  No focal abnormality visualized. Right ovary Measurements: 3.3 x 1.9 x 1.6 cm = volume: 5 mL. Normal appearance/no adnexal mass. Left ovary Measurements: 4.8 x 3.3 x 3.9 cm = volume: 32 mL. 3.6 cm complex heterogeneous structure in the left ovary which is at least in partial cystic as well as areas that are hyperechoic. This could reflect a dermoid, endometrioma, or hemorrhagic cyst. Pulsed Doppler evaluation of both ovaries demonstrates normal low-resistance arterial and venous waveforms. Other findings Small amount of free fluid in the pelvis. IMPRESSION: Complex lesion in the left ovary with cystic and hyperechoic components. Differential considerations would include hemorrhagic cyst, endometrioma or dermoid. This could be followed with repeat ultrasound in 3-6 months to assess for stability/resolution. Electronically Signed   By: Rolm Baptise M.D.   On: 04/03/2020 19:54    ____________________________________________    PROCEDURES  Procedure(s) performed:    Procedures    Medications  lidocaine (PF) (XYLOCAINE) 1 % injection (has no administration in time range)  oxyCODONE-acetaminophen (PERCOCET/ROXICET) 5-325 MG per tablet 1 tablet (1 tablet Oral Given 04/03/20 1820)  cefTRIAXone (ROCEPHIN) injection 1 g (1 g Intramuscular Given 04/03/20 2239)  azithromycin (ZITHROMAX) tablet 1,000 mg (1,000 mg Oral Given 04/03/20 2239)     ____________________________________________   INITIAL IMPRESSION / ASSESSMENT AND PLAN / ED COURSE  Pertinent labs & imaging results that were available during my care of the patient were reviewed by me and considered in my medical decision making (see chart for details).  Review of the [place] CSRS was performed in accordance of the [place] prior to dispensing any controlled drugs.   Patient's diagnosis is consistent with  cystitis, left ovarian mass and pelvic pain.  Wet prep shows some white blood cells.  Gonorrhea and Chlamydia are negative.  Urinalysis has hemoglobin, leukocytes, red blood cells, white blood cells.  Hemoglobin and red blood cells are likely due to patient's menstrual cycle.  There is a complex lesion in the left ovary with cystic and hyperechoic components, possibly a hemorrhagic cyst, endometrioma or dermoid.  CT scan shows some generalized edema, haziness within the pelvic mesenteric fat, suggesting a nonspecific inflammatory process.  CT and ultrasound results were reviewed with Dr. Amalia Hailey, who does not recommend any additional imaging or lab work and who will follow up with patient in clinic.  Patient states that pain has significantly improved in the emergency department.  Patient will be discharged home with prescriptions for Keflex for cystitis. Patient is to follow up with gynecology as directed. Patient is given ED precautions to return to the ED for any worsening or new symptoms.   Cameron was evaluated in Emergency Department on 04/03/2020 for the symptoms described in the history of present illness. She was evaluated in the context of the global COVID-19 pandemic, which necessitated consideration that the patient might be at risk for infection with the SARS-CoV-2 virus that causes COVID-19. Institutional protocols and algorithms that pertain to the evaluation of patients  at risk for COVID-19 are in a state of rapid change based on information released by regulatory bodies including the CDC and federal and state organizations. These policies and algorithms were followed during the patient's care in the ED.  ____________________________________________  FINAL CLINICAL IMPRESSION(S) / ED DIAGNOSES  Final diagnoses:  Pelvic pain  Ovarian mass, left  Cystitis      NEW MEDICATIONS STARTED DURING THIS VISIT:  ED Discharge Orders         Ordered    cephALEXin (KEFLEX) 500 MG  capsule  3 times daily     Discontinue  Reprint     04/03/20 2224    oxyCODONE-acetaminophen (PERCOCET) 5-325 MG tablet  Every 8 hours PRN     Discontinue  Reprint     04/03/20 2225              This chart was dictated using voice recognition software/Dragon. Despite best efforts to proofread, errors can occur which can change the meaning. Any change was purely unintentional.    Enid Derry, PA-C 04/03/20 2353    Willy Eddy, MD 04/06/20 1200

## 2020-04-05 ENCOUNTER — Ambulatory Visit (INDEPENDENT_AMBULATORY_CARE_PROVIDER_SITE_OTHER): Payer: Medicaid Other | Admitting: Obstetrics and Gynecology

## 2020-04-05 ENCOUNTER — Encounter: Payer: Self-pay | Admitting: Surgical

## 2020-04-05 ENCOUNTER — Encounter: Payer: Self-pay | Admitting: Obstetrics and Gynecology

## 2020-04-05 ENCOUNTER — Other Ambulatory Visit: Payer: Self-pay

## 2020-04-05 VITALS — BP 104/70 | HR 79 | Ht 60.0 in | Wt 187.4 lb

## 2020-04-05 DIAGNOSIS — N83292 Other ovarian cyst, left side: Secondary | ICD-10-CM

## 2020-04-05 DIAGNOSIS — R102 Pelvic and perineal pain: Secondary | ICD-10-CM

## 2020-04-05 DIAGNOSIS — N83299 Other ovarian cyst, unspecified side: Secondary | ICD-10-CM

## 2020-04-05 NOTE — Progress Notes (Signed)
HPI:      Mandy Romero is a 24 y.o. [phone] who LMP was Patient's last menstrual period was 04/02/2020.  Subjective:   She presents today as an ED follow-up visit.  She was seen for midline pelvic pain.  She was found to have a small complex left ovarian cyst.  Differential diagnosis was hemorrhagic cyst versus teratoma versus endometrioma. She presents today stating that she still has pelvic pain but is functioning with it.    Patient was not using anything for birth control/cycle control  Hx: The following portions of the patient's history were reviewed and updated as appropriate:             She  has a past medical history of Migraine, Peritonsillar abscess, and Preterm labor. She does not have any pertinent problems on file. She  has a past surgical history that includes Cholecystectomy. Her family history includes Cancer in her maternal aunt; Diabetes in her father; Gestational diabetes in her sister; Hypertension in her father. She  reports that she has never smoked. She has never used smokeless tobacco. She reports that she does not drink alcohol and does not use drugs. She has a current medication list which includes the following prescription(s): cephalexin, ibuprofen, and oxycodone-acetaminophen. She has No Known Allergies.       Review of Systems:  Review of Systems  Constitutional: Denied constitutional symptoms, night sweats, recent illness, fatigue, fever, insomnia and weight loss.  Eyes: Denied eye symptoms, eye pain, photophobia, vision change and visual disturbance.  Ears/Nose/Throat/Neck: Denied ear, nose, throat or neck symptoms, hearing loss, nasal discharge, sinus congestion and sore throat.  Cardiovascular: Denied cardiovascular symptoms, arrhythmia, chest pain/pressure, edema, exercise intolerance, orthopnea and palpitations.  Respiratory: Denied pulmonary symptoms, asthma, pleuritic pain, productive sputum, cough, dyspnea and wheezing.  Gastrointestinal:  Denied, gastro-esophageal reflux, melena, nausea and vomiting.  Genitourinary: See HPI for additional information.  Musculoskeletal: Denied musculoskeletal symptoms, stiffness, swelling, muscle weakness and myalgia.  Dermatologic: Denied dermatology symptoms, rash and scar.  Neurologic: Denied neurology symptoms, dizziness, headache, neck pain and syncope.  Psychiatric: Denied psychiatric symptoms, anxiety and depression.  Endocrine: Denied endocrine symptoms including hot flashes and night sweats.   Meds:   Current Outpatient Medications on File Prior to Visit  Medication Sig Dispense Refill  . cephALEXin (KEFLEX) 500 MG capsule Take 1 capsule (500 mg total) by mouth 3 (three) times daily for 7 days. 21 capsule 0  . ibuprofen (ADVIL) 800 MG tablet Take 1 tablet (800 mg total) by mouth every 8 (eight) hours as needed. 30 tablet 0  . oxyCODONE-acetaminophen (PERCOCET) 5-325 MG tablet Take 1 tablet by mouth every 8 (eight) hours as needed for up to 2 days for severe pain. 6 tablet 0   No current facility-administered medications on file prior to visit.    Objective:     Vitals:   04/05/20 0814  BP: 104/70  Pulse: 79              CT and ultrasound findings reviewed in detail  Reproductive: Uterus unremarkable. 4.4 cm hypodense left adnexal lesion, corresponding to complex cyst on sonography. Other: Small free fluid in the pelvis. Generalized haziness within the pelvic mesenteric fat. Musculoskeletal: No acute or significant osseous findings. IMPRESSION: 1. Negative for hydronephrosis or ureteral stone. Thick-walled urinary bladder likely due to under distension though cystitis could also be considered 2. Generalized edema/haziness within the pelvic mesenteric fat, suggesting nonspecific pelvic inflammatory process. 4.4 cm hypodense left adnexal lesion corresponding  to the complex cyst on sonography with differential considerations as previously described. The appearance is not highly  suggestive of an abscess. 3. Small amount of free fluid in the pelvis.  Left ovary Measurements: 4.8 x 3.3 x 3.9 cm = volume: 32 mL. 3.6 cm complex heterogeneous structure in the left ovary which is at least in partial cystic as well as areas that are hyperechoic. This could reflect a dermoid, endometrioma, or hemorrhagic cyst. Pulsed Doppler evaluation of both ovaries demonstrates normal low-resistance arterial and venous waveforms. Other findings Small amount of free fluid in the pelvis. IMPRESSION: Complex lesion in the left ovary with cystic and hyperechoic components. Differential considerations would include hemorrhagic cyst, endometrioma or dermoid. This could be followed with repeat ultrasound in 3-6 months to assess for stability/resolution.  Assessment:    [id number] Patient Active Problem List   Diagnosis Date Noted  . Vaginal bleeding in pregnancy, third trimester 01/23/2017  . Migraine 01/08/2017  . Abnormal glucose tolerance in pregnancy 12/25/2016  . Pelvic pain in pregnancy, antepartum, third trimester 12/21/2016  . Headache in pregnancy 11/27/2016  . H/O preterm delivery, currently pregnant, third trimester 09/01/2016  . Late prenatal care affecting pregnancy in second trimester 09/01/2016  . Obesity (BMI 30-39.9) 08/30/2016  . Maternal varicella, non-immune 08/30/2016     1. Complex ovarian cyst   2. Pelvic pain in female     Cyst very likely benign.  Patient able to function.   Plan:            1.  Discussed the differential diagnosis of ovarian cysts of this nature with the patient.  Use of NSAID S for pain relief discussed.  2.  Recommend follow-up ultrasound in 6 weeks.  To consider laparoscopic surgery should cyst increase in size or differentiate itself from a hemorrhagic cyst.  3.  Patient to contact us if symptoms worsen and she cannot function with the pelvic discomfort. Orders No orders of the defined types were placed in this encounter.   No orders of the  defined types were placed in this encounter.     F/U  Return in about 6 weeks (around 05/17/2020) for Pt to contact us if symptoms worsen. I spent 31 minutes involved in the care of this patient preparing to see the patient by obtaining and reviewing her medical history (including labs, imaging tests and prior procedures), documenting clinical information in the electronic health record (EHR), counseling and coordinating care plans, writing and sending prescriptions, ordering tests or procedures and directly communicating with the patient by discussing pertinent items from her history and physical exam as well as detailing my assessment and plan as noted above so that she has an informed understanding.  All of her questions were answered.  Elonda Husky, M.D. 04/05/2020 8:39 AM

## 2020-05-15 ENCOUNTER — Ambulatory Visit (INDEPENDENT_AMBULATORY_CARE_PROVIDER_SITE_OTHER): Payer: Medicaid Other

## 2020-05-15 ENCOUNTER — Other Ambulatory Visit: Payer: Self-pay | Admitting: Obstetrics and Gynecology

## 2020-05-15 DIAGNOSIS — R52 Pain, unspecified: Secondary | ICD-10-CM

## 2020-05-17 ENCOUNTER — Other Ambulatory Visit: Payer: Medicaid Other

## 2020-05-22 ENCOUNTER — Telehealth: Payer: Self-pay | Admitting: Obstetrics and Gynecology

## 2020-05-22 NOTE — Telephone Encounter (Signed)
Please advise on results

## 2020-05-22 NOTE — Telephone Encounter (Signed)
Patient called in stating that she had an ultrasound done last week and that the provider was going to call her with the results however she hasn't heard anything back. Could you please advise?

## 2020-05-24 NOTE — Telephone Encounter (Signed)
Scheduled patient to come in tomorrow to be seen.

## 2020-05-25 ENCOUNTER — Ambulatory Visit: Payer: Medicaid Other | Admitting: Obstetrics and Gynecology

## 2020-06-05 ENCOUNTER — Encounter: Payer: Self-pay | Admitting: Obstetrics and Gynecology

## 2020-06-05 ENCOUNTER — Other Ambulatory Visit: Payer: Self-pay

## 2020-06-05 ENCOUNTER — Ambulatory Visit (INDEPENDENT_AMBULATORY_CARE_PROVIDER_SITE_OTHER): Payer: Medicaid Other | Admitting: Obstetrics and Gynecology

## 2020-06-05 VITALS — BP 106/68 | HR 71 | Ht 60.0 in | Wt 189.9 lb

## 2020-06-05 DIAGNOSIS — R102 Pelvic and perineal pain: Secondary | ICD-10-CM

## 2020-06-05 DIAGNOSIS — N83299 Other ovarian cyst, unspecified side: Secondary | ICD-10-CM

## 2020-06-05 NOTE — Progress Notes (Signed)
HPI:      [name] is a 24 y.o. [phone] who LMP was Patient's last menstrual period was 05/09/2020.  Subjective:   She presents today for follow-up of an ovarian cyst.  Patient states that her pain is getting more consistent.  She is having pain with exertion, bowel movements, occasional pain with urination.  It is all located on her left side low in her pelvis. She recently underwent an ultrasound follow-up of her left ovarian complex cyst.    Hx: The following portions of the patient's history were reviewed and updated as appropriate:             She  has a past medical history of Migraine, Peritonsillar abscess, and Preterm labor. She does not have any pertinent problems on file. She  has a past surgical history that includes Cholecystectomy. Her family history includes Cancer in her maternal aunt; Diabetes in her father; Gestational diabetes in her sister; Hypertension in her father. She  reports that she has never smoked. She has never used smokeless tobacco. She reports that she does not drink alcohol and does not use drugs. She has a current medication list which includes the following prescription(s): ibuprofen. She has No Known Allergies.       Review of Systems:  Review of Systems  Constitutional: Denied constitutional symptoms, night sweats, recent illness, fatigue, fever, insomnia and weight loss.  Eyes: Denied eye symptoms, eye pain, photophobia, vision change and visual disturbance.  Ears/Nose/Throat/Neck: Denied ear, nose, throat or neck symptoms, hearing loss, nasal discharge, sinus congestion and sore throat.  Cardiovascular: Denied cardiovascular symptoms, arrhythmia, chest pain/pressure, edema, exercise intolerance, orthopnea and palpitations.  Respiratory: Denied pulmonary symptoms, asthma, pleuritic pain, productive sputum, cough, dyspnea and wheezing.  Gastrointestinal: Denied, gastro-esophageal reflux, melena, nausea and vomiting.  Genitourinary: See  HPI for additional information.  Musculoskeletal: Denied musculoskeletal symptoms, stiffness, swelling, muscle weakness and myalgia.  Dermatologic: Denied dermatology symptoms, rash and scar.  Neurologic: Denied neurology symptoms, dizziness, headache, neck pain and syncope.  Psychiatric: Denied psychiatric symptoms, anxiety and depression.  Endocrine: Denied endocrine symptoms including hot flashes and night sweats.   Meds:   Current Outpatient Medications on File Prior to Visit  Medication Sig Dispense Refill   ibuprofen (ADVIL) 800 MG tablet Take 1 tablet (800 mg total) by mouth every 8 (eight) hours as needed. 30 tablet 0   No current facility-administered medications on file prior to visit.    Objective:     Vitals:   06/05/20 1054  BP: 106/68  Pulse: 71              US Findings:  The uterus is anteverted and measures 8.5 x 4.5 x 5.2 cm. Echo texture is homogenous without evidence of focal masses.  The Endometrium measures 6 mm.  Right Ovary measures 3.5 x 2.1 x 3.3 cm. It is normal in appearance. Left Ovary measures 6.4 x 4.2 x 5.2 cm. It is not normal in appearance. Lt ovarian cystic structure measuring 5.9 x 4. X 4.6 cm. Echogenic echoes throughout lesion with smooth boarders and through transmission is seen. This could suggest hemorraghic cyst VS solid lesion. There are no hypoechoic areas with-in this lesion that was seen on prior exam.  Survey of the adnexa demonstrates no adnexal masses. There is no free fluid in the cul de sac.  Impression: 1. Complex lesion in the left ovary that has been described above and appears larger at this time.  Assessment:  [id number] Patient Active Problem List   Diagnosis Date Noted   Vaginal bleeding in pregnancy, third trimester 01/23/2017   Migraine 01/08/2017   Abnormal glucose tolerance in pregnancy 12/25/2016   Pelvic pain in pregnancy, antepartum, third trimester 12/21/2016   Headache in pregnancy 11/27/2016    H/O preterm delivery, currently pregnant, third trimester 09/01/2016   Late prenatal care affecting pregnancy in second trimester 09/01/2016   Obesity (BMI 30-39.9) 08/30/2016   Maternal varicella, non-immune 08/30/2016     1. Complex ovarian cyst   2. Pelvic pain in female     Her pain is very likely secondary to her complex cyst.  She is in fact requesting definitive surgery.  Based on the appearance and the continued increase in size suspect teratoma.   Plan:            1.  We have discussed her left ovarian cyst and its change in size.  Different strategies have been reviewed but patient is requesting definitive surgery.  I have recommended ROMA testing just to be sure but suspect that laparoscopic LSO will be indicated.  Orders Orders Placed This Encounter  Procedures   Ovarian Malignancy Risk-ROMA    No orders of the defined types were placed in this encounter.     F/U  Return in about 3 weeks (around 06/26/2020) for We will contact her with any abnormal test results. I spent 22 minutes involved in the care of this patient preparing to see the patient by obtaining and reviewing her medical history (including labs, imaging tests and prior procedures), documenting clinical information in the electronic health record (EHR), counseling and coordinating care plans, writing and sending prescriptions, ordering tests or procedures and directly communicating with the patient by discussing pertinent items from her history and physical exam as well as detailing my assessment and plan as noted above so that she has an informed understanding.  All of her questions were answered.  Elonda Husky, M.D. 06/05/2020 11:38 AM

## 2020-06-06 LAB — PREMENOPAUSAL INTERP: LOW

## 2020-06-06 LAB — OVARIAN MALIGNANCY RISK-ROMA
Cancer Antigen (CA) 125: 17 U/mL (ref 0.0–38.1)
HE4: 35.3 pmol/L (ref 0.0–61.2)
Postmenopausal ROMA: 0.9
Premenopausal ROMA: 0.34

## 2020-06-06 LAB — POSTMENOPAUSAL INTERP: LOW

## 2020-06-27 ENCOUNTER — Ambulatory Visit (INDEPENDENT_AMBULATORY_CARE_PROVIDER_SITE_OTHER): Payer: Medicaid Other | Admitting: Obstetrics and Gynecology

## 2020-06-27 ENCOUNTER — Encounter: Payer: Self-pay | Admitting: Obstetrics and Gynecology

## 2020-06-27 ENCOUNTER — Other Ambulatory Visit: Payer: Self-pay

## 2020-06-27 VITALS — BP 96/61 | HR 69 | Ht 60.0 in | Wt 190.1 lb

## 2020-06-27 DIAGNOSIS — Z01818 Encounter for other preprocedural examination: Secondary | ICD-10-CM | POA: Diagnosis not present

## 2020-06-27 DIAGNOSIS — R102 Pelvic and perineal pain: Secondary | ICD-10-CM | POA: Diagnosis not present

## 2020-06-27 DIAGNOSIS — N83299 Other ovarian cyst, unspecified side: Secondary | ICD-10-CM

## 2020-06-27 NOTE — Progress Notes (Signed)
PRE-OPERATIVE HISTORY AND PHYSICAL EXAM  PCP:  Patient, No Pcp Per Subjective:   HPI:  Mandy Romero is a 24 y.o. [phone].  Patient's last menstrual period was 06/09/2020 (approximate).  She presents today for a pre-op discussion and PE.  She has the following symptoms: Patient has pelvic pain which is worsening.  It is especially bad with her menstrual periods.  An ultrasound reveals a complex left ovarian cyst.  Roma score low for possibility of malignancy.  Review of Systems:   Constitutional: Denied constitutional symptoms, night sweats, recent illness, fatigue, fever, insomnia and weight loss.  Eyes: Denied eye symptoms, eye pain, photophobia, vision change and visual disturbance.  Ears/Nose/Throat/Neck: Denied ear, nose, throat or neck symptoms, hearing loss, nasal discharge, sinus congestion and sore throat.  Cardiovascular: Denied cardiovascular symptoms, arrhythmia, chest pain/pressure, edema, exercise intolerance, orthopnea and palpitations.  Respiratory: Denied pulmonary symptoms, asthma, pleuritic pain, productive sputum, cough, dyspnea and wheezing.  Gastrointestinal: Denied, gastro-esophageal reflux, melena, nausea and vomiting.  Genitourinary: See HPI for additional information.  Musculoskeletal: Denied musculoskeletal symptoms, stiffness, swelling, muscle weakness and myalgia.  Dermatologic: Denied dermatology symptoms, rash and scar.  Neurologic: Denied neurology symptoms, dizziness, headache, neck pain and syncope.  Psychiatric: Denied psychiatric symptoms, anxiety and depression.  Endocrine: Denied endocrine symptoms including hot flashes and night sweats.   OB History  Gravida Para Term Preterm AB Living  3 3 1 2   3   SAB TAB Ectopic Multiple Live Births        0 3    # Outcome Date GA Lbr Len/2nd Weight Sex Delivery Anes PTL Lv  3 Term 01/28/17 [redacted]w[redacted]d  6 lb 15.5 oz (3.16 kg) M Vag-Spont Other  LIV  2 Preterm 2016 [redacted]w[redacted]d  6 lb 12 oz (3.062 kg) M  Vag-Spont None  LIV  1 Preterm 2013 [redacted]w[redacted]d  6 lb 11 oz (3.033 kg) M Vag-Spont None  LIV    Past Medical History:  Diagnosis Date  . Migraine   . Peritonsillar abscess   . Preterm labor     Past Surgical History:  Procedure Laterality Date  . CHOLECYSTECTOMY        SOCIAL HISTORY:  Social History   Tobacco Use  Smoking Status Never Smoker  Smokeless Tobacco Never Used   Social History   Substance and Sexual Activity  Alcohol Use No    Social History   Substance and Sexual Activity  Drug Use No    Family History  Problem Relation Age of Onset  . Diabetes Father   . Hypertension Father   . Cancer Maternal Aunt   . Gestational diabetes Sister     ALLERGIES:  Patient has no known allergies.  MEDS:   Current Outpatient Medications on File Prior to Visit  Medication Sig Dispense Refill  . ibuprofen (ADVIL) 800 MG tablet Take 1 tablet (800 mg total) by mouth every 8 (eight) hours as needed. (Patient not taking: Reported on 06/27/2020) 30 tablet 0   No current facility-administered medications on file prior to visit.    No orders of the defined types were placed in this encounter.    Physical examination BP 96/61   Pulse 69   Ht 5' (1.524 m)   Wt 190 lb 1 oz (86.2 kg)   LMP 06/09/2020 (Approximate)   BMI 37.12 kg/m   General NAD, Conversant  HEENT Atraumatic; Op clear with mmm.  Normo-cephalic. Pupils reactive. Anicteric sclerae  Thyroid/Neck Smooth without nodularity or enlargement.  Normal ROM.  Neck Supple.  Skin No rashes, lesions or ulceration. Normal palpated skin turgor. No nodularity.  Breasts: No masses or discharge.  Symmetric.  No axillary adenopathy.  Lungs: Clear to auscultation.No rales or wheezes. Normal Respiratory effort, no retractions.  Heart: NSR.  No murmurs or rubs appreciated. No periferal edema  Abdomen: Soft.  Non-tender.  No masses.  No HSM. No hernia  Extremities: Moves all appropriately.  Normal ROM for age. No lymphadenopathy.   Neuro: Oriented to PPT.  Normal mood. Normal affect.   Pelvic :  Right Ovary measures 3.5 x 2.1 x 3.3 cm. It is normal in appearance. Left Ovary measures 6.4 x 4.2 x 5.2 cm. It is not normal in appearance. Lt ovarian cystic structure measuring 5.9 x 4. X 4.6 cm. Echogenic echoes throughout lesion with smooth boarders and through transmission is seen. This could suggest hemorraghic cyst VS solid lesion. There are no hypoechoic areas with-in this lesion that was seen on prior exam.  Survey of the adnexa demonstrates no adnexal masses. There is no free fluid in the cul de sac.  Impression: 1. Complex lesion in the left ovary that has been described above and appears larger at this time.  Assessment:   [id number] Patient Active Problem List   Diagnosis Date Noted  . Vaginal bleeding in pregnancy, third trimester 01/23/2017  . Migraine 01/08/2017  . Abnormal glucose tolerance in pregnancy 12/25/2016  . Pelvic pain in pregnancy, antepartum, third trimester 12/21/2016  . Headache in pregnancy 11/27/2016  . H/O preterm delivery, currently pregnant, third trimester 09/01/2016  . Late prenatal care affecting pregnancy in second trimester 09/01/2016  . Obesity (BMI 30-39.9) 08/30/2016  . Maternal varicella, non-immune 08/30/2016    1. Preop examination   2. Complex ovarian cyst   3. Pelvic pain in female      Plan:   Orders: No orders of the defined types were placed in this encounter.    1.  Exploratory laparoscopy-left oophorectomy via laparoscopy.  Pre-op discussions regarding Risks and Benefits of her scheduled surgery.  Exp Lyp Mass We have discussed the procedure of Exploratory Laparoscopy in detail.  She is aware that her pain is most likely from her pelvic mass.  Medical management and expectant management of pelvic pain and pelvic masses has also been discussed and the patient has decided that this option is not satisfactory for her.  She has elected to have a  Laparoscopy performed to attempt to diagnose and manage her pain and mass.  She has been informed that Oophorectomy may be necessary and that a small decrease in fertility may result.  I have informed her that Laparoscopy, like other surgical procedures, entails the following risks:  bleeding, infection, damage to bowel, bladder or other internal organ, and the risk or anesthesia.  She is aware that her risks are not limited to these.  We have discussed the possibility of Endometriosis and Pelvic Adhesive Disease.  We have discussed surgical as well as subsequent medical management of these conditions.  The patient is aware that both of these conditions can often be diagnosed and treated via Laparoscopy.  We have discussed the possibility  of recurrence and that despite adequate treatment at this time, pelvic pain may reoccur in the future.   I have answered all of her questions and I believe she has been well informed regarding the risks/benefits of Exploratory Laparoscopy for pelvic pain and pelvic mass.  I spent 31 minutes involved in the care of this  patient preparing to see the patient by obtaining and reviewing her medical history (including labs, imaging tests and prior procedures), documenting clinical information in the electronic health record (EHR), counseling and coordinating care plans, writing and sending prescriptions, ordering tests or procedures and directly communicating with the patient by discussing pertinent items from her history and physical exam as well as detailing my assessment and plan as noted above so that she has an informed understanding.  All of her questions were answered.  Elonda Husky, M.D. 06/27/2020 12:19 PM

## 2020-06-27 NOTE — H&P (Signed)
May SO far behind the exposed have a specific lunch from event I have a patient in labor subset of I had      PRE-OPERATIVE HISTORY AND PHYSICAL EXAM  PCP:  Patient, No Pcp Per Subjective:   HPI:  Mandy Romero is a 24 y.o. [phone].  Patient's last menstrual period was 06/09/2020 (approximate).  She presents today for a pre-op discussion and PE.  She has the following symptoms: Patient has pelvic pain which is worsening.  It is especially bad with her menstrual periods.  An ultrasound reveals a complex left ovarian cyst.  Roma score low for possibility of malignancy.  Review of Systems:   Constitutional: Denied constitutional symptoms, night sweats, recent illness, fatigue, fever, insomnia and weight loss.  Eyes: Denied eye symptoms, eye pain, photophobia, vision change and visual disturbance.  Ears/Nose/Throat/Neck: Denied ear, nose, throat or neck symptoms, hearing loss, nasal discharge, sinus congestion and sore throat.  Cardiovascular: Denied cardiovascular symptoms, arrhythmia, chest pain/pressure, edema, exercise intolerance, orthopnea and palpitations.  Respiratory: Denied pulmonary symptoms, asthma, pleuritic pain, productive sputum, cough, dyspnea and wheezing.  Gastrointestinal: Denied, gastro-esophageal reflux, melena, nausea and vomiting.  Genitourinary: See HPI for additional information.  Musculoskeletal: Denied musculoskeletal symptoms, stiffness, swelling, muscle weakness and myalgia.  Dermatologic: Denied dermatology symptoms, rash and scar.  Neurologic: Denied neurology symptoms, dizziness, headache, neck pain and syncope.  Psychiatric: Denied psychiatric symptoms, anxiety and depression.  Endocrine: Denied endocrine symptoms including hot flashes and night sweats.   OB History  Gravida Para Term Preterm AB Living  3 3 1 2   3   SAB TAB Ectopic Multiple Live Births        0 3    # Outcome Date GA Lbr Len/2nd Weight Sex Delivery Anes PTL Lv  3 Term 01/28/17  [redacted]w[redacted]d  6 lb 15.5 oz (3.16 kg) M Vag-Spont Other  LIV  2 Preterm 2016 [redacted]w[redacted]d  6 lb 12 oz (3.062 kg) M Vag-Spont None  LIV  1 Preterm 2013 [redacted]w[redacted]d  6 lb 11 oz (3.033 kg) M Vag-Spont None  LIV    Past Medical History:  Diagnosis Date  . Migraine   . Peritonsillar abscess   . Preterm labor     Past Surgical History:  Procedure Laterality Date  . CHOLECYSTECTOMY        SOCIAL HISTORY:  Social History   Tobacco Use  Smoking Status Never Smoker  Smokeless Tobacco Never Used   Social History   Substance and Sexual Activity  Alcohol Use No    Social History   Substance and Sexual Activity  Drug Use No    Family History  Problem Relation Age of Onset  . Diabetes Father   . Hypertension Father   . Cancer Maternal Aunt   . Gestational diabetes Sister     ALLERGIES:  Patient has no known allergies.  MEDS:   Current Outpatient Medications on File Prior to Visit  Medication Sig Dispense Refill  . ibuprofen (ADVIL) 800 MG tablet Take 1 tablet (800 mg total) by mouth every 8 (eight) hours as needed. (Patient not taking: Reported on 06/27/2020) 30 tablet 0   No current facility-administered medications on file prior to visit.    No orders of the defined types were placed in this encounter.    Physical examination BP 96/61   Pulse 69   Ht 5' (1.524 m)   Wt 190 lb 1 oz (86.2 kg)   LMP 06/09/2020 (Approximate)   BMI 37.12 kg/m   General  NAD, Conversant  HEENT Atraumatic; Op clear with mmm.  Normo-cephalic. Pupils reactive. Anicteric sclerae  Thyroid/Neck Smooth without nodularity or enlargement. Normal ROM.  Neck Supple.  Skin No rashes, lesions or ulceration. Normal palpated skin turgor. No nodularity.  Breasts: No masses or discharge.  Symmetric.  No axillary adenopathy.  Lungs: Clear to auscultation.No rales or wheezes. Normal Respiratory effort, no retractions.  Heart: NSR.  No murmurs or rubs appreciated. No periferal edema  Abdomen: Soft.  Non-tender.  No  masses.  No HSM. No hernia  Extremities: Moves all appropriately.  Normal ROM for age. No lymphadenopathy.  Neuro: Oriented to PPT.  Normal mood. Normal affect.   Pelvic :  Right Ovary measures 3.5 x 2.1 x 3.3 cm. It is normal in appearance. Left Ovary measures 6.4 x 4.2 x 5.2 cm. It is not normal in appearance. Lt ovarian cystic structure measuring 5.9 x 4. X 4.6 cm. Echogenic echoes throughout lesion with smooth boarders and through transmission is seen. This could suggest hemorraghic cyst VS solid lesion. There are no hypoechoic areas with-in this lesion that was seen on prior exam.  Survey of the adnexa demonstrates no adnexal masses. There is no free fluid in the cul de sac.  Impression: 1. Complex lesion in the left ovary that has been described above and appears larger at this time.  Assessment:   [id number] Patient Active Problem List   Diagnosis Date Noted  . Vaginal bleeding in pregnancy, third trimester 01/23/2017  . Migraine 01/08/2017  . Abnormal glucose tolerance in pregnancy 12/25/2016  . Pelvic pain in pregnancy, antepartum, third trimester 12/21/2016  . Headache in pregnancy 11/27/2016  . H/O preterm delivery, currently pregnant, third trimester 09/01/2016  . Late prenatal care affecting pregnancy in second trimester 09/01/2016  . Obesity (BMI 30-39.9) 08/30/2016  . Maternal varicella, non-immune 08/30/2016    1. Preop examination   2. Complex ovarian cyst   3. Pelvic pain in female      Plan:   Orders: No orders of the defined types were placed in this encounter.    1.  Exploratory laparoscopy-left oophorectomy via laparoscopy.

## 2020-07-17 ENCOUNTER — Telehealth: Payer: Self-pay

## 2020-07-17 NOTE — Telephone Encounter (Signed)
Pt called in and stated that she didn't know what day her surgery will be.  I told her the date and I told her that the hospital will all her for her Covid testing. The pt verbally understood.

## 2020-07-21 ENCOUNTER — Emergency Department: Payer: Medicaid Other

## 2020-07-21 ENCOUNTER — Encounter: Payer: Self-pay | Admitting: Emergency Medicine

## 2020-07-21 ENCOUNTER — Emergency Department
Admission: EM | Admit: 2020-07-21 | Discharge: 2020-07-21 | Disposition: A | Discharge status: Home / Self Care | Payer: Medicaid Other | Attending: Emergency Medicine | Admitting: Emergency Medicine

## 2020-07-21 ENCOUNTER — Other Ambulatory Visit: Payer: Self-pay

## 2020-07-21 DIAGNOSIS — N83202 Unspecified ovarian cyst, left side: Secondary | ICD-10-CM

## 2020-07-21 DIAGNOSIS — R1032 Left lower quadrant pain: Secondary | ICD-10-CM | POA: Diagnosis present

## 2020-07-21 DIAGNOSIS — R102 Pelvic and perineal pain: Secondary | ICD-10-CM

## 2020-07-21 LAB — URINALYSIS, COMPLETE (UACMP) WITH MICROSCOPIC
Bacteria, UA: NONE SEEN
Bilirubin Urine: NEGATIVE
Glucose, UA: NEGATIVE mg/dL
Hgb urine dipstick: NEGATIVE
Ketones, ur: NEGATIVE mg/dL
Leukocytes,Ua: NEGATIVE
Nitrite: NEGATIVE
Protein, ur: NEGATIVE mg/dL
Specific Gravity, Urine: 1.014 (ref 1.005–1.030)
pH: 5 (ref 5.0–8.0)

## 2020-07-21 LAB — COMPREHENSIVE METABOLIC PANEL
ALT: 35 U/L (ref 0–44)
AST: 26 U/L (ref 15–41)
Albumin: 4.3 g/dL (ref 3.5–5.0)
Alkaline Phosphatase: 71 U/L (ref 38–126)
Anion gap: 8 (ref 5–15)
BUN: 8 mg/dL (ref 6–20)
CO2: 22 mmol/L (ref 22–32)
Calcium: 9 mg/dL (ref 8.9–10.3)
Chloride: 105 mmol/L (ref 98–111)
Creatinine, Ser: 0.53 mg/dL (ref 0.44–1.00)
GFR calc Af Amer: >60 mL/min (ref >60)
GFR calc non Af Amer: >60 mL/min (ref >60)
Glucose, Bld: 100 mg/dL — ABNORMAL HIGH (ref 70–99)
Potassium: 3.9 mmol/L (ref 3.5–5.1)
Sodium: 135 mmol/L (ref 135–145)
Total Bilirubin: 0.6 mg/dL (ref 0.3–1.2)
Total Protein: 8 g/dL (ref 6.5–8.1)

## 2020-07-21 LAB — CBC
HCT: 41.3 % (ref 36.0–46.0)
Hemoglobin: 14.2 g/dL (ref 12.0–15.0)
MCH: 29.1 pg (ref 26.0–34.0)
MCHC: 34.4 g/dL (ref 30.0–36.0)
MCV: 84.6 fL (ref 80.0–100.0)
Platelets: 302 10*3/uL (ref 150–400)
RBC: 4.88 MIL/uL (ref 3.87–5.11)
RDW: 12.7 % (ref 11.5–15.5)
WBC: 7.3 10*3/uL (ref 4.0–10.5)
nRBC: 0 % (ref 0.0–0.2)

## 2020-07-21 LAB — LIPASE, BLOOD: Lipase: 32 U/L (ref 11–51)

## 2020-07-21 LAB — POCT PREGNANCY, URINE: Preg Test, Ur: NEGATIVE

## 2020-07-21 MED ORDER — KETOROLAC TROMETHAMINE 30 MG/ML IJ SOLN
30.0000 mg | Freq: Once | INTRAMUSCULAR | Status: AC
  Administered 2020-07-21: 30 mg via INTRAMUSCULAR
  Filled 2020-07-21: qty 1

## 2020-07-21 MED ORDER — KETOROLAC TROMETHAMINE 10 MG PO TABS: 10.0000 mg | ORAL_TABLET | Freq: Four times a day (QID) | ORAL | 0 refills | Status: DC | PRN

## 2020-07-21 NOTE — ED Notes (Signed)
Pt alert and oriented X 4, stable for discharge. RR even and unlabored, color WNL. Discussed discharge instructions and follow-up as directed. Discharge medications discussed if provided. Pt had opportunity to ask questions if necessary and RN to provide patient/family eduction.  

## 2020-07-21 NOTE — ED Provider Notes (Signed)
[facility] Emergency Department Provider Note  ____________________________________________  Time seen: Approximately 1:44 PM  I have reviewed the triage vital signs and the nursing notes.   HISTORY  Chief Complaint Abdominal Pain    HPI Mandy Romero is a 24 y.o. female with a left ovarian cyst and scheduled for removal on October 11th that presents to the emergency department for evaluation of left lower quadrant pain worse for 3 days.  Patient states that pain has been chronic for several months.  Usually it is resolved with ibuprofen.  Pain has been constant over 3 days and unrelieved with ibuprofen.  No urinary symptoms.  No fever, nausea, vomiting, vaginal bleeding.  Patient sees Dr. Logan Bores for the cyst and was unable to get a hold of him today due to it being a weekend.  Past Medical History:  Diagnosis Date  . Migraine   . Peritonsillar abscess   . Preterm labor     Patient Active Problem List   Diagnosis Date Noted  . Vaginal bleeding in pregnancy, third trimester 01/23/2017  . Migraine 01/08/2017  . Abnormal glucose tolerance in pregnancy 12/25/2016  . Pelvic pain in pregnancy, antepartum, third trimester 12/21/2016  . Headache in pregnancy 11/27/2016  . H/O preterm delivery, currently pregnant, third trimester 09/01/2016  . Late prenatal care affecting pregnancy in second trimester 09/01/2016  . Obesity (BMI 30-39.9) 08/30/2016  . Maternal varicella, non-immune 08/30/2016    Past Surgical History:  Procedure Laterality Date  . CHOLECYSTECTOMY      Prior to Admission medications   Medication Sig Start Date End Date Taking? Authorizing Provider  ketorolac (TORADOL) 10 MG tablet Take 1 tablet (10 mg total) by mouth every 6 (six) hours as needed. 07/21/20   Enid Derry, PA-C    Allergies Patient has no known allergies.  Family History  Problem Relation Age of Onset  . Diabetes Father   . Hypertension Father   . Cancer  Maternal Aunt   . Gestational diabetes Sister     Social History Social History   Tobacco Use  . Smoking status: Never Smoker  . Smokeless tobacco: Never Used  Substance Use Topics  . Alcohol use: No  . Drug use: No     Review of Systems  Constitutional: No fever/chills Cardiovascular: No chest pain. Respiratory: No cough. No SOB. Gastrointestinal: Positive for left lower quadrant pain.  No nausea, no vomiting.  Genitourinary: Negative for dysuria. Musculoskeletal: Negative for musculoskeletal pain. Skin: Negative for rash, abrasions, lacerations, ecchymosis. Neurological: Negative for headaches   ____________________________________________   PHYSICAL EXAM:  VITAL SIGNS: ED Triage Vitals  Enc Vitals Group     BP 07/21/20 1143 125/75     Pulse Rate 07/21/20 1143 71     Resp 07/21/20 1143 18     Temp 07/21/20 1143 98.3 F (36.8 C)     Temp Source 07/21/20 1143 Oral     SpO2 07/21/20 1143 98 %     Weight 07/21/20 1142 189 lb (85.7 kg)     Height 07/21/20 1142 5' (1.524 m)     Head Circumference --      Peak Flow --      Pain Score 07/21/20 1142 10     Pain Loc --      Pain Edu? --      Excl. in GC? --      Constitutional: Alert and oriented. Well appearing and in no acute distress. Eyes: Conjunctivae are normal. PERRL. EOMI. Head: Atraumatic.  ENT:      Ears:      Nose: No congestion/rhinnorhea.      Mouth/Throat: Mucous membranes are moist.  Neck: No stridor.   Cardiovascular: Normal rate, regular rhythm.  Good peripheral circulation. Respiratory: Normal respiratory effort without tachypnea or retractions. Lungs CTAB. Good air entry to the bases with no decreased or absent breath sounds. Gastrointestinal: Bowel sounds 4 quadrants.  Mild left lower quadrant tenderness. No guarding or rigidity. No palpable masses. No distention.  Musculoskeletal: Full range of motion to all extremities. No gross deformities appreciated. Neurologic:  Normal speech and  language. No gross focal neurologic deficits are appreciated.  Skin:  Skin is warm, dry and intact. No rash noted. Psychiatric: Mood and affect are normal. Speech and behavior are normal. Patient exhibits appropriate insight and judgement.   ____________________________________________   LABS (all labs ordered are listed, but only abnormal results are displayed)  Labs Reviewed  COMPREHENSIVE METABOLIC PANEL - Abnormal; Notable for the following components:      Result Value   Glucose, Bld 100 (*)    All other components within normal limits  URINALYSIS, COMPLETE (UACMP) WITH MICROSCOPIC - Abnormal; Notable for the following components:   Color, Urine YELLOW (*)    APPearance HAZY (*)    All other components within normal limits  LIPASE, BLOOD  CBC  POC URINE PREG, ED  POCT PREGNANCY, URINE   ____________________________________________  EKG   ____________________________________________  RADIOLOGY   US PELVIC COMPLETE W TRANSVAGINAL AND TORSION R/O  Result Date: 07/21/2020 CLINICAL DATA:  Pelvic pain. EXAM: TRANSABDOMINAL AND TRANSVAGINAL ULTRASOUND OF PELVIS DOPPLER ULTRASOUND OF OVARIES TECHNIQUE: Both transabdominal and transvaginal ultrasound examinations of the pelvis were performed. Transabdominal technique was performed for global imaging of the pelvis including uterus, ovaries, adnexal regions, and pelvic cul-de-sac. It was necessary to proceed with endovaginal exam following the transabdominal exam to visualize the endometrium and ovaries. Color and duplex Doppler ultrasound was utilized to evaluate blood flow to the ovaries. COMPARISON:  April 03, 2020. FINDINGS: Uterus Measurements: 10.1 x 5.4 x 4.6 cm = volume: 132 mL. No fibroids or other mass visualized. Endometrium Thickness: 10 mm which is within normal limits. No focal abnormality visualized. Right ovary Measurements: 2.6 x 2.0 x 1.7 cm = volume: 4 mL. Normal appearance/no adnexal mass. Left ovary Measurements: 8.3  x 5.6 x 5.7 cm = volume: 140 mL. 8.3 x 5.7 x 5.6 cm predominantly cystic abnormality is noted in the left ovary with internal echoes suggesting hemorrhagic cyst. Pulsed Doppler evaluation of both ovaries demonstrates normal low-resistance arterial and venous waveforms. Other findings No abnormal free fluid. IMPRESSION: 1. 8.3 x 5.7 x 5.6 cm predominantly cystic abnormality is noted in the left ovary with internal echoes suggesting probable hemorrhagic cyst. Short-interval follow up ultrasound in 6-12 weeks is recommended, preferably during the week following the patient's normal menses. 2. No Doppler evidence of ovarian torsion is noted. Electronically Signed   By: Lupita Raider M.D.   On: 07/21/2020 15:33    ____________________________________________    PROCEDURES  Procedure(s) performed:    Procedures    Medications  ketorolac (TORADOL) 30 MG/ML injection 30 mg (30 mg Intramuscular Given 07/21/20 1542)     ____________________________________________   INITIAL IMPRESSION / ASSESSMENT AND PLAN / ED COURSE  Pertinent labs & imaging results that were available during my care of the patient were reviewed by me and considered in my medical decision making (see chart for details).  Review of the [place]  CSRS was performed in accordance of the [employer] prior to dispensing any controlled drugs.   Patient's diagnosis is consistent with ovarian cyst.  Vital signs and exam are reassuring. Ultrasound is consistent with a large complex left ovarian cyst, possibly a hemorrhagic cyst.  No indication of torsion.   Patient was given IM Toradol for pain.  Pain fully resolved following the Toradol.  Patient will be discharged home with prescriptions for Toradol. Patient is to follow up with obstetrics as directed. Patient is given ED precautions to return to the ED for any worsening or new symptoms.   Mandy Romero was evaluated in Emergency Department on 07/22/2020 for the symptoms described in the  history of present illness. She was evaluated in the context of the global COVID-19 pandemic, which necessitated consideration that the patient might be at risk for infection with the SARS-CoV-2 virus that causes COVID-19. Institutional protocols and algorithms that pertain to the evaluation of patients at risk for COVID-19 are in a state of rapid change based on information released by regulatory bodies including the CDC and federal and state organizations. These policies and algorithms were followed during the patient's care in the ED.  ____________________________________________  FINAL CLINICAL IMPRESSION(S) / ED DIAGNOSES  Final diagnoses:  Pelvic pain  Left ovarian cyst      NEW MEDICATIONS STARTED DURING THIS VISIT:  ED Discharge Orders         Ordered    ketorolac (TORADOL) 10 MG tablet  Every 6 hours PRN        07/21/20 1640              This chart was dictated using voice recognition software/Dragon. Despite best efforts to proofread, errors can occur which can change the meaning. Any change was purely unintentional.    Enid Derry, PA-C 07/22/20 1459    Sharman Cheek, MD 07/24/20 [phone]

## 2020-07-21 NOTE — ED Triage Notes (Signed)
Pt c/o lower abdominal pain. 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 No fever. No NVD

## 2020-07-25 ENCOUNTER — Other Ambulatory Visit: Payer: Self-pay

## 2020-07-25 ENCOUNTER — Encounter
Admission: RE | Admit: 2020-07-25 | Discharge: 2020-07-25 | Disposition: A | Discharge status: Home / Self Care | Payer: Medicaid Other | Source: Ambulatory Visit | Attending: Obstetrics and Gynecology | Admitting: Obstetrics and Gynecology

## 2020-07-25 NOTE — Patient Instructions (Addendum)
Your procedure is scheduled on: Monday July 30, 2020. Report to Day Surgery inside [facility] 2nd floor. To find out your arrival time please call [phone] between 1PM - 3PM on Friday July 27, 2020.  Remember: Instructions that are not followed completely may result in serious medical risk,  up to and including death, or upon the discretion of your surgeon and anesthesiologist your  surgery may need to be rescheduled.     _X__ 1. Do not eat food after midnight the night before your procedure.                 No chewing gum or hard candies. You may drink clear liquids up to 2 hours                 before you are scheduled to arrive for your surgery- DO not drink clear                 liquids within 2 hours of the start of your surgery.                 Clear Liquids include:  water, apple juice without pulp, clear Gatorade, G2 or                  Gatorade Zero (avoid Red/Purple/Blue), Black Coffee or Tea (Do not add                 anything to coffee or tea).  __X__2.   Complete the "Ensure Clear Pre-surgery Clear Carbohydrate Drink" provided to you, 2 hours before arrival.  __X__3.  On the morning of surgery brush your teeth with toothpaste and water, you                may rinse your mouth with mouthwash if you wish.  Do not swallow any toothpaste of mouthwash.     _X__ 4.  No Alcohol for 24 hours before or after surgery.   _X__ 5.  Do Not Smoke or use e-cigarettes For 24 Hours Prior to Your Surgery.                 Do not use any chewable tobacco products for at least 6 hours prior to                 Surgery.  _X__  6.  Do not use any recreational drugs (marijuana, cocaine, heroin, ecstasy, MDMA or other)                For at least one week prior to your surgery.  Combination of these drugs with anesthesia                May have life threatening results.  __x__ 7.  Notify your doctor if there is any change in your medical condition      (cold,  fever, infections).     Do not wear jewelry, make-up, hairpins, clips or nail polish. Do not wear lotions, powders, or perfumes. You may wear deodorant. Do not shave 48 hours prior to surgery. Men may shave face and neck. Do not bring valuables to the hospital.    [facility] is not responsible for any belongings or valuables.  Contacts, dentures or bridgework may not be worn into surgery. Leave your suitcase in the car. After surgery it may be brought to your room. For patients admitted to the hospital, discharge time is determined by your treatment team.   Patients discharged  the day of surgery will not be allowed to drive home.   Make arrangements for someone to be with you for the first 24 hours of your Same Day Discharge.   __x__ Take these medicines the morning of surgery with A SIP OF WATER:    1. None   __x__ Use CHG Soap (or wipes) as directed  __x__ Stop Anti-inflammatories such as ketorolac (TORADOL), ibuprofen (ADVIL), Aleve, naproxen, aspirin and or BC powders.    __x__ Stop supplements until after surgery.    __x__ Do not start any herbal supplements before your procedure.    If you have any questions regarding your pre-procedure instructions,  Please call Pre-admit Testing at [phone].

## 2020-07-26 ENCOUNTER — Other Ambulatory Visit
Admission: RE | Admit: 2020-07-26 | Discharge: 2020-07-26 | Disposition: A | Discharge status: Home / Self Care | Payer: Medicaid Other | Source: Ambulatory Visit | Attending: Obstetrics and Gynecology | Admitting: Obstetrics and Gynecology

## 2020-07-26 ENCOUNTER — Telehealth: Payer: Self-pay

## 2020-07-26 DIAGNOSIS — Z20822 Contact with and (suspected) exposure to covid-19: Secondary | ICD-10-CM | POA: Insufficient documentation

## 2020-07-26 DIAGNOSIS — Z01818 Encounter for other preprocedural examination: Secondary | ICD-10-CM | POA: Insufficient documentation

## 2020-07-26 LAB — CBC
HCT: 40 % (ref 36.0–46.0)
Hemoglobin: 13.7 g/dL (ref 12.0–15.0)
MCH: 29.1 pg (ref 26.0–34.0)
MCHC: 34.3 g/dL (ref 30.0–36.0)
MCV: 84.9 fL (ref 80.0–100.0)
Platelets: 280 10*3/uL (ref 150–400)
RBC: 4.71 MIL/uL (ref 3.87–5.11)
RDW: 12.5 % (ref 11.5–15.5)
WBC: 6.7 10*3/uL (ref 4.0–10.5)
nRBC: 0 % (ref 0.0–0.2)

## 2020-07-26 LAB — TYPE AND SCREEN
ABO/RH(D): O POS
Antibody Screen: NEGATIVE

## 2020-07-26 LAB — SARS CORONAVIRUS 2 (TAT 6-24 HRS): SARS Coronavirus 2: NEGATIVE

## 2020-07-26 NOTE — Telephone Encounter (Signed)
Please advise 

## 2020-07-26 NOTE — Telephone Encounter (Signed)
Pt is requesting pain medication.   Tylenol and ibup but not helping.   [facility] in [place]  Pls advise.

## 2020-07-29 MED ORDER — FAMOTIDINE 20 MG PO TABS
20.0000 mg | ORAL_TABLET | Freq: Once | ORAL | Status: AC
  Administered 2020-07-30: 20 mg via ORAL

## 2020-07-29 MED ORDER — CHLORHEXIDINE GLUCONATE 0.12 % MT SOLN
15.0000 mL | Freq: Once | OROMUCOSAL | Status: AC
  Administered 2020-07-30: 15 mL via OROMUCOSAL

## 2020-07-29 MED ORDER — POVIDONE-IODINE 10 % EX SWAB: 2.0000 "application " | Freq: Once | CUTANEOUS | Status: DC

## 2020-07-29 MED ORDER — LACTATED RINGERS IV SOLN: INTRAVENOUS | Status: DC

## 2020-07-29 MED ORDER — ORAL CARE MOUTH RINSE: 15.0000 mL | Freq: Once | OROMUCOSAL | Status: AC

## 2020-07-30 ENCOUNTER — Ambulatory Visit: Payer: Medicaid Other | Admitting: Anesthesiology

## 2020-07-30 ENCOUNTER — Ambulatory Visit
Admission: RE | Admit: 2020-07-30 | Discharge: 2020-07-30 | Disposition: A | Discharge status: Home / Self Care | Payer: Medicaid Other | Attending: Obstetrics and Gynecology | Admitting: Obstetrics and Gynecology

## 2020-07-30 ENCOUNTER — Other Ambulatory Visit: Payer: Self-pay

## 2020-07-30 ENCOUNTER — Encounter
Admission: RE | Disposition: A | Discharge status: Home / Self Care | Payer: Self-pay | Source: Home / Self Care | Attending: Obstetrics and Gynecology

## 2020-07-30 ENCOUNTER — Encounter: Payer: Self-pay | Admitting: Obstetrics and Gynecology

## 2020-07-30 DIAGNOSIS — N736 Female pelvic peritoneal adhesions (postinfective): Secondary | ICD-10-CM | POA: Diagnosis not present

## 2020-07-30 DIAGNOSIS — N83202 Unspecified ovarian cyst, left side: Secondary | ICD-10-CM

## 2020-07-30 DIAGNOSIS — N83209 Unspecified ovarian cyst, unspecified side: Secondary | ICD-10-CM

## 2020-07-30 DIAGNOSIS — R102 Pelvic and perineal pain unspecified side: Secondary | ICD-10-CM

## 2020-07-30 HISTORY — PX: LAPAROSCOPIC OVARIAN CYSTECTOMY: SHX6248

## 2020-07-30 HISTORY — PX: LAPAROSCOPIC LYSIS OF ADHESIONS: SHX5905

## 2020-07-30 LAB — POCT PREGNANCY, URINE: Preg Test, Ur: NEGATIVE

## 2020-07-30 SURGERY — EXCISION, CYST, OVARY, LAPAROSCOPIC
Anesthesia: General

## 2020-07-30 MED ORDER — SEVOFLURANE IN SOLN
RESPIRATORY_TRACT | Status: AC
  Filled 2020-07-30: qty 250

## 2020-07-30 MED ORDER — CHLORHEXIDINE GLUCONATE 0.12 % MT SOLN
OROMUCOSAL | Status: AC
  Filled 2020-07-30: qty 15

## 2020-07-30 MED ORDER — FENTANYL CITRATE (PF) 100 MCG/2ML IJ SOLN
INTRAMUSCULAR | Status: DC | PRN
Start: 2020-07-30 — End: 2020-07-30
  Administered 2020-07-30: 100 ug via INTRAVENOUS
  Administered 2020-07-30: 50 ug via INTRAVENOUS
  Administered 2020-07-30: 100 ug via INTRAVENOUS

## 2020-07-30 MED ORDER — ONDANSETRON HCL 4 MG/2ML IJ SOLN: 4.0000 mg | Freq: Once | INTRAMUSCULAR | Status: DC | PRN

## 2020-07-30 MED ORDER — ACETAMINOPHEN 10 MG/ML IV SOLN
INTRAVENOUS | Status: AC
  Filled 2020-07-30: qty 100

## 2020-07-30 MED ORDER — FENTANYL CITRATE (PF) 250 MCG/5ML IJ SOLN
INTRAMUSCULAR | Status: AC
  Filled 2020-07-30: qty 5

## 2020-07-30 MED ORDER — FAMOTIDINE 20 MG PO TABS
ORAL_TABLET | ORAL | Status: AC
  Filled 2020-07-30: qty 1

## 2020-07-30 MED ORDER — FENTANYL CITRATE (PF) 100 MCG/2ML IJ SOLN
INTRAMUSCULAR | Status: AC
  Filled 2020-07-30: qty 2

## 2020-07-30 MED ORDER — KETOROLAC TROMETHAMINE 30 MG/ML IJ SOLN
INTRAMUSCULAR | Status: AC
  Filled 2020-07-30: qty 1

## 2020-07-30 MED ORDER — BUPIVACAINE HCL 0.5 % IJ SOLN
INTRAMUSCULAR | Status: DC | PRN
  Administered 2020-07-30: 10 mL

## 2020-07-30 MED ORDER — KETOROLAC TROMETHAMINE 30 MG/ML IJ SOLN
INTRAMUSCULAR | Status: DC | PRN
  Administered 2020-07-30: 30 mg via INTRAVENOUS

## 2020-07-30 MED ORDER — ACETAMINOPHEN 10 MG/ML IV SOLN
INTRAVENOUS | Status: DC | PRN
  Administered 2020-07-30: 1000 mg via INTRAVENOUS

## 2020-07-30 MED ORDER — ONDANSETRON HCL 4 MG/2ML IJ SOLN
INTRAMUSCULAR | Status: AC
  Filled 2020-07-30: qty 2

## 2020-07-30 MED ORDER — MIDAZOLAM HCL 2 MG/2ML IJ SOLN
INTRAMUSCULAR | Status: DC | PRN
  Administered 2020-07-30: 2 mg via INTRAVENOUS

## 2020-07-30 MED ORDER — ONDANSETRON HCL 4 MG/2ML IJ SOLN
INTRAMUSCULAR | Status: DC | PRN
  Administered 2020-07-30: 4 mg via INTRAVENOUS

## 2020-07-30 MED ORDER — SUGAMMADEX SODIUM 500 MG/5ML IV SOLN
INTRAVENOUS | Status: DC | PRN
  Administered 2020-07-30: 200 mg via INTRAVENOUS

## 2020-07-30 MED ORDER — LIDOCAINE HCL (PF) 2 % IJ SOLN
INTRAMUSCULAR | Status: AC
  Filled 2020-07-30: qty 10

## 2020-07-30 MED ORDER — OXYCODONE-ACETAMINOPHEN 7.5-325 MG PO TABS
ORAL_TABLET | ORAL | Status: AC
  Administered 2020-07-30: 1
  Filled 2020-07-30: qty 1

## 2020-07-30 MED ORDER — FENTANYL CITRATE (PF) 100 MCG/2ML IJ SOLN
25.0000 ug | INTRAMUSCULAR | Status: DC | PRN
  Administered 2020-07-30 (×5): 25 ug via INTRAVENOUS

## 2020-07-30 MED ORDER — ROCURONIUM BROMIDE 10 MG/ML (PF) SYRINGE
PREFILLED_SYRINGE | INTRAVENOUS | Status: AC
  Filled 2020-07-30: qty 10

## 2020-07-30 MED ORDER — PHENYLEPHRINE HCL (PRESSORS) 10 MG/ML IV SOLN
INTRAVENOUS | Status: DC | PRN
  Administered 2020-07-30 (×3): 200 ug via INTRAVENOUS
  Administered 2020-07-30: 100 ug via INTRAVENOUS
  Administered 2020-07-30 (×2): 200 ug via INTRAVENOUS
  Administered 2020-07-30: 100 ug via INTRAVENOUS

## 2020-07-30 MED ORDER — PROPOFOL 10 MG/ML IV BOLUS
INTRAVENOUS | Status: DC | PRN
  Administered 2020-07-30: 200 mg via INTRAVENOUS

## 2020-07-30 MED ORDER — EPHEDRINE SULFATE 50 MG/ML IJ SOLN
INTRAMUSCULAR | Status: DC | PRN
  Administered 2020-07-30: 20 mg via INTRAVENOUS
  Administered 2020-07-30: 5 mg via INTRAVENOUS

## 2020-07-30 MED ORDER — DEXAMETHASONE SODIUM PHOSPHATE 10 MG/ML IJ SOLN
INTRAMUSCULAR | Status: AC
  Filled 2020-07-30: qty 1

## 2020-07-30 MED ORDER — BUPIVACAINE HCL (PF) 0.5 % IJ SOLN
INTRAMUSCULAR | Status: AC
  Filled 2020-07-30: qty 30

## 2020-07-30 MED ORDER — OXYCODONE-ACETAMINOPHEN 5-325 MG PO TABS: 1.0000 | ORAL_TABLET | ORAL | 0 refills | Status: DC | PRN

## 2020-07-30 MED ORDER — DEXAMETHASONE SODIUM PHOSPHATE 10 MG/ML IJ SOLN
INTRAMUSCULAR | Status: DC | PRN
  Administered 2020-07-30: 10 mg via INTRAVENOUS

## 2020-07-30 MED ORDER — PROPOFOL 10 MG/ML IV BOLUS
INTRAVENOUS | Status: AC
  Filled 2020-07-30: qty 20

## 2020-07-30 MED ORDER — ROCURONIUM BROMIDE 100 MG/10ML IV SOLN
INTRAVENOUS | Status: DC | PRN
  Administered 2020-07-30: 80 mg via INTRAVENOUS

## 2020-07-30 MED ORDER — MIDAZOLAM HCL 2 MG/2ML IJ SOLN
INTRAMUSCULAR | Status: AC
  Filled 2020-07-30: qty 2

## 2020-07-30 MED ORDER — LIDOCAINE HCL (CARDIAC) PF 100 MG/5ML IV SOSY
PREFILLED_SYRINGE | INTRAVENOUS | Status: DC | PRN
  Administered 2020-07-30: 80 mg via INTRAVENOUS

## 2020-07-30 MED ORDER — EPHEDRINE 5 MG/ML INJ
INTRAVENOUS | Status: AC
  Filled 2020-07-30: qty 10

## 2020-07-30 MED ORDER — HEMOSTATIC AGENTS (NO CHARGE) OPTIME
TOPICAL | Status: DC | PRN
  Administered 2020-07-30: 1 via TOPICAL

## 2020-07-30 SURGICAL SUPPLY — 45 items
APL PRP STRL LF DISP 70% ISPRP (MISCELLANEOUS) ×3
BAG DECANTER FOR FLEXI CONT (MISCELLANEOUS) ×4 IMPLANT
BARRIER ADHS 3X4 INTERCEED (GAUZE/BANDAGES/DRESSINGS) ×4 IMPLANT
BLADE SURG 15 STRL LF DISP TIS (BLADE) ×3 IMPLANT
BLADE SURG 15 STRL SS (BLADE) ×4
BLADE SURG SZ11 CARB STEEL (BLADE) ×4 IMPLANT
BRR ADH 4X3 ABS CNTRL BYND (GAUZE/BANDAGES/DRESSINGS) ×3
CANISTER SUCT 1200ML W/VALVE (MISCELLANEOUS) ×4 IMPLANT
CATH ROBINSON RED A/P 16FR (CATHETERS) ×4 IMPLANT
CHLORAPREP W/TINT 26 (MISCELLANEOUS) ×4 IMPLANT
COVER WAND RF STERILE (DRAPES) IMPLANT
ELECT REM PT RETURN 9FT ADLT (ELECTROSURGICAL) ×4
ELECTRODE REM PT RTRN 9FT ADLT (ELECTROSURGICAL) ×3 IMPLANT
GLOVE BIO SURGEON STRL SZ8 (GLOVE) ×4 IMPLANT
GLOVE BIOGEL PI ORTHO PRO 7.5 (GLOVE) ×1
GLOVE PI ORTHO PRO STRL 7.5 (GLOVE) ×3 IMPLANT
GOWN STRL REUS W/ TWL LRG LVL3 (GOWN DISPOSABLE) ×6 IMPLANT
GOWN STRL REUS W/TWL LRG LVL3 (GOWN DISPOSABLE) ×8
GOWN STRL REUS W/TWL XL LVL4 (GOWN DISPOSABLE) ×4 IMPLANT
HEMOSTAT SURGICEL 2X3 (HEMOSTASIS) ×4 IMPLANT
IRRIGATION STRYKERFLOW (MISCELLANEOUS) IMPLANT
IRRIGATOR STRYKERFLOW (MISCELLANEOUS)
IV LACTATED RINGERS 1000ML (IV SOLUTION) ×4 IMPLANT
KIT PINK PAD W/HEAD ARE REST (MISCELLANEOUS) ×4
KIT PINK PAD W/HEAD ARM REST (MISCELLANEOUS) ×3 IMPLANT
KIT TURNOVER CYSTO (KITS) ×4 IMPLANT
LIGASURE LAP MARYLAND 5MM 37CM (ELECTROSURGICAL) ×4 IMPLANT
NEEDLE HYPO 22GX1.5 SAFETY (NEEDLE) ×4 IMPLANT
NS IRRIG 500ML POUR BTL (IV SOLUTION) ×4 IMPLANT
PACK GYN LAPAROSCOPIC (MISCELLANEOUS) ×4 IMPLANT
PAD OB MATERNITY 4.3X12.25 (PERSONAL CARE ITEMS) ×4 IMPLANT
PAD PREP 24X41 OB/GYN DISP (PERSONAL CARE ITEMS) ×4 IMPLANT
SET TUBE SMOKE EVAC HIGH FLOW (TUBING) ×4 IMPLANT
SLEEVE ENDOPATH XCEL 5M (ENDOMECHANICALS) IMPLANT
STRIP CLOSURE SKIN 1/2X4 (GAUZE/BANDAGES/DRESSINGS) ×4 IMPLANT
SUT VICRYL 0 AB UR-6 (SUTURE) ×4 IMPLANT
SUT VICRYL 4-0  27 PS-2 BARIAT (SUTURE) ×1
SUT VICRYL 4-0 27 PS-2 BARIAT (SUTURE) ×3
SUTURE VICRYL 4-0 27 PS-2 BART (SUTURE) ×3 IMPLANT
TOWEL OR 17X26 4PK STRL BLUE (TOWEL DISPOSABLE) ×4 IMPLANT
TROCAR ENDO BLADELESS 11MM (ENDOMECHANICALS) IMPLANT
TROCAR ENDOPATH XCEL 12X100 BL (ENDOMECHANICALS) ×4 IMPLANT
TROCAR XCEL NON-BLD 5MMX100MML (ENDOMECHANICALS) ×4 IMPLANT
TROCAR XCEL UNIV SLVE 11M 100M (ENDOMECHANICALS) IMPLANT
TUBING CONNECTING 10 (TUBING) ×4 IMPLANT

## 2020-07-30 NOTE — Anesthesia Preprocedure Evaluation (Signed)
Anesthesia Evaluation  Patient identified by MRN, date of birth, ID band Patient awake    Reviewed: Allergy & Precautions, NPO status , Patient's Chart, lab work & pertinent test results  History of Anesthesia Complications Negative for: history of anesthetic complications  Airway Mallampati: III       Dental   Pulmonary neg sleep apnea, neg COPD,           Cardiovascular (-) hypertension(-) Past MI and (-) CHF (-) dysrhythmias (-) Valvular Problems/Murmurs     Neuro/Psych neg Seizures    GI/Hepatic Neg liver ROS, neg GERD  ,  Endo/Other  neg diabetes  Renal/GU negative Renal ROS     Musculoskeletal   Abdominal (+) + obese,   Peds  Hematology   Anesthesia Other Findings   Reproductive/Obstetrics                             Anesthesia Physical Anesthesia Plan  ASA: II  Anesthesia Plan: General   Post-op Pain Management:    Induction: Intravenous  PONV Risk Score and Plan: 3 and Ondansetron, Midazolam and Dexamethasone  Airway Management Planned: Oral ETT  Additional Equipment:   Intra-op Plan:   Post-operative Plan:   Informed Consent: I have reviewed the patients History and Physical, chart, labs and discussed the procedure including the risks, benefits and alternatives for the proposed anesthesia with the patient or authorized representative who has indicated his/her understanding and acceptance.       Plan Discussed with:   Anesthesia Plan Comments:         Anesthesia Quick Evaluation

## 2020-07-30 NOTE — Op Note (Addendum)
       OPERATIVE NOTE 07/30/2020 9:42 AM  PRE-OPERATIVE DIAGNOSIS:  1) Pelvic pain Complex ovarian cyst  POST-OPERATIVE DIAGNOSIS:  * No Diagnosis Codes entered *  OPERATION:  LAPAROSCOPIC OVARIAN CYSTECTOMY:  LAPAROSCOPIC LYSIS OF PELVIC ADHESIONS:   SURGEON(S): Surgeon(s) and Role:    Linzie Collin, MD - Primary        Cherry MD - No other capable assistant was available for this surgery which requires an experienced, high level assistant.  She provided exposure, dissection, suctioning, retraction, and general support and assistance during the procedure.  ANESTHESIA: General  ESTIMATED BLOOD LOSS: 50 mL  OPERATIVE FINDINGS: Large left ovarian cyst with adhesions to bowel and to sidewall in the cul-de-sac.  SPECIMEN: * No specimens in log *  COMPLICATIONS: None  DISPOSITION: Stable to recovery room  DESCRIPTION OF PROCEDURE:      The patient was prepped and draped in the dorsolithotomy position and placed under general anesthesia. The bladder was emptied. The cervix was grasped with a multi-toothed tenaculum and a uterine manipulator was placed within the cervical os respecting the position and curvature of the uterus. After changing gloves we proceeded abdominally. A small infraumbilical incision was made and a 5 mm trocar port was placed within the abdominopelvic cavity. The opening pressure was less than 7 mmHg.  Approximately 3 and 1/2 L of carbon dioxide gas was instilled within the abdominal pelvic cavity. The laparoscope was placed and the pelvis and abdomen were carefully inspected. It was noted that the left ovary was enlarged and adherent to the left sidewall in the cul-de-sac as well as having some bowel adhesions.  The ovary was bluntly dissected off of the bowel and the bowel was moved out of the pelvis.  We turned our attention to the large ovarian cyst and using blunt and sharp dissection were able to detach the ovary from doing left sidewall in the  cul-de-sac.  During this process a hole was made in the cyst and was noted to be significant for old blood consistent with hemorrhagic cyst.  The cyst edges were carefully inspected and the base of the cyst was carefully inspected.  Hemostasis was noted.  Marsupialization of the ovary was performed. Good ovary anteriorly and posterior almost clamshell like was noted.  Interceed was placed within the ovary and the anterior and posterior leaves were reapproximated.  We turned our attention to the sidewall where the ovary had been adherent and a small area of bleeding was noted although the exact source could not be identified.  The entire pelvis was suction irrigated and very small venous oozing was noted from that sidewall.  A piece of Surgicel was placed through the laparoscope covering that area. Hemostasis of all areas of the pelvis was noted. The ports were removed under direct visualization.  Hemostasis was noted.  The laparoscope was removed the trocar sleeve was removed and the incision was closed with a deep suture through the fascia of 0 Vicryl followed by subcuticular closure of the skin for all port sites. A long-acting anesthetic was injected.  Steri-Strips were applied. The uterine manipulator was removed. Hemostasis of the cervix was noted. The patient went to the recovery room in stable condition.  Elonda Husky, M.D. 07/30/2020 9:42 AM

## 2020-07-30 NOTE — Interval H&P Note (Signed)
History and Physical Interval Note:  07/30/2020 7:40 AM  Mandy Romero  has presented today for surgery, with the diagnosis of Pelvic pain Complex ovarian cyst.  The various methods of treatment have been discussed with the patient and family. After consideration of risks, benefits and other options for treatment, the patient has consented to  Procedure(s) with comments: OOPHORECTOMY (Left) LAPAROSCOPY DIAGNOSTIC (N/A) - Exploratory Laparoscopy as a surgical intervention.  The patient's history has been reviewed, patient examined, no change in status, stable for surgery.  I have reviewed the patient's chart and labs.  Questions were answered to the patient's satisfaction.     Brennan Bailey

## 2020-07-30 NOTE — Discharge Instructions (Signed)

## 2020-07-30 NOTE — Anesthesia Postprocedure Evaluation (Signed)
Anesthesia Post Note  Patient: Mandy Romero  Procedure(s) Performed: LAPAROSCOPIC OVARIAN CYSTECTOMY (Left ) LAPAROSCOPIC LYSIS OF PELVIC ADHESIONS  Patient location during evaluation: PACU Anesthesia Type: General Level of consciousness: awake and alert Pain management: pain level controlled Vital Signs Assessment: post-procedure vital signs reviewed and stable Respiratory status: spontaneous breathing and respiratory function stable Cardiovascular status: stable Anesthetic complications: no   No complications documented.   Last Vitals:  Vitals:   07/30/20 1020 07/30/20 1029  BP: 105/60 (!) 98/59  Pulse: (!) 102 85  Resp: 18 18  Temp:  36.6 C  SpO2: 93%     Last Pain:  Vitals:   07/30/20 1029  TempSrc: Temporal  PainSc:                  Heath Tesler K

## 2020-07-30 NOTE — Transfer of Care (Signed)
Immediate Anesthesia Transfer of Care Note  Patient: Mandy Romero  Procedure(s) Performed: LAPAROSCOPIC OVARIAN CYSTECTOMY (Left ) LAPAROSCOPIC LYSIS OF PELVIC ADHESIONS  Patient Location: PACU  Anesthesia Type:General  Level of Consciousness: awake  Airway & Oxygen Therapy: Patient Spontanous Breathing and Patient connected to face mask oxygen  Post-op Assessment: Report given to RN and Post -op Vital signs reviewed and stable  Post vital signs: Reviewed and stable  Last Vitals:  Vitals Value Taken Time  BP 112/64 07/30/20 0935  Temp    Pulse 91 07/30/20 0936  Resp    SpO2 98 % 07/30/20 0936  Vitals shown include unvalidated device data.  Last Pain:  Vitals:   07/30/20 0613  TempSrc: Tympanic  PainSc: 9          Complications: No complications documented.

## 2020-07-30 NOTE — Anesthesia Procedure Notes (Signed)
Procedure Name: Intubation Performed by: Lateia Fraser R, CRNA Pre-anesthesia Checklist: Patient identified, Emergency Drugs available, Suction available and Patient being monitored Patient Re-evaluated:Patient Re-evaluated prior to induction Oxygen Delivery Method: Circle system utilized Preoxygenation: Pre-oxygenation with 100% oxygen Induction Type: IV induction Ventilation: Mask ventilation without difficulty and Oral airway inserted - appropriate to patient size Laryngoscope Size: Mac and 3 Grade View: Grade I Tube type: Oral Tube size: 7.0 mm Number of attempts: 1 Airway Equipment and Method: Oral airway Placement Confirmation: ETT inserted through vocal cords under direct vision,  positive ETCO2 and breath sounds checked- equal and bilateral Secured at: 21 cm Tube secured with: Tape Dental Injury: Teeth and Oropharynx as per pre-operative assessment        

## 2020-07-30 NOTE — H&P (Signed)
PRE-OPERATIVE HISTORY AND PHYSICAL EXAM  PCP:  Patient, No Pcp Per Subjective:   HPI:  Mandy Romero is a 24 y.o. [phone].  Patient's last menstrual period was 06/09/2020 (approximate).  She presents today for a pre-op discussion and PE.  She has the following symptoms: Patient has pelvic pain which is worsening.  It is especially bad with her menstrual periods.  An ultrasound reveals a complex left ovarian cyst.  Roma score low for possibility of malignancy.  Review of Systems:   Constitutional: Denied constitutional symptoms, night sweats, recent illness, fatigue, fever, insomnia and weight loss.  Eyes: Denied eye symptoms, eye pain, photophobia, vision change and visual disturbance.  Ears/Nose/Throat/Neck: Denied ear, nose, throat or neck symptoms, hearing loss, nasal discharge, sinus congestion and sore throat.  Cardiovascular: Denied cardiovascular symptoms, arrhythmia, chest pain/pressure, edema, exercise intolerance, orthopnea and palpitations.  Respiratory: Denied pulmonary symptoms, asthma, pleuritic pain, productive sputum, cough, dyspnea and wheezing.  Gastrointestinal: Denied, gastro-esophageal reflux, melena, nausea and vomiting.  Genitourinary: See HPI for additional information.  Musculoskeletal: Denied musculoskeletal symptoms, stiffness, swelling, muscle weakness and myalgia.  Dermatologic: Denied dermatology symptoms, rash and scar.  Neurologic: Denied neurology symptoms, dizziness, headache, neck pain and syncope.  Psychiatric: Denied psychiatric symptoms, anxiety and depression.  Endocrine: Denied endocrine symptoms including hot flashes and night sweats.   OB History  Gravida Para Term Preterm AB Living  3 3 1 2   3   SAB TAB Ectopic Multiple Live Births           0 3       # Outcome Date GA Lbr Len/2nd Weight Sex Delivery Anes PTL Lv  3 Term 01/28/17 [redacted]w[redacted]d  6 lb 15.5 oz (3.16 kg) M Vag-Spont Other  LIV   2 Preterm 2016 [redacted]w[redacted]d  6 lb 12 oz (3.062 kg) M Vag-Spont None  LIV  1 Preterm 2013 [redacted]w[redacted]d  6 lb 11 oz (3.033 kg) M Vag-Spont None  LIV        Past Medical History:  Diagnosis Date  . Migraine   . Peritonsillar abscess   . Preterm labor          Past Surgical History:  Procedure Laterality Date  . CHOLECYSTECTOMY        SOCIAL HISTORY:  Social History      Tobacco Use  Smoking Status Never Smoker  Smokeless Tobacco Never Used   Social History      Substance and Sexual Activity  Alcohol Use No    Social History      Substance and Sexual Activity  Drug Use No         Family History  Problem Relation Age of Onset  . Diabetes Father   . Hypertension Father   . Cancer Maternal Aunt   . Gestational diabetes Sister     ALLERGIES:  Patient has no known allergies.  MEDS:                    Current Outpatient Medications on File Prior to Visit  Medication Sig Dispense Refill  . ibuprofen (ADVIL) 800 MG tablet Take 1 tablet (800 mg total) by mouth every 8 (eight) hours as needed. (Patient not taking: Reported on 06/27/2020) 30 tablet 0  No current facility-administered medications on file prior to visit.    No orders of the defined types were placed in this encounter.    Physical examination BP 96/61   Pulse 69   Ht 5' (1.524 m)   Wt 190 lb 1 oz (86.2 kg)   LMP 06/09/2020 (Approximate)   BMI 37.12 kg/m   General NAD, Conversant  HEENT Atraumatic; Op clear with mmm.  Normo-cephalic. Pupils reactive. Anicteric sclerae  Thyroid/Neck Smooth without nodularity or enlargement. Normal ROM.  Neck Supple.  Skin No rashes, lesions or ulceration. Normal palpated skin turgor. No nodularity.  Breasts: No masses or discharge.  Symmetric.  No axillary adenopathy.  Lungs: Clear to auscultation.No rales or wheezes. Normal Respiratory effort, no retractions.  Heart: NSR.  No murmurs or rubs appreciated. No periferal edema  Abdomen:  Soft.  Non-tender.  No masses.  No HSM. No hernia  Extremities: Moves all appropriately.  Normal ROM for age. No lymphadenopathy.  Neuro: Oriented to PPT.  Normal mood. Normal affect.   Pelvic :  Most recent US:  (07-21-20)   IMPRESSION: 1. 8.3 x 5.7 x 5.6 cm predominantly cystic abnormality is noted in the left ovary with internal echoes suggesting probable hemorrhagic cyst. Short-interval follow up ultrasound in 6-12 weeks is recommended, preferably during the week following the patient's normal menses. 2. No Doppler evidence of ovarian torsion is noted.   Impression: 1. Complex lesion in the left ovary that has been described above and appears larger at this time.  Assessment:   [id number]     Patient Active Problem List   Diagnosis Date Noted  . Vaginal bleeding in pregnancy, third trimester 01/23/2017  . Migraine 01/08/2017  . Abnormal glucose tolerance in pregnancy 12/25/2016  . Pelvic pain in pregnancy, antepartum, third trimester 12/21/2016  . Headache in pregnancy 11/27/2016  . H/O preterm delivery, currently pregnant, third trimester 09/01/2016  . Late prenatal care affecting pregnancy in second trimester 09/01/2016  . Obesity (BMI 30-39.9) 08/30/2016  . Maternal varicella, non-immune 08/30/2016    1. Preop examination   2. Complex ovarian cyst   3. Pelvic pain in female      Plan:   Orders: No orders of the defined types were placed in this encounter.    1.  Exploratory laparoscopy-left oophorectomy via laparoscopy.

## 2020-07-30 NOTE — Progress Notes (Signed)
Pt able to tolerate PO fluids and crackers.  NAD.  Denies nausea.

## 2020-07-31 ENCOUNTER — Encounter: Payer: Self-pay | Admitting: Obstetrics and Gynecology

## 2020-08-07 ENCOUNTER — Ambulatory Visit (INDEPENDENT_AMBULATORY_CARE_PROVIDER_SITE_OTHER): Payer: Medicaid Other | Admitting: Obstetrics and Gynecology

## 2020-08-07 ENCOUNTER — Encounter: Payer: Self-pay | Admitting: Obstetrics and Gynecology

## 2020-08-07 ENCOUNTER — Other Ambulatory Visit: Payer: Self-pay

## 2020-08-07 VITALS — BP 115/62 | HR 84 | Wt 190.4 lb

## 2020-08-07 DIAGNOSIS — R102 Pelvic and perineal pain: Secondary | ICD-10-CM

## 2020-08-07 DIAGNOSIS — N83299 Other ovarian cyst, unspecified side: Secondary | ICD-10-CM

## 2020-08-07 DIAGNOSIS — Z9889 Other specified postprocedural states: Secondary | ICD-10-CM

## 2020-08-07 NOTE — Progress Notes (Signed)
HPI:      [name] is a 24 y.o. [phone] who LMP was Patient's last menstrual period was 07/08/2020.  Subjective:   She presents today partially 10 days from ovarian cystectomy.  She says she is doing well.  Not taking any narcotics.  Pain is minimal.  Has resumed normal activities with exception of heavy lifting intercourse and driving.  Ambulating voiding eating without difficulty.    Hx: The following portions of the patient's history were reviewed and updated as appropriate:             She  has a past medical history of Migraine, Peritonsillar abscess, and Preterm labor. She does not have any pertinent problems on file. She  has a past surgical history that includes Cholecystectomy; Laparoscopic ovarian cystectomy (Left, 07/30/2020); and Laparoscopic lysis of adhesions (07/30/2020). Her family history includes Cancer in her maternal aunt; Diabetes in her father; Gestational diabetes in her sister; Hypertension in her father. She  reports that she has never smoked. She has never used smokeless tobacco. She reports current alcohol use. She reports that she does not use drugs. She has a current medication list which includes the following prescription(s): acetaminophen, ibuprofen, and oxycodone-acetaminophen. She has No Known Allergies.       Review of Systems:  Review of Systems  Constitutional: Denied constitutional symptoms, night sweats, recent illness, fatigue, fever, insomnia and weight loss.  Eyes: Denied eye symptoms, eye pain, photophobia, vision change and visual disturbance.  Ears/Nose/Throat/Neck: Denied ear, nose, throat or neck symptoms, hearing loss, nasal discharge, sinus congestion and sore throat.  Cardiovascular: Denied cardiovascular symptoms, arrhythmia, chest pain/pressure, edema, exercise intolerance, orthopnea and palpitations.  Respiratory: Denied pulmonary symptoms, asthma, pleuritic pain, productive sputum, cough, dyspnea and wheezing.   Gastrointestinal: Denied, gastro-esophageal reflux, melena, nausea and vomiting.  Genitourinary: Denied genitourinary symptoms including symptomatic vaginal discharge, pelvic relaxation issues, and urinary complaints.  Musculoskeletal: Denied musculoskeletal symptoms, stiffness, swelling, muscle weakness and myalgia.  Dermatologic: Denied dermatology symptoms, rash and scar.  Neurologic: Denied neurology symptoms, dizziness, headache, neck pain and syncope.  Psychiatric: Denied psychiatric symptoms, anxiety and depression.  Endocrine: Denied endocrine symptoms including hot flashes and night sweats.   Meds:   Current Outpatient Medications on File Prior to Visit  Medication Sig Dispense Refill  . ibuprofen (ADVIL) 200 MG tablet Take 200 mg by mouth every 6 (six) hours as needed.    Marland Kitchen oxyCODONE-acetaminophen (PERCOCET) 5-325 MG tablet Take 1 tablet by mouth every 4 (four) hours as needed for severe pain. 20 tablet 0   No current facility-administered medications on file prior to visit.          Objective:     Vitals:   08/07/20 0858  BP: 115/62  Pulse: 84   Filed Weights   08/07/20 0858  Weight: 190 lb 6.4 oz (86.4 kg)               Abdomen: Soft.  Non-tender.  No masses.  No HSM.  Incision/s: Intact.  Healing well.  No erythema.  No drainage.      Assessment:    [id number] Patient Active Problem List   Diagnosis Date Noted  . Ovarian cyst 07/30/2020  . Pelvic adhesions 07/30/2020  . Pelvic pain in female 07/30/2020  . Vaginal bleeding in pregnancy, third trimester 01/23/2017  . Migraine 01/08/2017  . Abnormal glucose tolerance in pregnancy 12/25/2016  . Pelvic pain in pregnancy, antepartum, third trimester 12/21/2016  . Headache in pregnancy 11/27/2016  .  H/O preterm delivery, currently pregnant, third trimester 09/01/2016  . Late prenatal care affecting pregnancy in second trimester 09/01/2016  . Obesity (BMI 30-39.9) 08/30/2016  . Maternal varicella,  non-immune 08/30/2016     1. Post-operative state   2. Complex ovarian cyst   3. Pelvic pain in female     Patient doing very well postop.   Plan:            1.  May resume normal activities.  2.  Patient inquiring about weight loss.    Weight loss discussed.  Counting calories.  Weight watchers. Orders No orders of the defined types were placed in this encounter.   No orders of the defined types were placed in this encounter.     F/U  Return in about 6 weeks (around 09/18/2020).  Elonda Husky, M.D. 08/07/2020 9:02 AM

## 2020-08-14 ENCOUNTER — Encounter: Payer: Medicaid Other | Admitting: Obstetrics and Gynecology

## 2020-09-18 ENCOUNTER — Encounter: Payer: Medicaid Other | Admitting: Obstetrics and Gynecology

## 2020-09-20 ENCOUNTER — Ambulatory Visit (INDEPENDENT_AMBULATORY_CARE_PROVIDER_SITE_OTHER): Payer: Medicaid Other | Admitting: Obstetrics and Gynecology

## 2020-09-20 ENCOUNTER — Other Ambulatory Visit: Payer: Self-pay

## 2020-09-20 ENCOUNTER — Encounter: Payer: Self-pay | Admitting: Obstetrics and Gynecology

## 2020-09-20 VITALS — BP 130/79 | HR 105 | Ht 60.0 in | Wt 189.2 lb

## 2020-09-20 DIAGNOSIS — Z30011 Encounter for initial prescription of contraceptive pills: Secondary | ICD-10-CM | POA: Diagnosis not present

## 2020-09-20 DIAGNOSIS — Z9889 Other specified postprocedural states: Secondary | ICD-10-CM

## 2020-09-20 DIAGNOSIS — Z3009 Encounter for other general counseling and advice on contraception: Secondary | ICD-10-CM | POA: Diagnosis not present

## 2020-09-20 MED ORDER — LO LOESTRIN FE 1 MG-10 MCG / 10 MCG PO TABS: 1.0000 | ORAL_TABLET | Freq: Every day | ORAL | 2 refills | Status: DC

## 2020-09-20 NOTE — Progress Notes (Signed)
HPI:      [name] is a 24 y.o. [phone] who LMP was No LMP recorded.  Subjective:   She presents today for a postop visit from a hemorrhagic ovarian cyst.  She reports that she is doing very well and has no problems.  She has resumed normal activities.  She is not taking anything for pain relief at this time.  She has had a menstrual period without problem. Patient requests a discussion of birth control.  She is currently using condoms and would like to consider OCPs.    Hx: The following portions of the patient's history were reviewed and updated as appropriate:             She  has a past medical history of Migraine, Peritonsillar abscess, and Preterm labor. She does not have any pertinent problems on file. She  has a past surgical history that includes Cholecystectomy; Laparoscopic ovarian cystectomy (Left, 07/30/2020); and Laparoscopic lysis of adhesions (07/30/2020). Her family history includes Cancer in her maternal aunt; Diabetes in her father; Gestational diabetes in her sister; Hypertension in her father. She  reports that she has never smoked. She has never used smokeless tobacco. She reports current alcohol use. She reports that she does not use drugs. She has a current medication list which includes the following prescription(s): acetaminophen, ibuprofen, lo loestrin fe, and oxycodone-acetaminophen. She has No Known Allergies.       Review of Systems:  Review of Systems  Constitutional: Denied constitutional symptoms, night sweats, recent illness, fatigue, fever, insomnia and weight loss.  Eyes: Denied eye symptoms, eye pain, photophobia, vision change and visual disturbance.  Ears/Nose/Throat/Neck: Denied ear, nose, throat or neck symptoms, hearing loss, nasal discharge, sinus congestion and sore throat.  Cardiovascular: Denied cardiovascular symptoms, arrhythmia, chest pain/pressure, edema, exercise intolerance, orthopnea and palpitations.  Respiratory: Denied  pulmonary symptoms, asthma, pleuritic pain, productive sputum, cough, dyspnea and wheezing.  Gastrointestinal: Denied, gastro-esophageal reflux, melena, nausea and vomiting.  Genitourinary: Denied genitourinary symptoms including symptomatic vaginal discharge, pelvic relaxation issues, and urinary complaints.  Musculoskeletal: Denied musculoskeletal symptoms, stiffness, swelling, muscle weakness and myalgia.  Dermatologic: Denied dermatology symptoms, rash and scar.  Neurologic: Denied neurology symptoms, dizziness, headache, neck pain and syncope.  Psychiatric: Denied psychiatric symptoms, anxiety and depression.  Endocrine: Denied endocrine symptoms including hot flashes and night sweats.   Meds:   Current Outpatient Medications on File Prior to Visit  Medication Sig Dispense Refill  . acetaminophen (TYLENOL) 325 MG tablet Take 650 mg by mouth every 6 (six) hours as needed. (Patient not taking: Reported on 09/20/2020)    . ibuprofen (ADVIL) 200 MG tablet Take 200 mg by mouth every 6 (six) hours as needed. (Patient not taking: Reported on 09/20/2020)    . oxyCODONE-acetaminophen (PERCOCET) 5-325 MG tablet Take 1 tablet by mouth every 4 (four) hours as needed for severe pain. (Patient not taking: Reported on 09/20/2020) 20 tablet 0   No current facility-administered medications on file prior to visit.       The pregnancy intention screening data noted above was reviewed. Potential methods of contraception were discussed. The patient elected to proceed with Oral Contraceptive.     Objective:     Vitals:   09/20/20 1253  BP: 130/79  Pulse: (!) 105   Filed Weights   09/20/20 1253  Weight: 189 lb 3.2 oz (85.8 kg)                Assessment:    [id number] Patient  Active Problem List   Diagnosis Date Noted  . Ovarian cyst 07/30/2020  . Pelvic adhesions 07/30/2020  . Pelvic pain in female 07/30/2020  . Vaginal bleeding in pregnancy, third trimester 01/23/2017  . Migraine 01/08/2017   . Abnormal glucose tolerance in pregnancy 12/25/2016  . Pelvic pain in pregnancy, antepartum, third trimester 12/21/2016  . Headache in pregnancy 11/27/2016  . H/O preterm delivery, currently pregnant, third trimester 09/01/2016  . Late prenatal care affecting pregnancy in second trimester 09/01/2016  . Obesity (BMI 30-39.9) 08/30/2016  . Maternal varicella, non-immune 08/30/2016     1. Post-operative state   2. Birth control counseling   3. Initiation of OCP (BCP)     Patient doing well postop without issue.  Desires OCPs for birth control.   Plan:            1.  Patient may resume normal activities  2.  Birth Control I discussed multiple birth control options and methods with the patient.  The risks and benefits of each were reviewed. OCPs The risks /benefits of OCPs have been explained to the patient in detail.  Product literature has been given to her where appropriate.  I have instructed her in the use of OCPs.  I have explained to the patient that OCPs are not as effective for birth control during the first month of use, and that another form of contraception should be used during this time.  Both first-day start and Sunday start have been explained.  The risks and benefits of each was discussed.  She has been made aware of  the fact that in rare circumstances, other medications may affect the efficacy of OCPs.  I have answered all of her questions, and I believe that she has an understanding of the effectiveness and use of OCPs.  Orders No orders of the defined types were placed in this encounter.    Meds ordered this encounter  Medications  . Norethindrone-Ethinyl Estradiol-Fe Biphas (LO LOESTRIN FE) 1 MG-10 MCG / 10 MCG tablet    Sig: Take 1 tablet by mouth at bedtime. Begin pills on 1st day of menses.    Dispense:  28 tablet    Refill:  2      F/U  No follow-ups on file. I spent 13 minutes involved in the care of this patient preparing to see the patient by  obtaining and reviewing her medical history (including labs, imaging tests and prior procedures), documenting clinical information in the electronic health record (EHR), counseling and coordinating care plans, writing and sending prescriptions, ordering tests or procedures and directly communicating with the patient by discussing pertinent items from her history and physical exam as well as detailing my assessment and plan as noted above so that she has an informed understanding.  This extra portion of time was spent discussing birth control options, the use of OCPs, the risk benefits of OCPs as described above.  All of her questions were answered.  Elonda Husky, M.D. 09/20/2020 1:11 PM

## 2020-10-27 ENCOUNTER — Other Ambulatory Visit: Payer: Medicaid Other

## 2020-12-04 ENCOUNTER — Ambulatory Visit (INDEPENDENT_AMBULATORY_CARE_PROVIDER_SITE_OTHER): Payer: Medicaid Other | Admitting: Obstetrics and Gynecology

## 2020-12-04 ENCOUNTER — Other Ambulatory Visit: Payer: Self-pay

## 2020-12-04 ENCOUNTER — Encounter: Payer: Self-pay | Admitting: Obstetrics and Gynecology

## 2020-12-04 VITALS — BP 108/71 | HR 75 | Ht 60.0 in | Wt 196.6 lb

## 2020-12-04 DIAGNOSIS — K4090 Unilateral inguinal hernia, without obstruction or gangrene, not specified as recurrent: Secondary | ICD-10-CM

## 2020-12-04 DIAGNOSIS — R102 Pelvic and perineal pain: Secondary | ICD-10-CM

## 2020-12-04 DIAGNOSIS — L83 Acanthosis nigricans: Secondary | ICD-10-CM

## 2020-12-04 NOTE — Patient Instructions (Signed)
Acanthosis Nigricans Acanthosis nigricans is a condition in which dark, velvety markings appear on the skin. What are the causes? This condition may be caused by:  A hormonal or glandular disorder, such as diabetes.  Obesity.  Certain medicines, such as birth control pills.  A tumor. This is rare. Some people inherit the condition from their parents. What increases the risk? You are more likely to develop this condition if you:  Have a hormonal or glandular disorder.  Are overweight.  Take certain medicines.  Have certain cancers, especially stomach cancer.  Have dark-colored skin (dark complexion). What are the signs or symptoms? The main symptom of this condition is velvety markings on the skin that are light brown, black, or grayish in color.  The markings usually appear on the face. They may also appear in skin fold areas at the neck, armpits, inner thighs, and groin.  In severe cases, markings may also appear on the lips, hands, breasts, eyelids, and mouth.   How is this diagnosed? This condition may be diagnosed based on your symptoms.  A skin sample may be removed for testing (skin biopsy).  You may also have tests to help determine the cause of the condition. How is this treated? Treatment for this condition depends on the cause. Treatment may involve reducing insulin levels, which are often high in people who have this condition. Insulin levels can be reduced with:  Dietary changes, such as avoiding starchy foods and sugars.  Losing weight.  Medicines. Sometimes, treatment involves:  Medicines to improve the appearance of the skin.  Laser treatment to improve the appearance of the skin.  Surgical removal of the skin markings (dermabrasion). Follow these instructions at home:  Follow diet instructions from your health care provider.  Lose weight if you are overweight.  Take over-the-counter and prescription medicines only as told by your health care  provider.  Keep all follow-up visits as told by your health care provider. This is important. Contact a health care provider if:  New skin markings develop on a part of the body where they rarely develop, such as on your lips, hands, breasts, eyelids, or mouth.  The condition recurs for an unknown reason. Summary  Acanthosis nigricans is a condition in which dark, velvety markings appear on the skin.  Treatment for this condition depends on the cause. Treatment may include dietary changes, medicines, laser treatment, or surgery.  Take over-the-counter and prescription medicines only as told by your health care provider.  Contact a health care provider if new skin markings develop on a part of the body where they rarely develop, such as on your lips, hands, breasts, eyelids, or mouth.  Keep all follow-up visits as told by your health care provider. This is important. This information is not intended to replace advice given to you by your health care provider. Make sure you discuss any questions you have with your health care provider. Document Revised: 02/15/2018 Document Reviewed: 02/15/2018 Elsevier Patient Education  2021 [employer].

## 2020-12-04 NOTE — Progress Notes (Signed)
HPI:      [name] is a 25 y.o. [phone] who LMP was Patient's last menstrual period was 11/24/2020 (exact date).  Subjective:   She presents today because she began work and lifted some heavy objects and started having pain on her left side.  It has been present for about 2 weeks.  She is concerned that her cyst has returned.  She states the pain is not worse at the end of the day.  She does state that it occurred immediately after heavy lifting. She has been having issues with constipation.  And not been having regular bowel movements. Also complains of dark thickened areas on her neck.  She would like a referral to dermatology. She is taking her OCPs and having normal regular menses.    Hx: The following portions of the patient's history were reviewed and updated as appropriate:             She  has a past medical history of Migraine, Ovarian cyst, Peritonsillar abscess, and Preterm labor. She does not have any pertinent problems on file. She  has a past surgical history that includes Cholecystectomy; Laparoscopic ovarian cystectomy (Left, 07/30/2020); and Laparoscopic lysis of adhesions (07/30/2020). Her family history includes Cancer in her maternal aunt; Diabetes in her father; Gestational diabetes in her sister; Hypertension in her father. She  reports that she has never smoked. She has never used smokeless tobacco. She reports current alcohol use. She reports that she does not use drugs. She has a current medication list which includes the following prescription(s): lo loestrin fe. She has No Known Allergies.       Review of Systems:  Review of Systems  Constitutional: Denied constitutional symptoms, night sweats, recent illness, fatigue, fever, insomnia and weight loss.  Eyes: Denied eye symptoms, eye pain, photophobia, vision change and visual disturbance.  Ears/Nose/Throat/Neck: Denied ear, nose, throat or neck symptoms, hearing loss, nasal discharge, sinus  congestion and sore throat.  Cardiovascular: Denied cardiovascular symptoms, arrhythmia, chest pain/pressure, edema, exercise intolerance, orthopnea and palpitations.  Respiratory: Denied pulmonary symptoms, asthma, pleuritic pain, productive sputum, cough, dyspnea and wheezing.  Gastrointestinal: Denied, gastro-esophageal reflux, melena, nausea and vomiting.  Genitourinary: See HPI for additional information.  Musculoskeletal: Denied musculoskeletal symptoms, stiffness, swelling, muscle weakness and myalgia.  Dermatologic: See HPI for additional information.  Neurologic: Denied neurology symptoms, dizziness, headache, neck pain and syncope.  Psychiatric: Denied psychiatric symptoms, anxiety and depression.  Endocrine: Denied endocrine symptoms including hot flashes and night sweats.   Meds:   Current Outpatient Medications on File Prior to Visit  Medication Sig Dispense Refill  . Norethindrone-Ethinyl Estradiol-Fe Biphas (LO LOESTRIN FE) 1 MG-10 MCG / 10 MCG tablet Take 1 tablet by mouth at bedtime. Begin pills on 1st day of menses. 28 tablet 2   No current facility-administered medications on file prior to visit.       The pregnancy intention screening data noted above was reviewed. Potential methods of contraception were discussed. The patient elected to proceed with Oral Contraceptive.     Objective:     Vitals:   12/04/20 1525  BP: 108/71  Pulse: 75   Filed Weights   12/04/20 1525  Weight: 196 lb 9.6 oz (89.2 kg)              Abdominal examination reveals pain in the left lower quadrant.  This is reproducible with palpation of the abdominal wall.  It is in the inguinal area and worse with Valsalva maneuver.  Examination of the neck reveals dark thickened areas along the creases.  Assessment:    [id number] Patient Active Problem List   Diagnosis Date Noted  . Ovarian cyst 07/30/2020  . Pelvic adhesions 07/30/2020  . Pelvic pain in female 07/30/2020  . Vaginal  bleeding in pregnancy, third trimester 01/23/2017  . Migraine 01/08/2017  . Abnormal glucose tolerance in pregnancy 12/25/2016  . Pelvic pain in pregnancy, antepartum, third trimester 12/21/2016  . Headache in pregnancy 11/27/2016  . H/O preterm delivery, currently pregnant, third trimester 09/01/2016  . Late prenatal care affecting pregnancy in second trimester 09/01/2016  . Obesity (BMI 30-39.9) 08/30/2016  . Maternal varicella, non-immune 08/30/2016     1. Pelvic pain in female   2. Non-recurrent unilateral inguinal hernia without obstruction or gangrene   3. Acanthosis nigricans     Likely hernia causing the pain-very small.  This is consistent with her symptoms and its origin.   Plan:            1.  Hernia discussed in detail.  2.  Acanthosis nigricans discussed literature given referral to dermatology.  Discussed role of weight loss and diabetes in control. Orders Orders Placed This Encounter  Procedures  . Ambulatory referral to Dermatology    No orders of the defined types were placed in this encounter.     F/U  Return for Annual Physical. I spent 22 minutes involved in the care of this patient preparing to see the patient by obtaining and reviewing her medical history (including labs, imaging tests and prior procedures), documenting clinical information in the electronic health record (EHR), counseling and coordinating care plans, writing and sending prescriptions, ordering tests or procedures and directly communicating with the patient by discussing pertinent items from her history and physical exam as well as detailing my assessment and plan as noted above so that she has an informed understanding.  All of her questions were answered.  Elonda Husky, M.D. 12/04/2020 3:55 PM

## 2020-12-09 ENCOUNTER — Emergency Department
Admission: EM | Admit: 2020-12-09 | Discharge: 2020-12-09 | Disposition: A | Discharge status: Home / Self Care | Payer: Medicaid Other | Attending: Student in an Organized Health Care Education/Training Program | Admitting: Student in an Organized Health Care Education/Training Program

## 2020-12-09 ENCOUNTER — Other Ambulatory Visit: Payer: Self-pay

## 2020-12-09 ENCOUNTER — Emergency Department: Payer: Medicaid Other

## 2020-12-09 DIAGNOSIS — N83202 Unspecified ovarian cyst, left side: Secondary | ICD-10-CM | POA: Insufficient documentation

## 2020-12-09 DIAGNOSIS — Y93F2 Activity, caregiving, lifting: Secondary | ICD-10-CM | POA: Insufficient documentation

## 2020-12-09 DIAGNOSIS — R109 Unspecified abdominal pain: Secondary | ICD-10-CM | POA: Diagnosis present

## 2020-12-09 DIAGNOSIS — R102 Pelvic and perineal pain: Secondary | ICD-10-CM | POA: Insufficient documentation

## 2020-12-09 DIAGNOSIS — Z79899 Other long term (current) drug therapy: Secondary | ICD-10-CM | POA: Insufficient documentation

## 2020-12-09 DIAGNOSIS — Y990 Civilian activity done for income or pay: Secondary | ICD-10-CM | POA: Insufficient documentation

## 2020-12-09 LAB — URINALYSIS, COMPLETE (UACMP) WITH MICROSCOPIC
Bacteria, UA: NONE SEEN
Bilirubin Urine: NEGATIVE
Glucose, UA: NEGATIVE mg/dL
Ketones, ur: NEGATIVE mg/dL
Nitrite: NEGATIVE
Protein, ur: NEGATIVE mg/dL
Specific Gravity, Urine: 1.02 (ref 1.005–1.030)
pH: 6 (ref 5.0–8.0)

## 2020-12-09 LAB — CBC
HCT: 41 % (ref 36.0–46.0)
Hemoglobin: 14 g/dL (ref 12.0–15.0)
MCH: 28.4 pg (ref 26.0–34.0)
MCHC: 34.1 g/dL (ref 30.0–36.0)
MCV: 83.2 fL (ref 80.0–100.0)
Platelets: 304 10*3/uL (ref 150–400)
RBC: 4.93 MIL/uL (ref 3.87–5.11)
RDW: 13.7 % (ref 11.5–15.5)
WBC: 6.1 10*3/uL (ref 4.0–10.5)
nRBC: 0 % (ref 0.0–0.2)

## 2020-12-09 LAB — COMPREHENSIVE METABOLIC PANEL
ALT: 38 U/L (ref 0–44)
AST: 28 U/L (ref 15–41)
Albumin: 4.4 g/dL (ref 3.5–5.0)
Alkaline Phosphatase: 67 U/L (ref 38–126)
Anion gap: 9 (ref 5–15)
BUN: 9 mg/dL (ref 6–20)
CO2: 23 mmol/L (ref 22–32)
Calcium: 8.9 mg/dL (ref 8.9–10.3)
Chloride: 104 mmol/L (ref 98–111)
Creatinine, Ser: 0.48 mg/dL (ref 0.44–1.00)
GFR, Estimated: >60 mL/min (ref >60)
Glucose, Bld: 102 mg/dL — ABNORMAL HIGH (ref 70–99)
Potassium: 3.8 mmol/L (ref 3.5–5.1)
Sodium: 136 mmol/L (ref 135–145)
Total Bilirubin: 0.8 mg/dL (ref 0.3–1.2)
Total Protein: 8.2 g/dL — ABNORMAL HIGH (ref 6.5–8.1)

## 2020-12-09 LAB — PREGNANCY, URINE: Preg Test, Ur: NEGATIVE

## 2020-12-09 LAB — LIPASE, BLOOD: Lipase: 31 U/L (ref 11–51)

## 2020-12-09 MED ORDER — MELOXICAM 15 MG PO TABS: 15.0000 mg | ORAL_TABLET | Freq: Every day | ORAL | 0 refills | Status: DC

## 2020-12-09 MED ORDER — HYDROCODONE-ACETAMINOPHEN 5-325 MG PO TABS
1.0000 | ORAL_TABLET | Freq: Once | ORAL | Status: AC
  Administered 2020-12-09: 1 via ORAL
  Filled 2020-12-09: qty 1

## 2020-12-09 MED ORDER — TRAMADOL HCL 50 MG PO TABS: 50.0000 mg | ORAL_TABLET | Freq: Four times a day (QID) | ORAL | 0 refills | Status: DC | PRN

## 2020-12-09 MED ORDER — MELOXICAM 7.5 MG PO TABS
15.0000 mg | ORAL_TABLET | Freq: Once | ORAL | Status: AC
  Administered 2020-12-09: 15 mg via ORAL
  Filled 2020-12-09: qty 2

## 2020-12-09 NOTE — ED Notes (Signed)
Pt to ED c/o LLQ abdominal pain that radiates to lower L back. Denies pain, burning with urination. LNMP was 11/24/20.  Pt was told by PCP 5d ago that has hernia in LLQ area and was advised to go to ED if began having increased pain. Pain increased 2d ago.

## 2020-12-09 NOTE — ED Notes (Signed)
Pt to US.

## 2020-12-09 NOTE — ED Notes (Signed)
ED out of POC urine pregnancy tests. Called lab to send more. Lab staff Jaynie Collins) stated they would complete POC pregnancy test. Task was already clicked off before this nurse saw that there were no POC tests in main ED. Urine sent to lab.

## 2020-12-09 NOTE — ED Notes (Signed)
Called lab to inform new urine sample just sent for urine pregnancy and UA complete with microscope. Called CT to inform new urine being sent to lab for pregnancy test.

## 2020-12-09 NOTE — ED Triage Notes (Signed)
Pt states that she started having LLQ pain 2 weeks ago but now the pain has moved into her back- pt denies any urinary symptoms- pt states last period was feb 5th

## 2020-12-09 NOTE — ED Notes (Signed)
CT called to ask if POC pregnancy test resulted. Had spoken with Jaynie Collins in lab earlier (see blank note) who had said he would run POC pregnancy test once urine sent. Called lab now, they say they never received urine sample. Urine sample was sent via tube station with sunquest label almost 1 hour ago (see ED narrator).  Main ED still out of POC urine pregnancy tests.  Currently on phone with [facility] in lab. They are looking for missing urine specimen.

## 2020-12-09 NOTE — ED Notes (Signed)
Pt states will void again for urine sample.

## 2020-12-09 NOTE — ED Notes (Signed)
Provider at bedside

## 2020-12-09 NOTE — ED Provider Notes (Signed)
[facility] Emergency Department Provider Note  ____________________________________________  Time seen: Approximately 3:27 PM  I have reviewed the triage vital signs and the nursing notes.   HISTORY  Chief Complaint Abdominal Pain    HPI Mandy Romero is a 25 y.o. female who presents the emergency department complaining of left flank/side pain.  Patient states that she has a history of adhesions in the pelvis, previous ovarian cyst with ovarian cystectomy.  Patient states that she has had some pain over the past 2 weeks.  She saw OB/GYN who advised her that they believe she had an inguinal hernia.  Patient states the pain did start after lifting some boxes at her work.  Sometimes it is more in the anterior aspect of the left lower quadrant, sometimes it is more along the lateral border of the spine.  She is not had no hematuria, polyuria or dysuria.  No vaginal bleeding or discharge.  Patient is concerned that she may have had a return of her ovarian cyst.  No radiation of pain.  No medications prior to arrival.   Patient denies any GI complaints.  No fevers or chills.        Past Medical History:  Diagnosis Date  . Migraine   . Ovarian cyst   . Peritonsillar abscess   . Preterm labor     Patient Active Problem List   Diagnosis Date Noted  . Ovarian cyst 07/30/2020  . Pelvic adhesions 07/30/2020  . Pelvic pain in female 07/30/2020  . Vaginal bleeding in pregnancy, third trimester 01/23/2017  . Migraine 01/08/2017  . Abnormal glucose tolerance in pregnancy 12/25/2016  . Pelvic pain in pregnancy, antepartum, third trimester 12/21/2016  . Headache in pregnancy 11/27/2016  . H/O preterm delivery, currently pregnant, third trimester 09/01/2016  . Late prenatal care affecting pregnancy in second trimester 09/01/2016  . Obesity (BMI 30-39.9) 08/30/2016  . Maternal varicella, non-immune 08/30/2016    Past Surgical History:  Procedure Laterality  Date  . CHOLECYSTECTOMY    . LAPAROSCOPIC LYSIS OF ADHESIONS  07/30/2020   Procedure: LAPAROSCOPIC LYSIS OF PELVIC ADHESIONS;  Surgeon: Linzie Collin, MD;  Location: [facility] ORS;  Service: Gynecology;;  . LAPAROSCOPIC OVARIAN CYSTECTOMY Left 07/30/2020   Procedure: LAPAROSCOPIC OVARIAN CYSTECTOMY;  Surgeon: Linzie Collin, MD;  Location: [facility] ORS;  Service: Gynecology;  Laterality: Left;    Prior to Admission medications   Medication Sig Start Date End Date Taking? Authorizing Provider  meloxicam (MOBIC) 15 MG tablet Take 1 tablet (15 mg total) by mouth daily. 12/09/20  Yes Lorissa Kishbaugh, Delorise Royals, PA-C  traMADol (ULTRAM) 50 MG tablet Take 1 tablet (50 mg total) by mouth every 6 (six) hours as needed. 12/09/20  Yes Lamar Naef, Delorise Royals, PA-C  Norethindrone-Ethinyl Estradiol-Fe Biphas (LO LOESTRIN FE) 1 MG-10 MCG / 10 MCG tablet Take 1 tablet by mouth at bedtime. Begin pills on 1st day of menses. 09/20/20 12/19/20  Linzie Collin, MD    Allergies Patient has no known allergies.  Family History  Problem Relation Age of Onset  . Diabetes Father   . Hypertension Father   . Cancer Maternal Aunt   . Gestational diabetes Sister     Social History Social History   Tobacco Use  . Smoking status: Never Smoker  . Smokeless tobacco: Never Used  Vaping Use  . Vaping Use: Never used  Substance Use Topics  . Alcohol use: Yes    Comment: rare  . Drug use: No  Review of Systems  Constitutional: No fever/chills Eyes: No visual changes. No discharge ENT: No upper respiratory complaints. Cardiovascular: no chest pain. Respiratory: no cough. No SOB. Gastrointestinal: Left lower quadrant/left side/left flank pain.  No nausea, no vomiting.  No diarrhea.  No constipation. Genitourinary: Negative for dysuria. No hematuria.  No vaginal bleeding or discharge Musculoskeletal: Negative for musculoskeletal pain. Skin: Negative for rash, abrasions, lacerations, ecchymosis. Neurological:  Negative for headaches, focal weakness or numbness.  10 System ROS otherwise negative.  ____________________________________________   PHYSICAL EXAM:  VITAL SIGNS: ED Triage Vitals  Enc Vitals Group     BP 12/09/20 1140 112/72     Pulse Rate 12/09/20 1140 75     Resp 12/09/20 1140 16     Temp 12/09/20 1140 98.2 F (36.8 C)     Temp Source 12/09/20 1140 Oral     SpO2 12/09/20 1140 98 %     Weight 12/09/20 1138 193 lb (87.5 kg)     Height 12/09/20 1138 5' (1.524 m)     Head Circumference --      Peak Flow --      Pain Score 12/09/20 1138 8     Pain Loc --      Pain Edu? --      Excl. in GC? --      Constitutional: Alert and oriented. Well appearing and in no acute distress. Eyes: Conjunctivae are normal. PERRL. EOMI. Head: Atraumatic. ENT:      Ears:       Nose: No congestion/rhinnorhea.      Mouth/Throat: Mucous membranes are moist.  Neck: No stridor.    Cardiovascular: Normal rate, regular rhythm. Normal S1 and S2.  Good peripheral circulation. Respiratory: Normal respiratory effort without tachypnea or retractions. Lungs CTAB. Good air entry to the bases with no decreased or absent breath sounds. Gastrointestinal: Bowel sounds 4 quadrants.  Soft and nontender to palpation over the quadrant and suprapubic region.  Patient was tender along the lateral aspect of the left abdominal wall.  No palpable abnormality in this region.. No guarding or rigidity. No palpable masses. No distention. No CVA tenderness. Musculoskeletal: Full range of motion to all extremities. No gross deformities appreciated. Neurologic:  Normal speech and language. No gross focal neurologic deficits are appreciated.  Skin:  Skin is warm, dry and intact. No rash noted. Psychiatric: Mood and affect are normal. Speech and behavior are normal. Patient exhibits appropriate insight and judgement.   ____________________________________________   LABS (all labs ordered are listed, but only abnormal  results are displayed)  Labs Reviewed  COMPREHENSIVE METABOLIC PANEL - Abnormal; Notable for the following components:      Result Value   Glucose, Bld 102 (*)    Total Protein 8.2 (*)    All other components within normal limits  URINALYSIS, COMPLETE (UACMP) WITH MICROSCOPIC - Abnormal; Notable for the following components:   Color, Urine YELLOW (*)    APPearance HAZY (*)    Hgb urine dipstick SMALL (*)    Leukocytes,Ua TRACE (*)    All other components within normal limits  LIPASE, BLOOD  CBC  PREGNANCY, URINE  POC URINE PREG, ED   ____________________________________________  EKG   ____________________________________________  RADIOLOGY I personally viewed and evaluated these images as part of my medical decision making, as well as reviewing the written report by the radiologist.  ED Provider Interpretation:   CT Renal Stone Study  Result Date: 12/09/2020 CLINICAL DATA:  Left lower quadrant pain for 2 weeks  which has moved into the patient's back. EXAM: CT ABDOMEN AND PELVIS WITHOUT CONTRAST TECHNIQUE: Multidetector CT imaging of the abdomen and pelvis was performed following the standard protocol without IV contrast. COMPARISON:  CT abdomen and pelvis 04/03/2020. FINDINGS: Lower chest: Lung bases clear.  No pleural or pericardial effusion. Hepatobiliary: No focal liver abnormality is seen. Status post cholecystectomy. No biliary dilatation. There is diffuse fatty infiltration of the liver. Pancreas: Unremarkable. No pancreatic ductal dilatation or surrounding inflammatory changes. Spleen: Normal in size without focal abnormality. Adrenals/Urinary Tract: Adrenal glands are unremarkable. Kidneys are normal, without renal calculi, focal lesion, or hydronephrosis. Bladder is unremarkable. Stomach/Bowel: Stomach is within normal limits. Appendix appears normal. No evidence of bowel wall thickening, distention, or inflammatory changes. Vascular/Lymphatic: No significant vascular  findings are present. No enlarged abdominal or pelvic lymph nodes. Reproductive: The left ovary is enlarged measuring approximately 4.8 cm craniocaudal x 6.9 cm AP x 5 cm transverse. The ovary measures 5.8 x 4.1 x 3.6 cm on the prior CT. Ill-defined cystic lesions in the left ovary measure up to approximately 3.2 cm in diameter. The right ovary and uterus appear normal. Other: Small volume of free pelvic fluid is seen. Musculoskeletal: No acute or focal abnormality. IMPRESSION: Enlarged left ovary cannot be definitively characterized. The ovary was prominent on the prior CT but has increased in size. Recommend pelvic ultrasound with Doppler imaging for further evaluation. Negative for urinary tract stone. Fatty infiltration of the liver. Electronically Signed   By: Drusilla Kanner M.D.   On: 12/09/2020 16:51   US PELVIC COMPLETE W TRANSVAGINAL AND TORSION R/O  Result Date: 12/09/2020 CLINICAL DATA:  Pelvic pain. Mass seen in left adnexa on CT scan from today and multiple previous ultrasounds. EXAM: TRANSABDOMINAL AND TRANSVAGINAL ULTRASOUND OF PELVIS DOPPLER ULTRASOUND OF OVARIES TECHNIQUE: Both transabdominal and transvaginal ultrasound examinations of the pelvis were performed. Transabdominal technique was performed for global imaging of the pelvis including uterus, ovaries, adnexal regions, and pelvic cul-de-sac. It was necessary to proceed with endovaginal exam following the transabdominal exam to visualize the endometrium and ovaries. Color and duplex Doppler ultrasound was utilized to evaluate blood flow to the ovaries. COMPARISON:  CT scan December 09, 2020. Pelvic ultrasounds April 03, 2020, May 15, 2020, and July 21, 2020. FINDINGS: Uterus Measurements: 9.1 x 4.6 x 5.2 cm = volume: 112 mL. No fibroids or other mass visualized. Endometrium Thickness: 8 mm.  No focal abnormality visualized. Right ovary Measurements: 3.7 x 2.3 x 1.7 cm = volume: 7.9 mL. Normal appearance/no adnexal mass. Left ovary  Measurements: 7.4 x 4.9 by 4.5 cm = volume: 85.5 mL. There is a left adnexal mass. This mass has changed in appearance over time. In June of 2021, there was a large hyperechoic portion of this mass and an endometrioma would have been been favored. By July 21, 2020, the mass was more cystic in appearance and a hemorrhagic cyst was favored. Today, the mass is largely solid in appearance with a small anterior cystic component. There is arterial and venous blood flow within the periphery of this mass. The mass is clearly larger in appearance when compared to June of 2021. Pulsed Doppler evaluation of both ovaries demonstrates normal low-resistance arterial and venous waveforms. Other findings There is a small amount of complicated fluid in the pelvis. IMPRESSION: 1. There is a complex solid and cystic left adnexal mass, likely arising from the left ovary, which has evolved over time as above. Differential considerations would include neoplasm and endometrioma. A  dermoid is less likely. A hemorrhagic cyst is less likely. Recommend gynecologic consultation. An MRI may better characterize this mass. 2. No other abnormalities. Electronically Signed   By: Gerome Samavid  Williams III M.D   On: 12/09/2020 18:16    ____________________________________________    PROCEDURES  Procedure(s) performed:    Procedures    Medications  HYDROcodone-acetaminophen (NORCO/VICODIN) 5-325 MG per tablet 1 tablet (has no administration in time range)  meloxicam (MOBIC) tablet 15 mg (has no administration in time range)     ____________________________________________   INITIAL IMPRESSION / ASSESSMENT AND PLAN / ED COURSE  Pertinent labs & imaging results that were available during my care of the patient were reviewed by me and considered in my medical decision making (see chart for details).  Review of the [place] CSRS was performed in accordance of the [facility] prior to dispensing any controlled drugs.           Patient's  diagnosis is consistent with adnexal mass.  Patient presented to emergency department with nondistinct left side/flank pain.  Patient states that symptoms have been ongoing x2 to 3 weeks.  She states that they seem to begin after lifting a box at work.  Patient had seen her OB/GYN as she has a history of previous pelvic adhesions, ovarian cyst and was diagnosed with a likely hernia.  Patient symptoms persisted so she presents the emergency department for evaluation.  Overall exam was reassuring with some mild tenderness along the lateral abdominal wall.  No suprapubic tenderness, pelvic tenderness or flank/CVA tenderness.  Given her extensive history of adhesions, possible hernia, previous cystic structure on her ovary, CT scan was ordered.  CT revealed a ill-defined ovarian finding and recommended ultrasound.  Ultrasound revealed a complex solid cystic left adnexal mass.  Patient is in no distress currently.  At this time patient will have medications for symptom relief and follow-up with OB/GYN for further determination of ongoing cystic structure in the left ovary.  Patient is given ED precautions to return to the ED for any worsening or new symptoms.     ____________________________________________  FINAL CLINICAL IMPRESSION(S) / ED DIAGNOSES  Final diagnoses:  Pelvic pain  Cyst of left ovary      NEW MEDICATIONS STARTED DURING THIS VISIT:  ED Discharge Orders         Ordered    meloxicam (MOBIC) 15 MG tablet  Daily        12/09/20 1902    traMADol (ULTRAM) 50 MG tablet  Every 6 hours PRN        12/09/20 1902              This chart was dictated using voice recognition software/Dragon. Despite best efforts to proofread, errors can occur which can change the meaning. Any change was purely unintentional.    Racheal PatchesCuthriell, Yuchen Fedor D, PA-C 12/09/20 1904    Chesley NoonJessup, Charles, MD 12/10/20 [phone]

## 2020-12-12 ENCOUNTER — Ambulatory Visit: Payer: Medicaid Other | Admitting: Obstetrics and Gynecology

## 2020-12-18 ENCOUNTER — Ambulatory Visit: Payer: Medicaid Other | Admitting: Obstetrics and Gynecology

## 2020-12-28 ENCOUNTER — Ambulatory Visit: Payer: Medicaid Other | Admitting: Obstetrics and Gynecology

## 2021-01-02 ENCOUNTER — Encounter: Payer: Self-pay | Admitting: Obstetrics and Gynecology

## 2021-01-02 ENCOUNTER — Other Ambulatory Visit: Payer: Self-pay

## 2021-01-02 ENCOUNTER — Ambulatory Visit (INDEPENDENT_AMBULATORY_CARE_PROVIDER_SITE_OTHER): Payer: Medicaid Other | Admitting: Obstetrics and Gynecology

## 2021-01-02 VITALS — BP 101/69 | HR 67 | Ht 60.0 in | Wt 190.3 lb

## 2021-01-02 DIAGNOSIS — E669 Obesity, unspecified: Secondary | ICD-10-CM

## 2021-01-02 DIAGNOSIS — R102 Pelvic and perineal pain: Secondary | ICD-10-CM

## 2021-01-02 DIAGNOSIS — N83299 Other ovarian cyst, unspecified side: Secondary | ICD-10-CM | POA: Diagnosis not present

## 2021-01-02 NOTE — Progress Notes (Signed)
HPI:      Mandy Romero is a 25 y.o. [phone] who LMP was Patient's last menstrual period was 12/22/2020.  Subjective:   She presents today after being seen in the emergency department approximately 2 weeks ago for left-sided pain.  She underwent a CT and an ultrasound which revealed what appears to be more of a solid left sided ovarian cyst.  She has had similar issues in the past and perhaps it is the same cyst changing in character.  They do reported as being larger than before. Patient says she is able to function and go about her daily activities but she has relatively constant pressure and occasional left-sided pain.    Hx: The following portions of the patient's history were reviewed and updated as appropriate:             She  has a past medical history of Migraine, Ovarian cyst, Ovarian cyst, Peritonsillar abscess, and Preterm labor. She does not have any pertinent problems on file. She  has a past surgical history that includes Cholecystectomy; Laparoscopic ovarian cystectomy (Left, 07/30/2020); and Laparoscopic lysis of adhesions (07/30/2020). Her family history includes Cancer in her maternal aunt; Diabetes in her father; Gestational diabetes in her sister; Hypertension in her father. She  reports that she has never smoked. She has never used smokeless tobacco. She reports current alcohol use. She reports that she does not use drugs. She has a current medication list which includes the following prescription(s): meloxicam, lo loestrin fe, and tramadol. She has No Known Allergies.       Review of Systems:  Review of Systems  Constitutional: Denied constitutional symptoms, night sweats, recent illness, fatigue, fever, insomnia and weight loss.  Eyes: Denied eye symptoms, eye pain, photophobia, vision change and visual disturbance.  Ears/Nose/Throat/Neck: Denied ear, nose, throat or neck symptoms, hearing loss, nasal discharge, sinus congestion and sore throat.   Cardiovascular: Denied cardiovascular symptoms, arrhythmia, chest pain/pressure, edema, exercise intolerance, orthopnea and palpitations.  Respiratory: Denied pulmonary symptoms, asthma, pleuritic pain, productive sputum, cough, dyspnea and wheezing.  Gastrointestinal: Denied, gastro-esophageal reflux, melena, nausea and vomiting.  Genitourinary: See HPI for additional information.  Musculoskeletal: Denied musculoskeletal symptoms, stiffness, swelling, muscle weakness and myalgia.  Dermatologic: Denied dermatology symptoms, rash and scar.  Neurologic: Denied neurology symptoms, dizziness, headache, neck pain and syncope.  Psychiatric: Denied psychiatric symptoms, anxiety and depression.  Endocrine: Denied endocrine symptoms including hot flashes and night sweats.   Meds:   Current Outpatient Medications on File Prior to Visit  Medication Sig Dispense Refill  . meloxicam (MOBIC) 15 MG tablet Take 1 tablet (15 mg total) by mouth daily. 30 tablet 0  . Norethindrone-Ethinyl Estradiol-Fe Biphas (LO LOESTRIN FE) 1 MG-10 MCG / 10 MCG tablet Take 1 tablet by mouth at bedtime. Begin pills on 1st day of menses. 28 tablet 2  . traMADol (ULTRAM) 50 MG tablet Take 1 tablet (50 mg total) by mouth every 6 (six) hours as needed. (Patient not taking: Reported on 01/02/2021) 10 tablet 0   No current facility-administered medications on file prior to visit.          Objective:     Vitals:   01/02/21 1057  BP: 101/69  Pulse: 67   Filed Weights   01/02/21 1057  Weight: 190 lb 4.8 oz (86.3 kg)              ultrasound results reviewed directly with the patient  Assessment:    [id number] Patient Active Problem List  Diagnosis Date Noted  . Ovarian cyst 07/30/2020  . Pelvic adhesions 07/30/2020  . Pelvic pain in female 07/30/2020  . Vaginal bleeding in pregnancy, third trimester 01/23/2017  . Migraine 01/08/2017  . Abnormal glucose tolerance in pregnancy 12/25/2016  . Pelvic pain in  pregnancy, antepartum, third trimester 12/21/2016  . Headache in pregnancy 11/27/2016  . H/O preterm delivery, currently pregnant, third trimester 09/01/2016  . Late prenatal care affecting pregnancy in second trimester 09/01/2016  . Obesity (BMI 30-39.9) 08/30/2016  . Maternal varicella, non-immune 08/30/2016     1. Pelvic pain in female   2. Complex ovarian cyst   3. 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  Repeat ROMA testing  2.  If ROMA negative I would recommend laparoscopic left oophorectomy because of the solid nature of this tumor as well as its recent enlargement/persistence.  I have discussed the above with the patient answered all of her questions. Orders Orders Placed This Encounter  Procedures  . Ovarian Malignancy Risk-ROMA    No orders of the defined types were placed in this encounter.     F/U  Return in about 2 weeks (around 01/16/2021). I spent 23 minutes involved in the care of this patient preparing to see the patient by obtaining and reviewing her medical history (including labs, imaging tests and prior procedures), documenting clinical information in the electronic health record (EHR), counseling and coordinating care plans, writing and sending prescriptions, ordering tests or procedures and directly communicating with the patient by discussing pertinent items from her history and physical exam as well as detailing my assessment and plan as noted above so that she has an informed understanding.  All of her questions were answered.  Elonda Husky, M.D. 01/02/2021 11:27 AM

## 2021-01-03 LAB — OVARIAN MALIGNANCY RISK-ROMA
Cancer Antigen (CA) 125: 98.3 U/mL — ABNORMAL HIGH (ref 0.0–38.1)
HE4: 41.7 pmol/L (ref 0.0–61.2)
Postmenopausal ROMA: 2.99 — ABNORMAL HIGH
Premenopausal ROMA: 0.56

## 2021-01-03 LAB — POSTMENOPAUSAL INTERP: HIGH

## 2021-01-03 LAB — PREMENOPAUSAL INTERP: LOW

## 2021-01-07 ENCOUNTER — Telehealth: Payer: Self-pay

## 2021-01-07 ENCOUNTER — Telehealth: Payer: Self-pay | Admitting: Obstetrics and Gynecology

## 2021-01-07 NOTE — Telephone Encounter (Signed)
New message    Returning call back to nurse from today

## 2021-01-08 ENCOUNTER — Encounter: Payer: Medicaid Other | Admitting: Obstetrics and Gynecology

## 2021-01-08 NOTE — Telephone Encounter (Signed)
LM for patient to return call to schedule appointment with Dr. Logan Bores.

## 2021-01-08 NOTE — Telephone Encounter (Signed)
Patient has appointment scheduled for 01/16/2021.

## 2021-01-09 ENCOUNTER — Encounter: Payer: Self-pay | Admitting: Obstetrics and Gynecology

## 2021-01-09 ENCOUNTER — Ambulatory Visit (INDEPENDENT_AMBULATORY_CARE_PROVIDER_SITE_OTHER): Payer: Medicaid Other | Admitting: Obstetrics and Gynecology

## 2021-01-09 ENCOUNTER — Other Ambulatory Visit: Payer: Self-pay

## 2021-01-09 VITALS — BP 103/67 | HR 70 | Ht 60.0 in | Wt 192.6 lb

## 2021-01-09 DIAGNOSIS — R971 Elevated cancer antigen 125 [CA 125]: Secondary | ICD-10-CM

## 2021-01-09 DIAGNOSIS — R102 Pelvic and perineal pain: Secondary | ICD-10-CM

## 2021-01-09 DIAGNOSIS — N83299 Other ovarian cyst, unspecified side: Secondary | ICD-10-CM

## 2021-01-09 NOTE — Progress Notes (Signed)
HPI:      [name] is a 25 y.o. [phone] who LMP was Patient's last menstrual period was 12/22/2020.  Subjective:   She presents today to discuss her most recent ROMA scores.  She continues to experience occasional left-sided pelvic pain.  It is not disabling.    Hx: The following portions of the patient's history were reviewed and updated as appropriate:             She  has a past medical history of Migraine, Ovarian cyst, Ovarian cyst, Peritonsillar abscess, and Preterm labor. She does not have any pertinent problems on file. She  has a past surgical history that includes Cholecystectomy; Laparoscopic ovarian cystectomy (Left, 07/30/2020); and Laparoscopic lysis of adhesions (07/30/2020). Her family history includes Cancer in her maternal aunt; Diabetes in her father; Gestational diabetes in her sister; Hypertension in her father. She  reports that she has never smoked. She has never used smokeless tobacco. She reports current alcohol use. She reports that she does not use drugs. She has a current medication list which includes the following prescription(s): meloxicam, lo loestrin fe, and tramadol. She has No Known Allergies.       Review of Systems:  Review of Systems  Constitutional: Denied constitutional symptoms, night sweats, recent illness, fatigue, fever, insomnia and weight loss.  Eyes: Denied eye symptoms, eye pain, photophobia, vision change and visual disturbance.  Ears/Nose/Throat/Neck: Denied ear, nose, throat or neck symptoms, hearing loss, nasal discharge, sinus congestion and sore throat.  Cardiovascular: Denied cardiovascular symptoms, arrhythmia, chest pain/pressure, edema, exercise intolerance, orthopnea and palpitations.  Respiratory: Denied pulmonary symptoms, asthma, pleuritic pain, productive sputum, cough, dyspnea and wheezing.  Gastrointestinal: Denied, gastro-esophageal reflux, melena, nausea and vomiting.  Genitourinary: Denied genitourinary  symptoms including symptomatic vaginal discharge, pelvic relaxation issues, and urinary complaints.  Musculoskeletal: Denied musculoskeletal symptoms, stiffness, swelling, muscle weakness and myalgia.  Dermatologic: Denied dermatology symptoms, rash and scar.  Neurologic: Denied neurology symptoms, dizziness, headache, neck pain and syncope.  Psychiatric: Denied psychiatric symptoms, anxiety and depression.  Endocrine: Denied endocrine symptoms including hot flashes and night sweats.   Meds:   Current Outpatient Medications on File Prior to Visit  Medication Sig Dispense Refill  . meloxicam (MOBIC) 15 MG tablet Take 1 tablet (15 mg total) by mouth daily. (Patient not taking: Reported on 01/09/2021) 30 tablet 0  . Norethindrone-Ethinyl Estradiol-Fe Biphas (LO LOESTRIN FE) 1 MG-10 MCG / 10 MCG tablet Take 1 tablet by mouth at bedtime. Begin pills on 1st day of menses. 28 tablet 2  . traMADol (ULTRAM) 50 MG tablet Take 1 tablet (50 mg total) by mouth every 6 (six) hours as needed. (Patient not taking: No sig reported) 10 tablet 0   No current facility-administered medications on file prior to visit.          Objective:     Vitals:   01/09/21 0855  BP: 103/67  Pulse: 70   Filed Weights   01/09/21 0855  Weight: 192 lb 9.6 oz (87.4 kg)                Assessment:    [id number] Patient Active Problem List   Diagnosis Date Noted  . Ovarian cyst 07/30/2020  . Pelvic adhesions 07/30/2020  . Pelvic pain in female 07/30/2020  . Vaginal bleeding in pregnancy, third trimester 01/23/2017  . Migraine 01/08/2017  . Abnormal glucose tolerance in pregnancy 12/25/2016  . Pelvic pain in pregnancy, antepartum, third trimester 12/21/2016  . Headache in pregnancy  11/27/2016  . H/O preterm delivery, currently pregnant, third trimester 09/01/2016  . Late prenatal care affecting pregnancy in second trimester 09/01/2016  . Obesity (BMI 30-39.9) 08/30/2016  . Maternal varicella, non-immune  08/30/2016     1. Complex ovarian cyst   2. Pelvic pain in female   3. Elevated CA-125     Pain is not disabling.  Ovarian cyst seems to have changed in character from a cystic mass to a hemorrhagic cystic mass to more of a solid mass at this time.  Elevated CA-125 changed in the last 6 months.  Normal He4.   Plan:            1.  Have discussed her most recent lab results in light of the changing left ovarian mass.  I have discussed this with Dr. Johnnette Litter in some detail.  It remains likely a benign process-possible endometrioma causing the complex mass as well as the elevation in CA-125.  However, referral to GYN oncology for second opinion. It is likely this mass should be surgically removed the question is only where should it be done and who should do it based on the likelihood of ovarian cancer. I have discussed all the above in detail with the patient I have answered all of her questions.  Orders Orders Placed This Encounter  Procedures  . Ambulatory referral to Gynecologic Oncology    No orders of the defined types were placed in this encounter.     F/U  No follow-ups on file. I spent 28 minutes involved in the care of this patient preparing to see the patient by obtaining and reviewing her medical history (including labs, imaging tests and prior procedures), documenting clinical information in the electronic health record (EHR), counseling and coordinating care plans, writing and sending prescriptions, ordering tests or procedures and directly communicating with the patient by discussing pertinent items from her history and physical exam as well as detailing my assessment and plan as noted above so that she has an informed understanding.  All of her questions were answered.  Elonda Husky, M.D. 01/09/2021 9:18 AM

## 2021-01-16 ENCOUNTER — Encounter: Payer: Medicaid Other | Admitting: Obstetrics and Gynecology

## 2021-01-18 ENCOUNTER — Ambulatory Visit (INDEPENDENT_AMBULATORY_CARE_PROVIDER_SITE_OTHER): Payer: Medicaid Other | Admitting: Obstetrics and Gynecology

## 2021-01-18 ENCOUNTER — Other Ambulatory Visit: Payer: Self-pay

## 2021-01-18 ENCOUNTER — Encounter: Payer: Self-pay | Admitting: Obstetrics and Gynecology

## 2021-01-18 VITALS — BP 95/64 | HR 84 | Resp 16 | Ht 60.0 in | Wt 193.3 lb

## 2021-01-18 DIAGNOSIS — N83299 Other ovarian cyst, unspecified side: Secondary | ICD-10-CM

## 2021-01-18 NOTE — Progress Notes (Signed)
Patient came for an appointment but I see no reason for her visit and she does not know what she has a visit scheduled. She has a GYN oncology appointment next Wednesday that she plans to go to. It is very likely after that visit we will schedule another appointment for preop.

## 2021-01-23 ENCOUNTER — Inpatient Hospital Stay: Payer: Medicaid Other | Attending: Obstetrics and Gynecology | Admitting: Obstetrics and Gynecology

## 2021-01-23 ENCOUNTER — Other Ambulatory Visit: Payer: Self-pay

## 2021-01-23 VITALS — BP 114/79 | HR 61 | Temp 98.5°F | Resp 16 | Wt 192.9 lb

## 2021-01-23 DIAGNOSIS — R102 Pelvic and perineal pain: Secondary | ICD-10-CM | POA: Insufficient documentation

## 2021-01-23 DIAGNOSIS — N83209 Unspecified ovarian cyst, unspecified side: Secondary | ICD-10-CM

## 2021-01-23 DIAGNOSIS — N83202 Unspecified ovarian cyst, left side: Secondary | ICD-10-CM

## 2021-01-23 NOTE — Progress Notes (Signed)
Gynecologic Oncology Consult Visit   Referring Provider:  Dr Logan BoresEvans  Chief Concern: Ovarian mass  Subjective:  Mandy Romero is a 25 y.o. 39P3 female who is seen in consultation from Dr. Logan BoresEvans for ovarian mass.  Pelvic pain last year and 04/03/20 seen in [facility].  CT renal IMPRESSION: 1. Negative for hydronephrosis or ureteral stone. Thick-walled urinary bladder likely due to under distension though cystitis could also be considered 2. Generalized edema/haziness within the pelvic mesenteric fat, suggesting nonspecific pelvic inflammatory process. 4.4 cm hypodense left adnexal lesion corresponding to the complex cyst on sonography with differential considerations as previously described. The appearance is not highly suggestive of an abscess. 3. Small amount of free fluid in the pelvis.    US PELVIC COMPLETE W TRANSVAGINAL AND TORSION R/O Uterus Measurements: 9.6 x 4.4 x 5.3 cm = volume: 118 mL. No fibroids or other mass visualized. Endometrium Thickness: 5 mm in thickness.  No focal abnormality visualized. Right ovary Measurements: 3.3 x 1.9 x 1.6 cm = volume: 5 mL. Normal appearance/no adnexal mass. Left ovary Measurements: 4.8 x 3.3 x 3.9 cm = volume: 32 mL. 3.6 cm complex heterogeneous structure in the left ovary which is at least in partial cystic as well as areas that are hyperechoic. This could reflect a dermoid, endometrioma, or hemorrhagic cyst. Pulsed Doppler evaluation of both ovaries demonstrates normal low-resistance arterial and venous waveforms. Other findings Small amount of free fluid in the pelvis. IMPRESSION: Complex lesion in the left ovary with cystic and hyperechoic components. Differential considerations would include hemorrhagic cyst, endometrioma or dermoid. This could be followed with repeat ultrasound in 3-6 months to assess for stability/resolution.   04/05/20 saw Dr Logan BoresEvans and recommended f/u US in 6 weeks.  05/15/20 US The uterus is anteverted and measures 8.5 x 4.5 x  5.2 cm. Echo texture is homogenous without evidence of focal masses. The Endometrium measures 6 mm. Right Ovary measures 3.5 x 2.1 x 3.3 cm. It is normal in appearance. Left Ovary measures 6.4 x 4.2 x 5.2 cm. It is not normal in appearance. Lt ovarian cystic structure measuring 5.9 x 4. X 4.6 cm. Echogenic echoes throughout lesion with smooth boarders and through transmission is seen. This could suggest hemorraghic cyst VS solid lesion. There are no hypoechoic areas with-in this lesion that was seen on prior exam.  06/05/21 Saw Dr Logan BoresEvans and reports increased left lower quadrant pain.  06/09/20  CA125 35, HE4 17 (normal premenopausal ROMA)  07/21/20 ED visit for abdominal pain US - Uterus Measurements: 10.1 x 5.4 x 4.6 cm = volume: 132 mL. No fibroids or other mass visualized. Endometrium Thickness: 10 mm which is within normal limits. No focal abnormality visualized. Right ovary Measurements: 2.6 x 2.0 x 1.7 cm = volume: 4 mL. Normal appearance/no adnexal mass. Left ovary Measurements: 8.3 x 5.6 x 5.7 cm = volume: 140 mL. 8.3 x 5.7 x 5.6 cm predominantly cystic abnormality is noted in the left ovary with internal echoes suggesting hemorrhagic cyst. Pulsed Doppler evaluation of both ovaries demonstrates normal low-resistance arterial and venous waveforms. Other findings No abnormal free fluid. IMPRESSION: 1. 8.3 x 5.7 x 5.6 cm predominantly cystic abnormality is noted in the left ovary with internal echoes suggesting probable hemorrhagic cyst. Short-interval follow up ultrasound in 6-12 weeks is recommended, preferably during the week following the patient's normal menses. 2. No Doppler evidence of ovarian torsion is noted.  07/30/20 LS L ovarian cyst drainage and marsupialization It was noted that the left  ovary was enlarged and adherent to the left sidewall in the cul-de-sac as well as having some bowel adhesions.  The ovary was bluntly dissected off of the bowel and the bowel was moved out of the  pelvis.  We turned our attention to the large ovarian cyst and using blunt and sharp dissection were able to detach the ovary from doing left sidewall in the cul-de-sac.  During this process a hole was made in the cyst and was noted to be significant for old blood consistent with hemorrhagic cyst.  The cyst edges were carefully inspected and the base of the cyst was carefully inspected.  Hemostasis was noted.  Marsupialization of the ovary was performed. Good ovary anteriorly and posterior almost clamshell like was noted. No pathology sent.   09/20/20 Follow up visit and feeling well.  Started on OCP LO LOESTRIN FE  12/09/20  She presents to the ED with a few days of worsening left flank and groin pain.  Pregnancy testing is negative and UA shows no evidence of infection.  CT scan was performed and shows enlargement of left ovary.  This was further characterized with ultrasound that shows no evidence of torsion, but does show recurrence of apparent complex ovarian cyst.    CT scan Reproductive: The left ovary is enlarged measuring approximately 4.8 cm craniocaudal x 6.9 cm AP x 5 cm transverse. The ovary measures 5.8 x 4.1 x 3.6 cm on the prior CT. Ill-defined cystic lesions in the left ovary measure up to approximately 3.2 cm in diameter. The right ovary and uterus appear normal.  IMPRESSION: Enlarged left ovary cannot be definitively characterized. The ovary was prominent on the prior CT but has increased in size. Recommend pelvic ultrasound with Doppler imaging for further evaluation.  US Uterus Measurements: 9.1 x 4.6 x 5.2 cm = volume: 112 mL. No fibroids or other mass visualized. Endometrium  Thickness: 8 mm.  No focal abnormality visualized. Right ovary Measurements: 3.7 x 2.3 x 1.7 cm = volume: 7.9 mL. Normal appearance/no adnexal mass. Left ovary Measurements: 7.4 x 4.9 by 4.5 cm = volume: 85.5 mL. There is a left adnexal mass. This mass has changed in appearance over time. In  June of 2021, there was a large hyperechoic portion of this mass and an endometrioma would have been been favored. By July 21, 2020, the mass was more cystic in appearance and a hemorrhagic cyst was favored. Today, the mass is largely solid in appearance with a small anterior cystic component. There is arterial and venous blood flow within the periphery of this mass. The mass is clearly larger in appearance when compared to June of 2021. Pulsed Doppler evaluation of both ovaries demonstrates normal low-resistance arterial and venous waveforms. Other findings There is a small amount of complicated fluid in the pelvis.  IMPRESSION: 1. There is a complex solid and cystic left adnexal mass, likely arising from the left ovary, which has evolved over time as above. Differential considerations would include neoplasm and endometrioma. A dermoid is less likely. A hemorrhagic cyst is less likely. Recommend gynecologic consultation. An MRI may better characterize this mass.  01/03/21 CA125 = 98, HE4 41 (normal premenopausal ROMA)  PAP smears normal.  Problem List: Patient Active Problem List   Diagnosis Date Noted  . Ovarian cyst 07/30/2020  . Pelvic adhesions 07/30/2020  . Pelvic pain in female 07/30/2020  . Vaginal bleeding in pregnancy, third trimester 01/23/2017  . Migraine 01/08/2017  . Abnormal glucose tolerance in pregnancy 12/25/2016  .  Pelvic pain in pregnancy, antepartum, third trimester 12/21/2016  . Headache in pregnancy 11/27/2016  . H/O preterm delivery, currently pregnant, third trimester 09/01/2016  . Late prenatal care affecting pregnancy in second trimester 09/01/2016  . Obesity (BMI 30-39.9) 08/30/2016  . Maternal varicella, non-immune 08/30/2016    Past Medical History: Past Medical History:  Diagnosis Date  . Migraine   . Ovarian cyst   . Ovarian cyst   . Peritonsillar abscess   . Preterm labor     Past Surgical History: Past Surgical History:   Procedure Laterality Date  . CHOLECYSTECTOMY    . LAPAROSCOPIC LYSIS OF ADHESIONS  07/30/2020   Procedure: LAPAROSCOPIC LYSIS OF PELVIC ADHESIONS;  Surgeon: Linzie Collin, MD;  Location: [facility] ORS;  Service: Gynecology;;  . LAPAROSCOPIC OVARIAN CYSTECTOMY Left 07/30/2020   Procedure: LAPAROSCOPIC OVARIAN CYSTECTOMY;  Surgeon: Linzie Collin, MD;  Location: [facility] ORS;  Service: Gynecology;  Laterality: Left;    Past Gynecologic History:  History of OCP/HRT use: Lo Lo Estrin Fe  OB History:  OB History  Gravida Para Term Preterm AB Living  SAB IAB Ectopic Multiple Live Births        0 3    # Outcome Date GA Lbr Len/2nd Weight Sex Delivery Anes PTL Lv  3 Term 01/28/17 [redacted]w[redacted]d  6 lb 15.5 oz (3.16 kg) M Vag-Spont Other  LIV  2 Preterm 2016 [redacted]w[redacted]d  6 lb 12 oz (3.062 kg) M Vag-Spont None  LIV  1 Preterm 2013 [redacted]w[redacted]d  6 lb 11 oz (3.033 kg) M Vag-Spont None  LIV    Family History: Family History  Problem Relation Age of Onset  . Diabetes Father   . Hypertension Father   . Cancer Maternal Aunt   . Gestational diabetes Sister     Social History: Social History   Socioeconomic History  . Marital status: Single    Spouse name: Not on file  . Number of children: Not on file  . Years of education: Not on file  . Highest education level: Not on file  Occupational History  . Not on file  Tobacco Use  . Smoking status: Never Smoker  . Smokeless tobacco: Never Used  Vaping Use  . Vaping Use: Never used  Substance and Sexual Activity  . Alcohol use: Yes    Comment: rare  . Drug use: No  . Sexual activity: Yes    Birth control/protection: Condom, Pill  Other Topics Concern  . Not on file  Social History Narrative  . Not on file   Social Determinants of Health   Financial Resource Strain: Not on file  Food Insecurity: Not on file  Transportation Needs: Not on file  Physical Activity: Not on file  Stress: Not on file  Social Connections: Not on file   Intimate Partner Violence: Not on file    Allergies: No Known Allergies  Current Medications: Current Outpatient Medications  Medication Sig Dispense Refill  . Norethindrone-Ethinyl Estradiol-Fe Biphas (LO LOESTRIN FE) 1 MG-10 MCG / 10 MCG tablet Take 1 tablet by mouth at bedtime. Begin pills on 1st day of menses. 28 tablet 2   No current facility-administered medications for this visit.    Review of Systems General: negative for, fevers, chills, fatigue, changes in sleep, changes in weight or appetite Skin: negative for changes in color, texture, moles or lesions Eyes: negative for, changes in vision, pain, diplopia HEENT: negative for, change in hearing, pain, discharge, tinnitus,  vertigo, voice changes, sore throat, neck masses Breasts: negative for breast lumps Pulmonary: negative for, dyspnea, orthopnea, productive cough Cardiac: negative for, palpitations, syncope, pain, discomfort, pressure Gastrointestinal: negative for, dysphagia, nausea, vomiting, jaundice, pain, constipation, diarrhea, hematemesis, hematochezia Genitourinary/Sexual: negative for, dysuria, discharge, hesitancy, nocturia, retention, stones, infections, STD's, incontinence Musculoskeletal: negative for, pain, stiffness, swelling, range of motion limitation Hematology: negative for, easy bruising, bleeding Neurologic/Psych: negative for, headaches, seizures, paralysis, weakness, tremor, change in gait, change in sensation, mood swings, depression, anxiety, change in memory  Objective:  Physical Examination:  BP 114/79   Pulse 61   Temp 98.5 F (36.9 C)   Resp 16   Wt 192 lb 14.4 oz (87.5 kg)   SpO2 98%   BMI 37.67 kg/m    ECOG Performance Status: 0 - Asymptomatic  General appearance: alert, cooperative and appears stated age HEENT:neck supple with midline trachea and thyroid without masses Lymph node survey: non-palpable, axillary, inguinal, supraclavicular Cardiovascular: regular rate and  rhythm, no murmurs or gallops Respiratory: normal air entry, lungs clear to auscultation and no rales, rhonchi or wheezing Abdomen: soft, non-tender, without masses or organomegaly, no hernias and well healed incision Back: inspection of back is normal Extremities: extremities normal, atraumatic, no cyanosis or edema Skin exam - normal coloration and turgor, no rashes, no suspicious skin lesions noted. Neurological exam reveals alert, oriented, normal speech, no focal findings or movement disorder noted.  Pelvic: exam chaperoned by nurse, EGBUS within normal limits, normal vagina and vulva; Menstrual blood in vagina.  Adnexa: tender on left.  No masses palpable. normal adnexa in size, nontender and no masses; Uterus: uterus is normal size, shape, consistency and nontender; Cervix: anteverted; Rectal: normal rectal, no masses    Assessment:  Mandy Romero is a 25 y.o. P42 female diagnosed with persistent left adnexal mass. She developed pelvic pain 6/21 and was seen in the ED.  Imaging showed 4.8 cm left ovarian cyst.  CA125 and HE4 normal.  Referred to Dr Logan Bores and recommended course of expectant management.  Cyst increased in size to 8.3 cm, mainly cystic, and pain persisted so underwent laparoscopic surgery in 10/21 and found to have what appeared to be a hemorraghic left ovarian cyst that was drained and marsupialized.  No pathology sent.  She started on OCPs 12/21 and has regular menses.  Now left ovarian mass has increased in size again to 7.8 cm and mostly solid.  [place] suggests endometrioma vs neoplasm.  CA125 now elevated to 98 in 3/22 and still has some pain. Malignancy unlikely, but surgery indicated in view of persistent mass of 7-8 cm, predominantly solid character and pain.    Medical co-morbidities complicating care:   Obesity BMI 37 Plan:   Problem List Items Addressed This Visit      Endocrine   Ovarian cyst     Other   Pelvic pain in female - Primary     We discussed  options for management including surgery in view of persistence of 7-8 cm left ovarian mass that is mainly solid.  Plan would be for LS left ovarian cystectomy, possible LSO, possible surgical staging with omentectomy, peritoneal biopsies, pelvic/aortic lymph node dissection.  I can do the surgery with Dr Logan Bores or Valentino Saxon in the next few weeks. Most likely this is an endometrioma.  Since this mass is most likely benign, patient will return to see Dr Logan Bores for pre op evaluation.  In addition to routine labs would check inhibin A/B and CEA tumor markers to  cover granulosa cell tumor or mucinous tumor.    The patient's diagnosis, an outline of the further diagnostic and laboratory studies which will be required, the recommendation, and alternatives were discussed.  All questions were answered to the patient's satisfaction.  A total of 40 minutes were spent with the patient/family today; 50% was spent in education, counseling and coordination of care for left ovarian mass.    Leida Lauth, MD  CC:  Linzie Collin, MD [address] [place],  [place] [address] [phone]

## 2021-01-23 NOTE — Patient Instructions (Addendum)
Please contact Dr. Sharlot Gowda office to schedule appointment to discuss surgery.

## 2021-01-24 ENCOUNTER — Other Ambulatory Visit: Payer: Self-pay

## 2021-01-24 ENCOUNTER — Encounter: Payer: Self-pay | Admitting: Obstetrics and Gynecology

## 2021-01-24 ENCOUNTER — Ambulatory Visit (INDEPENDENT_AMBULATORY_CARE_PROVIDER_SITE_OTHER): Payer: Medicaid Other | Admitting: Obstetrics and Gynecology

## 2021-01-24 VITALS — BP 108/73 | HR 68 | Ht 60.0 in | Wt 194.0 lb

## 2021-01-24 DIAGNOSIS — N83299 Other ovarian cyst, unspecified side: Secondary | ICD-10-CM | POA: Diagnosis not present

## 2021-01-24 NOTE — Progress Notes (Signed)
HPI:      [name] is a 25 y.o. [phone] who LMP was Patient's last menstrual period was 01/18/2021.  Subjective:   She presents today for further discussion after having a consult with GYN oncology. I have reviewed Dr. Annye Romero office visit notes with her and discussed with her the necessity of surgery on her left ovary.    Hx: The following portions of the patient's history were reviewed and updated as appropriate:             She  has a past medical history of Migraine, Ovarian cyst, Ovarian cyst, Peritonsillar abscess, and Preterm labor. She does not have any pertinent problems on file. She  has a past surgical history that includes Cholecystectomy; Laparoscopic ovarian cystectomy (Left, 07/30/2020); and Laparoscopic lysis of adhesions (07/30/2020). Her family history includes Cancer in her maternal aunt; Diabetes in her father; Gestational diabetes in her sister; Hypertension in her father. She  reports that she has never smoked. She has never used smokeless tobacco. She reports current alcohol use. No history on file for drug use. She has a current medication list which includes the following prescription(s): lo loestrin fe. She has No Known Allergies.       Review of Systems:  Review of Systems  Constitutional: Denied constitutional symptoms, night sweats, recent illness, fatigue, fever, insomnia and weight loss.  Eyes: Denied eye symptoms, eye pain, photophobia, vision change and visual disturbance.  Ears/Nose/Throat/Neck: Denied ear, nose, throat or neck symptoms, hearing loss, nasal discharge, sinus congestion and sore throat.  Cardiovascular: Denied cardiovascular symptoms, arrhythmia, chest pain/pressure, edema, exercise intolerance, orthopnea and palpitations.  Respiratory: Denied pulmonary symptoms, asthma, pleuritic pain, productive sputum, cough, dyspnea and wheezing.  Gastrointestinal: Denied, gastro-esophageal reflux, melena, nausea and vomiting.   Genitourinary: Denied genitourinary symptoms including symptomatic vaginal discharge, pelvic relaxation issues, and urinary complaints.  Musculoskeletal: Denied musculoskeletal symptoms, stiffness, swelling, muscle weakness and myalgia.  Dermatologic: Denied dermatology symptoms, rash and scar.  Neurologic: Denied neurology symptoms, dizziness, headache, neck pain and syncope.  Psychiatric: Denied psychiatric symptoms, anxiety and depression.  Endocrine: Denied endocrine symptoms including hot flashes and night sweats.   Meds:   Current Outpatient Medications on File Prior to Visit  Medication Sig Dispense Refill  . Norethindrone-Ethinyl Estradiol-Fe Biphas (LO LOESTRIN FE) 1 MG-10 MCG / 10 MCG tablet Take 1 tablet by mouth at bedtime. Begin pills on 1st day of menses. 28 tablet 2   No current facility-administered medications on file prior to visit.          Objective:     Vitals:   01/24/21 0902  BP: 108/73  Pulse: 68   Filed Weights   01/24/21 0902  Weight: 194 lb (88 kg)                Assessment:    [id number] Patient Active Problem List   Diagnosis Date Noted  . Ovarian cyst 07/30/2020  . Pelvic adhesions 07/30/2020  . Pelvic pain in female 07/30/2020  . Vaginal bleeding in pregnancy, third trimester 01/23/2017  . Migraine 01/08/2017  . Abnormal glucose tolerance in pregnancy 12/25/2016  . Pelvic pain in pregnancy, antepartum, third trimester 12/21/2016  . Headache in pregnancy 11/27/2016  . H/O preterm delivery, currently pregnant, third trimester 09/01/2016  . Late prenatal care affecting pregnancy in second trimester 09/01/2016  . Obesity (BMI 30-39.9) 08/30/2016  . Maternal varicella, non-immune 08/30/2016     1. Complex ovarian cyst     Likely benign.  This  is recurrent and more solid at this time.   Plan:            1.  Coordinate surgical dates with Dr. Johnnette Romero with plan for laparoscopic left ovarian cystectomy/possible oophorectomy.  2.   CEA and inhibin A/B drawn today.  3.  Patient to return for preop H&P and scheduling.   All questions answered. Orders Orders Placed This Encounter  Procedures  . Inhibin A  . Inhibin B  . CEA    No orders of the defined types were placed in this encounter.     F/U  Return in about 1 week (around 01/31/2021). I spent 22 minutes involved in the care of this patient preparing to see the patient by obtaining and reviewing her medical history (including labs, imaging tests and prior procedures), documenting clinical information in the electronic health record (EHR), counseling and coordinating care plans, writing and sending prescriptions, ordering tests or procedures and directly communicating with the patient by discussing pertinent items from her history and physical exam as well as detailing my assessment and plan as noted above so that she has an informed understanding.  All of her questions were answered.  Mandy Romero, M.D. 01/24/2021 9:22 AM

## 2021-01-29 LAB — CEA: CEA: 1.1 ng/mL (ref 0.0–4.7)

## 2021-01-29 LAB — INHIBIN A: Inhibin A, Ultrasensitive: 2.3 pg/mL

## 2021-01-29 LAB — INHIBIN B: Inhibin B: 34.4 pg/mL

## 2021-01-31 ENCOUNTER — Other Ambulatory Visit: Payer: Self-pay

## 2021-01-31 ENCOUNTER — Encounter: Payer: Self-pay | Admitting: Obstetrics and Gynecology

## 2021-01-31 ENCOUNTER — Ambulatory Visit (INDEPENDENT_AMBULATORY_CARE_PROVIDER_SITE_OTHER): Payer: Medicaid Other | Admitting: Obstetrics and Gynecology

## 2021-01-31 VITALS — BP 106/72 | HR 66 | Ht 60.0 in | Wt 195.0 lb

## 2021-01-31 DIAGNOSIS — Z01818 Encounter for other preprocedural examination: Secondary | ICD-10-CM

## 2021-01-31 DIAGNOSIS — R971 Elevated cancer antigen 125 [CA 125]: Secondary | ICD-10-CM

## 2021-01-31 DIAGNOSIS — R102 Pelvic and perineal pain: Secondary | ICD-10-CM | POA: Diagnosis not present

## 2021-01-31 DIAGNOSIS — N83299 Other ovarian cyst, unspecified side: Secondary | ICD-10-CM

## 2021-01-31 NOTE — H&P (View-Only) (Signed)
     PRE-OPERATIVE HISTORY AND PHYSICAL EXAM  PCP:  [facility] Subjective:   HPI:  Mandy Romero is a 24 y.o. [id number].  Patient's last menstrual period was 01/18/2021.  She presents today for a pre-op discussion and PE.  She has the following symptoms: Pelvic pain-complex left ovarian cyst-elevated CA-125.  Review of Systems:   Constitutional: Denied constitutional symptoms, night sweats, recent illness, fatigue, fever, insomnia and weight loss.  Eyes: Denied eye symptoms, eye pain, photophobia, vision change and visual disturbance.  Ears/Nose/Throat/Neck: Denied ear, nose, throat or neck symptoms, hearing loss, nasal discharge, sinus congestion and sore throat.  Cardiovascular: Denied cardiovascular symptoms, arrhythmia, chest pain/pressure, edema, exercise intolerance, orthopnea and palpitations.  Respiratory: Denied pulmonary symptoms, asthma, pleuritic pain, productive sputum, cough, dyspnea and wheezing.  Gastrointestinal: Denied, gastro-esophageal reflux, melena, nausea and vomiting.  Genitourinary: See HPI for additional information.  Musculoskeletal: Denied musculoskeletal symptoms, stiffness, swelling, muscle weakness and myalgia.  Dermatologic: Denied dermatology symptoms, rash and scar.  Neurologic: Denied neurology symptoms, dizziness, headache, neck pain and syncope.  Psychiatric: Denied psychiatric symptoms, anxiety and depression.  Endocrine: Denied endocrine symptoms including hot flashes and night sweats.   OB History  Gravida Para Term Preterm AB Living  3 3 1 2   3  SAB IAB Ectopic Multiple Live Births        0 3    # Outcome Date GA Lbr Len/2nd Weight Sex Delivery Anes PTL Lv  3 Term 01/28/17 [redacted]w[redacted]d  6 lb 15.5 oz (3.16 kg) M Vag-Spont Other  LIV  2 Preterm 2016 [redacted]w[redacted]d  6 lb 12 oz (3.062 kg) M Vag-Spont None  LIV  1 Preterm 2013 [redacted]w[redacted]d  6 lb 11 oz (3.033 kg) M Vag-Spont None  LIV    Past Medical History:  Diagnosis Date  .  Migraine   . Ovarian cyst   . Ovarian cyst   . Peritonsillar abscess   . Preterm labor     Past Surgical History:  Procedure Laterality Date  . CHOLECYSTECTOMY    . LAPAROSCOPIC LYSIS OF ADHESIONS  07/30/2020   Procedure: LAPAROSCOPIC LYSIS OF PELVIC ADHESIONS;  Surgeon: Kaimani Clayson James, MD;  Location: [facility];  Service: Gynecology;;  . LAPAROSCOPIC OVARIAN CYSTECTOMY Left 07/30/2020   Procedure: LAPAROSCOPIC OVARIAN CYSTECTOMY;  Surgeon: Laiylah Roettger James, MD;  Location: [facility];  Service: Gynecology;  Laterality: Left;      SOCIAL HISTORY: Social History   Tobacco Use  Smoking Status Never Smoker  Smokeless Tobacco Never Used   Social History   Substance and Sexual Activity  Alcohol Use Yes   Comment: rare   Social History   Substance and Sexual Activity  Drug Use Not on file    Family History  Problem Relation Age of Onset  . Diabetes Father   . Hypertension Father   . Cancer Maternal Aunt   . Gestational diabetes Sister     ALLERGIES:  Patient has no known allergies.  MEDS:   Current Outpatient Medications on File Prior to Visit  Medication Sig Dispense Refill  . Norethindrone-Ethinyl Estradiol-Fe Biphas (LO LOESTRIN FE) 1 MG-10 MCG / 10 MCG tablet Take 1 tablet by mouth at bedtime. Begin pills on 1st day of menses. 28 tablet 2   No current facility-administered medications on file prior to visit.    No orders of the defined types were placed in this encounter.    Physical examination BP 106/72   Pulse 66   Ht   5' (1.524 m)   Wt 195 lb (88.5 kg)   LMP 01/18/2021   BMI 38.08 kg/m   General NAD, Conversant  HEENT Atraumatic; Op clear with mmm.  Normo-cephalic. Pupils reactive. Anicteric sclerae  Thyroid/Neck Smooth without nodularity or enlargement. Normal ROM.  Neck Supple.  Skin No rashes, lesions or ulceration. Normal palpated skin turgor. No nodularity.  Breasts: No masses or discharge.  Symmetric.  No axillary adenopathy.  Lungs:  Clear to auscultation.No rales or wheezes. Normal Respiratory effort, no retractions.  Heart: NSR.  No murmurs or rubs appreciated. No periferal edema  Abdomen: Soft.  Non-tender.  No masses.  No HSM. No hernia  Extremities: Moves all appropriately.  Normal ROM for age. No lymphadenopathy.  Neuro: Oriented to PPT.  Normal mood. Normal affect.     Pelvic:  Deferred to OR see ultrasound below    [place]: FINDINGS: Uterus  Measurements: 9.1 x 4.6 x 5.2 cm = volume: 112 mL. No fibroids or other mass visualized.  Endometrium  Thickness: 8 mm.  No focal abnormality visualized.  Right ovary  Measurements: 3.7 x 2.3 x 1.7 cm = volume: 7.9 mL. Normal appearance/no adnexal mass.  Left ovary  Measurements: 7.4 x 4.9 by 4.5 cm = volume: 85.5 mL. There is a left adnexal mass. This mass has changed in appearance over time. In June of 2021, there was a large hyperechoic portion of this mass and an endometrioma would have been been favored. By July 21, 2020, the mass was more cystic in appearance and a hemorrhagic cyst was favored. Today, the mass is largely solid in appearance with a small anterior cystic component. There is arterial and venous blood flow within the periphery of this mass. The mass is clearly larger in appearance when compared to June of 2021.  Pulsed Doppler evaluation of both ovaries demonstrates normal low-resistance arterial and venous waveforms.  Other findings  There is a small amount of complicated fluid in the pelvis.  IMPRESSION: 1. There is a complex solid and cystic left adnexal mass, likely arising from the left ovary, which has evolved over time as above. Differential considerations would include neoplasm and endometrioma. A dermoid is less likely. A hemorrhagic cyst is less likely. Recommend gynecologic consultation. An MRI may better characterize this mass. 2. No other abnormalities.     Assessment:   [id number] Patient Active Problem  List   Diagnosis Date Noted  . Ovarian cyst 07/30/2020  . Pelvic adhesions 07/30/2020  . Pelvic pain in female 07/30/2020  . Vaginal bleeding in pregnancy, third trimester 01/23/2017  . Migraine 01/08/2017  . Abnormal glucose tolerance in pregnancy 12/25/2016  . Pelvic pain in pregnancy, antepartum, third trimester 12/21/2016  . Headache in pregnancy 11/27/2016  . H/O preterm delivery, currently pregnant, third trimester 09/01/2016  . Late prenatal care affecting pregnancy in second trimester 09/01/2016  . Obesity (BMI 30-39.9) 08/30/2016  . Maternal varicella, non-immune 08/30/2016    1. Preop examination   2. Complex ovarian cyst   3. Pelvic pain in female   4. Elevated CA-125      Plan:   Orders: No orders of the defined types were placed in this encounter.    1.  Exploratory laparoscopy -left ovarian cystectomy -possible oophorectomy -possible lymph node dissection.   I have previously discussed this case with Dr. Johnnette Litter and the case will be scheduled together.  Elonda Husky, M.D. 01/31/2021 8:27 AM

## 2021-01-31 NOTE — Progress Notes (Signed)
PRE-OPERATIVE HISTORY AND PHYSICAL EXAM  PCP:  [facility] Subjective:   HPI:  Mandy Romero is a 25 y.o. [phone].  Patient's last menstrual period was 01/18/2021.  She presents today for a pre-op discussion and PE.  She has the following symptoms: Pelvic pain-complex left ovarian cyst-elevated CA-125.  Review of Systems:   Constitutional: Denied constitutional symptoms, night sweats, recent illness, fatigue, fever, insomnia and weight loss.  Eyes: Denied eye symptoms, eye pain, photophobia, vision change and visual disturbance.  Ears/Nose/Throat/Neck: Denied ear, nose, throat or neck symptoms, hearing loss, nasal discharge, sinus congestion and sore throat.  Cardiovascular: Denied cardiovascular symptoms, arrhythmia, chest pain/pressure, edema, exercise intolerance, orthopnea and palpitations.  Respiratory: Denied pulmonary symptoms, asthma, pleuritic pain, productive sputum, cough, dyspnea and wheezing.  Gastrointestinal: Denied, gastro-esophageal reflux, melena, nausea and vomiting.  Genitourinary: See HPI for additional information.  Musculoskeletal: Denied musculoskeletal symptoms, stiffness, swelling, muscle weakness and myalgia.  Dermatologic: Denied dermatology symptoms, rash and scar.  Neurologic: Denied neurology symptoms, dizziness, headache, neck pain and syncope.  Psychiatric: Denied psychiatric symptoms, anxiety and depression.  Endocrine: Denied endocrine symptoms including hot flashes and night sweats.   OB History  Gravida Para Term Preterm AB Living  3 3 1 2   3   SAB IAB Ectopic Multiple Live Births        0 3    # Outcome Date GA Lbr Len/2nd Weight Sex Delivery Anes PTL Lv  3 Term 01/28/17 [redacted]w[redacted]d  6 lb 15.5 oz (3.16 kg) M Vag-Spont Other  LIV  2 Preterm 2016 [redacted]w[redacted]d  6 lb 12 oz (3.062 kg) M Vag-Spont None  LIV  1 Preterm 2013 [redacted]w[redacted]d  6 lb 11 oz (3.033 kg) M Vag-Spont None  LIV    Past Medical History:  Diagnosis Date  .  Migraine   . Ovarian cyst   . Ovarian cyst   . Peritonsillar abscess   . Preterm labor     Past Surgical History:  Procedure Laterality Date  . CHOLECYSTECTOMY    . LAPAROSCOPIC LYSIS OF ADHESIONS  07/30/2020   Procedure: LAPAROSCOPIC LYSIS OF PELVIC ADHESIONS;  Surgeon: 09/29/2020, MD;  Location: [facility];  Service: Gynecology;;  . LAPAROSCOPIC OVARIAN CYSTECTOMY Left 07/30/2020   Procedure: LAPAROSCOPIC OVARIAN CYSTECTOMY;  Surgeon: 09/29/2020, MD;  Location: [facility];  Service: Gynecology;  Laterality: Left;      SOCIAL HISTORY: Social History   Tobacco Use  Smoking Status Never Smoker  Smokeless Tobacco Never Used   Social History   Substance and Sexual Activity  Alcohol Use Yes   Comment: rare   Social History   Substance and Sexual Activity  Drug Use Not on file    Family History  Problem Relation Age of Onset  . Diabetes Father   . Hypertension Father   . Cancer Maternal Aunt   . Gestational diabetes Sister     ALLERGIES:  Patient has no known allergies.  MEDS:   Current Outpatient Medications on File Prior to Visit  Medication Sig Dispense Refill  . Norethindrone-Ethinyl Estradiol-Fe Biphas (LO LOESTRIN FE) 1 MG-10 MCG / 10 MCG tablet Take 1 tablet by mouth at bedtime. Begin pills on 1st day of menses. 28 tablet 2   No current facility-administered medications on file prior to visit.    No orders of the defined types were placed in this encounter.    Physical examination BP 106/72   Pulse 66   Ht  5' (1.524 m)   Wt 195 lb (88.5 kg)   LMP 01/18/2021   BMI 38.08 kg/m   General NAD, Conversant  HEENT Atraumatic; Op clear with mmm.  Normo-cephalic. Pupils reactive. Anicteric sclerae  Thyroid/Neck Smooth without nodularity or enlargement. Normal ROM.  Neck Supple.  Skin No rashes, lesions or ulceration. Normal palpated skin turgor. No nodularity.  Breasts: No masses or discharge.  Symmetric.  No axillary adenopathy.  Lungs:  Clear to auscultation.No rales or wheezes. Normal Respiratory effort, no retractions.  Heart: NSR.  No murmurs or rubs appreciated. No periferal edema  Abdomen: Soft.  Non-tender.  No masses.  No HSM. No hernia  Extremities: Moves all appropriately.  Normal ROM for age. No lymphadenopathy.  Neuro: Oriented to PPT.  Normal mood. Normal affect.     Pelvic:  Deferred to OR see ultrasound below    [place]: FINDINGS: Uterus  Measurements: 9.1 x 4.6 x 5.2 cm = volume: 112 mL. No fibroids or other mass visualized.  Endometrium  Thickness: 8 mm.  No focal abnormality visualized.  Right ovary  Measurements: 3.7 x 2.3 x 1.7 cm = volume: 7.9 mL. Normal appearance/no adnexal mass.  Left ovary  Measurements: 7.4 x 4.9 by 4.5 cm = volume: 85.5 mL. There is a left adnexal mass. This mass has changed in appearance over time. In June of 2021, there was a large hyperechoic portion of this mass and an endometrioma would have been been favored. By July 21, 2020, the mass was more cystic in appearance and a hemorrhagic cyst was favored. Today, the mass is largely solid in appearance with a small anterior cystic component. There is arterial and venous blood flow within the periphery of this mass. The mass is clearly larger in appearance when compared to June of 2021.  Pulsed Doppler evaluation of both ovaries demonstrates normal low-resistance arterial and venous waveforms.  Other findings  There is a small amount of complicated fluid in the pelvis.  IMPRESSION: 1. There is a complex solid and cystic left adnexal mass, likely arising from the left ovary, which has evolved over time as above. Differential considerations would include neoplasm and endometrioma. A dermoid is less likely. A hemorrhagic cyst is less likely. Recommend gynecologic consultation. An MRI may better characterize this mass. 2. No other abnormalities.     Assessment:   [id number] Patient Active Problem  List   Diagnosis Date Noted  . Ovarian cyst 07/30/2020  . Pelvic adhesions 07/30/2020  . Pelvic pain in female 07/30/2020  . Vaginal bleeding in pregnancy, third trimester 01/23/2017  . Migraine 01/08/2017  . Abnormal glucose tolerance in pregnancy 12/25/2016  . Pelvic pain in pregnancy, antepartum, third trimester 12/21/2016  . Headache in pregnancy 11/27/2016  . H/O preterm delivery, currently pregnant, third trimester 09/01/2016  . Late prenatal care affecting pregnancy in second trimester 09/01/2016  . Obesity (BMI 30-39.9) 08/30/2016  . Maternal varicella, non-immune 08/30/2016    1. Preop examination   2. Complex ovarian cyst   3. Pelvic pain in female   4. Elevated CA-125      Plan:   Orders: No orders of the defined types were placed in this encounter.    1.  Exploratory laparoscopy -left ovarian cystectomy -possible oophorectomy -possible lymph node dissection.  Pre-op discussions regarding Risks and Benefits of her scheduled surgery.  Exp Lyp Mass We have discussed the procedure of Exploratory Laparoscopy in detail.  She is aware that her pain is most likely from her pelvic mass.  Medical management and expectant management of pelvic pain and pelvic masses has also been discussed and the patient has decided that this option is not satisfactory for her.  She has elected to have a Laparoscopy performed to attempt to diagnose and manage her pain and mass.  She has been informed that Oophorectomy may be necessary and that a small decrease in fertility may result.  I have informed her that Laparoscopy, like other surgical procedures, entails the following risks:  bleeding, infection, damage to bowel, bladder or other internal organ, and the risk or anesthesia.  She is aware that her risks are not limited to these.  We have discussed the possibility of Endometriosis and Pelvic Adhesive Disease.  We have discussed surgical as well as subsequent medical management of these  conditions.  The patient is aware that both of these conditions can often be diagnosed and treated via Laparoscopy.  We have discussed the possibility  of recurrence and that despite adequate treatment at this time, pelvic pain may reoccur in the future.   I have answered all of her questions and I believe she has been well informed regarding the risks/benefits of Exploratory Laparoscopy for pelvic pain and pelvic mass.  We have also discussed the small chance of ovarian cancer, especially in light of her elevated CA-125.  She is aware that Dr. Johnnette Litter will be at the surgery and any additional surgical requirements based on the diagnosis of cancer will be performed in conjunction with him.  All of her questions were answered.  I spent 32 minutes involved in the care of this patient preparing to see the patient by obtaining and reviewing her medical history (including labs, imaging tests and prior procedures), documenting clinical information in the electronic health record (EHR), counseling and coordinating care plans, writing and sending prescriptions, ordering tests or procedures and directly communicating with the patient by discussing pertinent items from her history and physical exam as well as detailing my assessment and plan as noted above so that she has an informed understanding.  All of her questions were answered.  Elonda Husky, M.D. 01/31/2021 8:27 AM

## 2021-01-31 NOTE — H&P (Signed)
PRE-OPERATIVE HISTORY AND PHYSICAL EXAM  PCP:  [facility] Subjective:   HPI:  Mandy Romero is a 25 y.o. [phone].  Patient's last menstrual period was 01/18/2021.  She presents today for a pre-op discussion and PE.  She has the following symptoms: Pelvic pain-complex left ovarian cyst-elevated CA-125.  Review of Systems:   Constitutional: Denied constitutional symptoms, night sweats, recent illness, fatigue, fever, insomnia and weight loss.  Eyes: Denied eye symptoms, eye pain, photophobia, vision change and visual disturbance.  Ears/Nose/Throat/Neck: Denied ear, nose, throat or neck symptoms, hearing loss, nasal discharge, sinus congestion and sore throat.  Cardiovascular: Denied cardiovascular symptoms, arrhythmia, chest pain/pressure, edema, exercise intolerance, orthopnea and palpitations.  Respiratory: Denied pulmonary symptoms, asthma, pleuritic pain, productive sputum, cough, dyspnea and wheezing.  Gastrointestinal: Denied, gastro-esophageal reflux, melena, nausea and vomiting.  Genitourinary: See HPI for additional information.  Musculoskeletal: Denied musculoskeletal symptoms, stiffness, swelling, muscle weakness and myalgia.  Dermatologic: Denied dermatology symptoms, rash and scar.  Neurologic: Denied neurology symptoms, dizziness, headache, neck pain and syncope.  Psychiatric: Denied psychiatric symptoms, anxiety and depression.  Endocrine: Denied endocrine symptoms including hot flashes and night sweats.   OB History  Gravida Para Term Preterm AB Living  3 3 1 2   3   SAB IAB Ectopic Multiple Live Births        0 3    # Outcome Date GA Lbr Len/2nd Weight Sex Delivery Anes PTL Lv  3 Term 01/28/17 [redacted]w[redacted]d  6 lb 15.5 oz (3.16 kg) M Vag-Spont Other  LIV  2 Preterm 2016 [redacted]w[redacted]d  6 lb 12 oz (3.062 kg) M Vag-Spont None  LIV  1 Preterm 2013 [redacted]w[redacted]d  6 lb 11 oz (3.033 kg) M Vag-Spont None  LIV    Past Medical History:  Diagnosis Date  .  Migraine   . Ovarian cyst   . Ovarian cyst   . Peritonsillar abscess   . Preterm labor     Past Surgical History:  Procedure Laterality Date  . CHOLECYSTECTOMY    . LAPAROSCOPIC LYSIS OF ADHESIONS  07/30/2020   Procedure: LAPAROSCOPIC LYSIS OF PELVIC ADHESIONS;  Surgeon: 09/29/2020, MD;  Location: [facility];  Service: Gynecology;;  . LAPAROSCOPIC OVARIAN CYSTECTOMY Left 07/30/2020   Procedure: LAPAROSCOPIC OVARIAN CYSTECTOMY;  Surgeon: 09/29/2020, MD;  Location: [facility];  Service: Gynecology;  Laterality: Left;      SOCIAL HISTORY: Social History   Tobacco Use  Smoking Status Never Smoker  Smokeless Tobacco Never Used   Social History   Substance and Sexual Activity  Alcohol Use Yes   Comment: rare   Social History   Substance and Sexual Activity  Drug Use Not on file    Family History  Problem Relation Age of Onset  . Diabetes Father   . Hypertension Father   . Cancer Maternal Aunt   . Gestational diabetes Sister     ALLERGIES:  Patient has no known allergies.  MEDS:   Current Outpatient Medications on File Prior to Visit  Medication Sig Dispense Refill  . Norethindrone-Ethinyl Estradiol-Fe Biphas (LO LOESTRIN FE) 1 MG-10 MCG / 10 MCG tablet Take 1 tablet by mouth at bedtime. Begin pills on 1st day of menses. 28 tablet 2   No current facility-administered medications on file prior to visit.    No orders of the defined types were placed in this encounter.    Physical examination BP 106/72   Pulse 66   Ht  5' (1.524 m)   Wt 195 lb (88.5 kg)   LMP 01/18/2021   BMI 38.08 kg/m   General NAD, Conversant  HEENT Atraumatic; Op clear with mmm.  Normo-cephalic. Pupils reactive. Anicteric sclerae  Thyroid/Neck Smooth without nodularity or enlargement. Normal ROM.  Neck Supple.  Skin No rashes, lesions or ulceration. Normal palpated skin turgor. No nodularity.  Breasts: No masses or discharge.  Symmetric.  No axillary adenopathy.  Lungs:  Clear to auscultation.No rales or wheezes. Normal Respiratory effort, no retractions.  Heart: NSR.  No murmurs or rubs appreciated. No periferal edema  Abdomen: Soft.  Non-tender.  No masses.  No HSM. No hernia  Extremities: Moves all appropriately.  Normal ROM for age. No lymphadenopathy.  Neuro: Oriented to PPT.  Normal mood. Normal affect.     Pelvic:  Deferred to OR see ultrasound below    [place]: FINDINGS: Uterus  Measurements: 9.1 x 4.6 x 5.2 cm = volume: 112 mL. No fibroids or other mass visualized.  Endometrium  Thickness: 8 mm.  No focal abnormality visualized.  Right ovary  Measurements: 3.7 x 2.3 x 1.7 cm = volume: 7.9 mL. Normal appearance/no adnexal mass.  Left ovary  Measurements: 7.4 x 4.9 by 4.5 cm = volume: 85.5 mL. There is a left adnexal mass. This mass has changed in appearance over time. In June of 2021, there was a large hyperechoic portion of this mass and an endometrioma would have been been favored. By July 21, 2020, the mass was more cystic in appearance and a hemorrhagic cyst was favored. Today, the mass is largely solid in appearance with a small anterior cystic component. There is arterial and venous blood flow within the periphery of this mass. The mass is clearly larger in appearance when compared to June of 2021.  Pulsed Doppler evaluation of both ovaries demonstrates normal low-resistance arterial and venous waveforms.  Other findings  There is a small amount of complicated fluid in the pelvis.  IMPRESSION: 1. There is a complex solid and cystic left adnexal mass, likely arising from the left ovary, which has evolved over time as above. Differential considerations would include neoplasm and endometrioma. A dermoid is less likely. A hemorrhagic cyst is less likely. Recommend gynecologic consultation. An MRI may better characterize this mass. 2. No other abnormalities.     Assessment:   [id number] Patient Active Problem  List   Diagnosis Date Noted  . Ovarian cyst 07/30/2020  . Pelvic adhesions 07/30/2020  . Pelvic pain in female 07/30/2020  . Vaginal bleeding in pregnancy, third trimester 01/23/2017  . Migraine 01/08/2017  . Abnormal glucose tolerance in pregnancy 12/25/2016  . Pelvic pain in pregnancy, antepartum, third trimester 12/21/2016  . Headache in pregnancy 11/27/2016  . H/O preterm delivery, currently pregnant, third trimester 09/01/2016  . Late prenatal care affecting pregnancy in second trimester 09/01/2016  . Obesity (BMI 30-39.9) 08/30/2016  . Maternal varicella, non-immune 08/30/2016    1. Preop examination   2. Complex ovarian cyst   3. Pelvic pain in female   4. Elevated CA-125      Plan:   Orders: No orders of the defined types were placed in this encounter.    1.  Exploratory laparoscopy -left ovarian cystectomy -possible oophorectomy -possible lymph node dissection.   I have previously discussed this case with Dr. Johnnette Litter and the case will be scheduled together.  Elonda Husky, M.D. 01/31/2021 8:27 AM

## 2021-02-05 ENCOUNTER — Other Ambulatory Visit: Payer: Self-pay | Admitting: Obstetrics and Gynecology

## 2021-02-05 ENCOUNTER — Telehealth: Payer: Self-pay | Admitting: Obstetrics and Gynecology

## 2021-02-05 NOTE — Telephone Encounter (Signed)
Pt called requesting pain medication- states that he is in constant pain and sx is not until May. Pt has been attempting to treat with ibuprofen. Please Advise.

## 2021-02-06 ENCOUNTER — Telehealth: Payer: Self-pay | Admitting: Obstetrics and Gynecology

## 2021-02-06 NOTE — Telephone Encounter (Signed)
Please see previous message

## 2021-02-06 NOTE — Telephone Encounter (Signed)
Spoke with patient and she has not had anything since she got out of the hospital. Patient stated that she is having a lot of pain.

## 2021-02-06 NOTE — Telephone Encounter (Signed)
Patient called again asking about pain medication- I made her aware nurse was communicating with Dr.Evans about her situation and I can send message.

## 2021-02-07 ENCOUNTER — Other Ambulatory Visit: Payer: Self-pay | Admitting: Obstetrics and Gynecology

## 2021-02-07 DIAGNOSIS — R102 Pelvic and perineal pain: Secondary | ICD-10-CM

## 2021-02-07 MED ORDER — TRAMADOL HCL 50 MG PO TABS: 50.0000 mg | ORAL_TABLET | Freq: Four times a day (QID) | ORAL | 0 refills | Status: DC | PRN

## 2021-02-11 ENCOUNTER — Other Ambulatory Visit
Admission: RE | Admit: 2021-02-11 | Discharge: 2021-02-11 | Disposition: A | Discharge status: Home / Self Care | Payer: Medicaid Other | Source: Ambulatory Visit | Attending: Obstetrics and Gynecology | Admitting: Obstetrics and Gynecology

## 2021-02-11 ENCOUNTER — Other Ambulatory Visit: Payer: Self-pay

## 2021-02-11 NOTE — Patient Instructions (Signed)
Your procedure is scheduled on: Wednesday Feb 20, 2021. Report to [facility] inside [facility] 2nd floor (stop by admissions desk before getting on elevator). To find out your arrival time please call [phone] between 1PM - 3PM on Tuesday Feb 19, 2021.  Remember: Instructions that are not followed completely may result in serious medical risk,  up to and including death, or upon the discretion of your surgeon and anesthesiologist your  surgery may need to be rescheduled.     _X__ 1. Do not eat food after midnight the night before your procedure.                 No chewing gum or hard candies. You may drink clear liquids up to 2 hours                 before you are scheduled to arrive for your surgery- DO not drink clear                 liquids within 2 hours of the start of your surgery.                 Clear Liquids include:  water, apple juice without pulp, clear Gatorade, G2 or                  Gatorade Zero (avoid Red/Purple/Blue), Black Coffee or Tea (Do not add                 anything to coffee or tea).  __X__2.  On the morning of surgery brush your teeth with toothpaste and water, you                may rinse your mouth with mouthwash if you wish.  Do not swallow any toothpaste of mouthwash.     _X__ 3.  No Alcohol for 24 hours before or after surgery.   _X__ 4.  Do Not Smoke or use e-cigarettes For 24 Hours Prior to Your Surgery.                 Do not use any chewable tobacco products for at least 6 hours prior to                 Surgery.  _X__  5.  Do not use any recreational drugs (marijuana, cocaine, heroin, ecstasy, MDMA or other)                For at least one week prior to your surgery.  Combination of these drugs with anesthesia                May have life threatening results.   __X__6.  Notify your doctor if there is any change in your medical condition      (cold, fever, infections).     Do not wear jewelry, make-up, hairpins, clips  or nail polish. Do not wear lotions, powders, or perfumes. You may wear deodorant. Do not shave 48 hours prior to surgery.  Do not bring valuables to the hospital.    [facility] is not responsible for any belongings or valuables.  Contacts, dentures or bridgework may not be worn into surgery. Leave your suitcase in the car. After surgery it may be brought to your room. For patients admitted to the hospital, discharge time is determined by your treatment team.   Patients discharged the day of surgery will not be allowed to drive home.   Make arrangements for  someone to be with you for the first 24 hours of your Same Day Discharge.   __X__ Take these medicines the morning of surgery with A SIP OF WATER:    1. None   2.   3.   4.  5.  6.  ____ Fleet Enema (as directed)   __X__ Use CHG Soap (or wipes) as directed  ____ Use Benzoyl Peroxide Gel as instructed  ____ Use inhalers on the day of surgery  ____ Stop metformin 2 days prior to surgery    ____ Take 1/2 of usual insulin dose the night before surgery. No insulin the morning          of surgery.   ____ Call your PCP, cardiologist, or Pulmonologist if taking Coumadin/Plavix/aspirin and ask when to stop before your surgery.   __X__ One Week prior to surgery- Stop Anti-inflammatories such as Ibuprofen, Aleve, Advil, Motrin, meloxicam (MOBIC), diclofenac, etodolac, ketorolac, Toradol, Daypro, piroxicam, Goody's or BC powders. OK TO USE TYLENOL IF NEEDED   __x__ Stop supplements until after surgery.    ____ Bring C-Pap to the hospital.    If you have any questions regarding your pre-procedure instructions,  Please call Pre-admit Testing at [phone]

## 2021-02-18 ENCOUNTER — Other Ambulatory Visit: Payer: Medicaid Other

## 2021-02-18 ENCOUNTER — Other Ambulatory Visit: Payer: Self-pay

## 2021-02-18 ENCOUNTER — Other Ambulatory Visit
Admission: RE | Admit: 2021-02-18 | Discharge: 2021-02-18 | Disposition: A | Discharge status: Home / Self Care | Payer: Medicaid Other | Source: Ambulatory Visit | Attending: Obstetrics and Gynecology | Admitting: Obstetrics and Gynecology

## 2021-02-18 DIAGNOSIS — Z01812 Encounter for preprocedural laboratory examination: Secondary | ICD-10-CM | POA: Diagnosis not present

## 2021-02-18 DIAGNOSIS — Z20822 Contact with and (suspected) exposure to covid-19: Secondary | ICD-10-CM | POA: Insufficient documentation

## 2021-02-18 LAB — CBC
HCT: 40.5 % (ref 36.0–46.0)
Hemoglobin: 13.8 g/dL (ref 12.0–15.0)
MCH: 29.5 pg (ref 26.0–34.0)
MCHC: 34.1 g/dL (ref 30.0–36.0)
MCV: 86.5 fL (ref 80.0–100.0)
Platelets: 243 10*3/uL (ref 150–400)
RBC: 4.68 MIL/uL (ref 3.87–5.11)
RDW: 12.8 % (ref 11.5–15.5)
WBC: 7.1 10*3/uL (ref 4.0–10.5)
nRBC: 0 % (ref 0.0–0.2)

## 2021-02-18 LAB — TYPE AND SCREEN
ABO/RH(D): O POS
Antibody Screen: NEGATIVE

## 2021-02-19 LAB — SARS CORONAVIRUS 2 (TAT 6-24 HRS): SARS Coronavirus 2: NEGATIVE

## 2021-02-20 ENCOUNTER — Ambulatory Visit: Payer: Medicaid Other | Admitting: Anesthesiology

## 2021-02-20 ENCOUNTER — Encounter
Admission: RE | Disposition: A | Discharge status: Home / Self Care | Payer: Self-pay | Source: Home / Self Care | Attending: Obstetrics and Gynecology

## 2021-02-20 ENCOUNTER — Other Ambulatory Visit: Payer: Self-pay

## 2021-02-20 ENCOUNTER — Ambulatory Visit
Admission: RE | Admit: 2021-02-20 | Discharge: 2021-02-20 | Disposition: A | Discharge status: Home / Self Care | Payer: Medicaid Other | Attending: Obstetrics and Gynecology | Admitting: Obstetrics and Gynecology

## 2021-02-20 ENCOUNTER — Encounter: Payer: Self-pay | Admitting: Obstetrics and Gynecology

## 2021-02-20 DIAGNOSIS — N801 Endometriosis of ovary: Secondary | ICD-10-CM | POA: Diagnosis not present

## 2021-02-20 DIAGNOSIS — R971 Elevated cancer antigen 125 [CA 125]: Secondary | ICD-10-CM | POA: Insufficient documentation

## 2021-02-20 DIAGNOSIS — R102 Pelvic and perineal pain: Secondary | ICD-10-CM | POA: Diagnosis not present

## 2021-02-20 DIAGNOSIS — Z8249 Family history of ischemic heart disease and other diseases of the circulatory system: Secondary | ICD-10-CM | POA: Diagnosis not present

## 2021-02-20 DIAGNOSIS — Z809 Family history of malignant neoplasm, unspecified: Secondary | ICD-10-CM | POA: Diagnosis not present

## 2021-02-20 DIAGNOSIS — Z833 Family history of diabetes mellitus: Secondary | ICD-10-CM | POA: Diagnosis not present

## 2021-02-20 DIAGNOSIS — N802 Endometriosis of fallopian tube: Secondary | ICD-10-CM | POA: Diagnosis not present

## 2021-02-20 DIAGNOSIS — Z79899 Other long term (current) drug therapy: Secondary | ICD-10-CM | POA: Diagnosis not present

## 2021-02-20 DIAGNOSIS — N83292 Other ovarian cyst, left side: Secondary | ICD-10-CM | POA: Diagnosis present

## 2021-02-20 DIAGNOSIS — N736 Female pelvic peritoneal adhesions (postinfective): Secondary | ICD-10-CM | POA: Insufficient documentation

## 2021-02-20 DIAGNOSIS — K668 Other specified disorders of peritoneum: Secondary | ICD-10-CM | POA: Diagnosis not present

## 2021-02-20 HISTORY — PX: LAPAROSCOPIC OVARIAN CYSTECTOMY: SHX6248

## 2021-02-20 HISTORY — PX: LAPAROSCOPY: SHX197

## 2021-02-20 LAB — POCT PREGNANCY, URINE: Preg Test, Ur: NEGATIVE

## 2021-02-20 SURGERY — OOPHORECTOMY, LAPAROSCOPIC
Anesthesia: General

## 2021-02-20 MED ORDER — OXYCODONE HCL 5 MG PO TABS
ORAL_TABLET | ORAL | Status: AC
  Filled 2021-02-20: qty 1

## 2021-02-20 MED ORDER — DEXAMETHASONE SODIUM PHOSPHATE 10 MG/ML IJ SOLN
INTRAMUSCULAR | Status: AC
  Filled 2021-02-20: qty 1

## 2021-02-20 MED ORDER — OXYCODONE-ACETAMINOPHEN 5-325 MG PO TABS: 1.0000 | ORAL_TABLET | ORAL | Status: DC | PRN

## 2021-02-20 MED ORDER — ORAL CARE MOUTH RINSE: 15.0000 mL | Freq: Once | OROMUCOSAL | Status: AC

## 2021-02-20 MED ORDER — PROPOFOL 10 MG/ML IV BOLUS
INTRAVENOUS | Status: AC
  Filled 2021-02-20: qty 20

## 2021-02-20 MED ORDER — ONDANSETRON HCL 4 MG/2ML IJ SOLN
4.0000 mg | Freq: Four times a day (QID) | INTRAMUSCULAR | Status: DC | PRN
Start: 2021-02-20 — End: 2021-02-20

## 2021-02-20 MED ORDER — DEXMEDETOMIDINE (PRECEDEX) IN NS 20 MCG/5ML (4 MCG/ML) IV SYRINGE
PREFILLED_SYRINGE | INTRAVENOUS | Status: AC
  Filled 2021-02-20: qty 5

## 2021-02-20 MED ORDER — ROCURONIUM BROMIDE 10 MG/ML (PF) SYRINGE
PREFILLED_SYRINGE | INTRAVENOUS | Status: AC
  Filled 2021-02-20: qty 10

## 2021-02-20 MED ORDER — FENTANYL CITRATE (PF) 100 MCG/2ML IJ SOLN
INTRAMUSCULAR | Status: DC | PRN
  Administered 2021-02-20 (×2): 50 ug via INTRAVENOUS

## 2021-02-20 MED ORDER — ACETAMINOPHEN 10 MG/ML IV SOLN
INTRAVENOUS | Status: DC | PRN
  Administered 2021-02-20: 1000 mg via INTRAVENOUS

## 2021-02-20 MED ORDER — LACTATED RINGERS IV SOLN: INTRAVENOUS | Status: DC

## 2021-02-20 MED ORDER — ONDANSETRON HCL 4 MG/2ML IJ SOLN
INTRAMUSCULAR | Status: DC | PRN
  Administered 2021-02-20: 4 mg via INTRAVENOUS

## 2021-02-20 MED ORDER — ACETAMINOPHEN 10 MG/ML IV SOLN
INTRAVENOUS | Status: AC
  Filled 2021-02-20: qty 100

## 2021-02-20 MED ORDER — ROCURONIUM BROMIDE 100 MG/10ML IV SOLN
INTRAVENOUS | Status: DC | PRN
  Administered 2021-02-20: 20 mg via INTRAVENOUS
  Administered 2021-02-20: 50 mg via INTRAVENOUS

## 2021-02-20 MED ORDER — LIDOCAINE HCL (PF) 2 % IJ SOLN
INTRAMUSCULAR | Status: AC
  Filled 2021-02-20: qty 5

## 2021-02-20 MED ORDER — DEXAMETHASONE SODIUM PHOSPHATE 10 MG/ML IJ SOLN
INTRAMUSCULAR | Status: DC | PRN
  Administered 2021-02-20: 10 mg via INTRAVENOUS

## 2021-02-20 MED ORDER — MIDAZOLAM HCL 2 MG/2ML IJ SOLN
INTRAMUSCULAR | Status: AC
  Filled 2021-02-20: qty 2

## 2021-02-20 MED ORDER — BUPIVACAINE HCL (PF) 0.5 % IJ SOLN
INTRAMUSCULAR | Status: DC | PRN
  Administered 2021-02-20: 20 mL

## 2021-02-20 MED ORDER — ONDANSETRON 4 MG PO TBDP: 4.0000 mg | ORAL_TABLET | Freq: Four times a day (QID) | ORAL | Status: DC | PRN

## 2021-02-20 MED ORDER — KETOROLAC TROMETHAMINE 30 MG/ML IJ SOLN
30.0000 mg | Freq: Once | INTRAMUSCULAR | Status: AC
  Administered 2021-02-20: 30 mg via INTRAVENOUS

## 2021-02-20 MED ORDER — POVIDONE-IODINE 10 % EX SWAB: 2.0000 "application " | Freq: Once | CUTANEOUS | Status: DC

## 2021-02-20 MED ORDER — MIDAZOLAM HCL 2 MG/2ML IJ SOLN
INTRAMUSCULAR | Status: DC | PRN
  Administered 2021-02-20: 2 mg via INTRAVENOUS

## 2021-02-20 MED ORDER — FENTANYL CITRATE (PF) 100 MCG/2ML IJ SOLN
25.0000 ug | INTRAMUSCULAR | Status: DC | PRN
  Administered 2021-02-20: 25 ug via INTRAVENOUS
  Administered 2021-02-20: 50 ug via INTRAVENOUS

## 2021-02-20 MED ORDER — DEXMEDETOMIDINE (PRECEDEX) IN NS 20 MCG/5ML (4 MCG/ML) IV SYRINGE
PREFILLED_SYRINGE | INTRAVENOUS | Status: DC | PRN
  Administered 2021-02-20 (×2): 8 ug via INTRAVENOUS

## 2021-02-20 MED ORDER — ONDANSETRON HCL 4 MG/2ML IJ SOLN
INTRAMUSCULAR | Status: AC
  Filled 2021-02-20: qty 2

## 2021-02-20 MED ORDER — FAMOTIDINE 20 MG PO TABS
ORAL_TABLET | ORAL | Status: AC
  Filled 2021-02-20: qty 1

## 2021-02-20 MED ORDER — OXYCODONE HCL 5 MG/5ML PO SOLN: 5.0000 mg | Freq: Once | ORAL | Status: AC | PRN

## 2021-02-20 MED ORDER — PROPOFOL 10 MG/ML IV BOLUS
INTRAVENOUS | Status: DC | PRN
  Administered 2021-02-20: 160 mg via INTRAVENOUS

## 2021-02-20 MED ORDER — EPHEDRINE SULFATE 50 MG/ML IJ SOLN
INTRAMUSCULAR | Status: DC | PRN
  Administered 2021-02-20: 10 mg via INTRAVENOUS
  Administered 2021-02-20: 15 mg via INTRAVENOUS

## 2021-02-20 MED ORDER — FAMOTIDINE 20 MG PO TABS
20.0000 mg | ORAL_TABLET | Freq: Once | ORAL | Status: AC
  Administered 2021-02-20: 20 mg via ORAL

## 2021-02-20 MED ORDER — CHLORHEXIDINE GLUCONATE 0.12 % MT SOLN: 15.0000 mL | Freq: Once | OROMUCOSAL | Status: AC

## 2021-02-20 MED ORDER — DEXTROSE IN LACTATED RINGERS 5 % IV SOLN: INTRAVENOUS | Status: DC

## 2021-02-20 MED ORDER — BUPIVACAINE HCL (PF) 0.5 % IJ SOLN
INTRAMUSCULAR | Status: AC
  Filled 2021-02-20: qty 30

## 2021-02-20 MED ORDER — FENTANYL CITRATE (PF) 100 MCG/2ML IJ SOLN
INTRAMUSCULAR | Status: AC
  Filled 2021-02-20: qty 2

## 2021-02-20 MED ORDER — CHLORHEXIDINE GLUCONATE 0.12 % MT SOLN
OROMUCOSAL | Status: AC
  Administered 2021-02-20: 15 mL via OROMUCOSAL
  Filled 2021-02-20: qty 15

## 2021-02-20 MED ORDER — EPHEDRINE 5 MG/ML INJ
INTRAVENOUS | Status: AC
  Filled 2021-02-20: qty 10

## 2021-02-20 MED ORDER — SUGAMMADEX SODIUM 200 MG/2ML IV SOLN
INTRAVENOUS | Status: DC | PRN
  Administered 2021-02-20: 200 mg via INTRAVENOUS

## 2021-02-20 MED ORDER — BUPIVACAINE-EPINEPHRINE (PF) 0.25% -1:200000 IJ SOLN
INTRAMUSCULAR | Status: AC
  Filled 2021-02-20: qty 30

## 2021-02-20 MED ORDER — GLYCOPYRROLATE 0.2 MG/ML IJ SOLN
INTRAMUSCULAR | Status: DC | PRN
  Administered 2021-02-20: .2 mg via INTRAVENOUS

## 2021-02-20 MED ORDER — GLYCOPYRROLATE 0.2 MG/ML IJ SOLN
INTRAMUSCULAR | Status: AC
  Filled 2021-02-20: qty 1

## 2021-02-20 MED ORDER — HYDROCODONE-ACETAMINOPHEN 5-325 MG PO TABS: 1.0000 | ORAL_TABLET | Freq: Four times a day (QID) | ORAL | 0 refills | Status: DC | PRN

## 2021-02-20 MED ORDER — KETOROLAC TROMETHAMINE 30 MG/ML IJ SOLN
INTRAMUSCULAR | Status: AC
  Filled 2021-02-20: qty 1

## 2021-02-20 MED ORDER — FENTANYL CITRATE (PF) 100 MCG/2ML IJ SOLN
INTRAMUSCULAR | Status: AC
  Administered 2021-02-20: 25 ug via INTRAVENOUS
  Filled 2021-02-20: qty 2

## 2021-02-20 MED ORDER — OXYCODONE HCL 5 MG PO TABS
5.0000 mg | ORAL_TABLET | Freq: Once | ORAL | Status: AC | PRN
  Administered 2021-02-20: 5 mg via ORAL

## 2021-02-20 MED ORDER — LIDOCAINE HCL (CARDIAC) PF 100 MG/5ML IV SOSY
PREFILLED_SYRINGE | INTRAVENOUS | Status: DC | PRN
  Administered 2021-02-20: 50 mg via INTRAVENOUS

## 2021-02-20 SURGICAL SUPPLY — 101 items
ADH SKN CLS APL DERMABOND .7 (GAUZE/BANDAGES/DRESSINGS) ×2
AGENT HMST MTR 8 SURGIFLO (HEMOSTASIS)
ANCHOR TIS RET SYS 235ML (MISCELLANEOUS) ×3 IMPLANT
APL ESCP 34 STRL LF DISP (HEMOSTASIS)
APL PRP STRL LF DISP 70% ISPRP (MISCELLANEOUS) ×2
APPLICATOR SURGIFLO ENDO (HEMOSTASIS) IMPLANT
BAG DRN RND TRDRP ANRFLXCHMBR (UROLOGICAL SUPPLIES)
BAG LAPAROSCOPIC 12 15 PORT 16 (BASKET) IMPLANT
BAG RETRIEVAL 12/15 (BASKET)
BAG TISS RTRVL C235 10X14 (MISCELLANEOUS) ×2
BAG URINE DRAIN 2000ML AR STRL (UROLOGICAL SUPPLIES) IMPLANT
BASIN GRAD PLASTIC 32OZ STRL (MISCELLANEOUS) ×3 IMPLANT
BLADE SURG 15 STRL LF DISP TIS (BLADE) ×2 IMPLANT
BLADE SURG 15 STRL SS (BLADE) ×3
CANISTER SUCT 1200ML W/VALVE (MISCELLANEOUS) ×3 IMPLANT
CANNULA DILATOR 5 W/SLV (CANNULA) IMPLANT
CATH FOLEY 2WAY  5CC 16FR (CATHETERS) ×1
CATH FOLEY 2WAY 5CC 16FR (CATHETERS) ×2
CATH URTH 16FR FL 2W BLN LF (CATHETERS) ×2 IMPLANT
CHLORAPREP W/TINT 26 (MISCELLANEOUS) ×3 IMPLANT
CLEANER CAUTERY TIP 5X5 PAD (MISCELLANEOUS) ×2 IMPLANT
CNTNR SPEC 2.5X3XGRAD LEK (MISCELLANEOUS) ×4
CONT SPEC 4OZ STER OR WHT (MISCELLANEOUS) ×2
CONT SPEC 4OZ STRL OR WHT (MISCELLANEOUS) ×4
CONTAINER SPEC 2.5X3XGRAD LEK (MISCELLANEOUS) ×4 IMPLANT
CORD MONOPOLAR M/FML 12FT (MISCELLANEOUS) ×3 IMPLANT
COVER WAND RF STERILE (DRAPES) ×3 IMPLANT
DEFOGGER SCOPE WARMER CLEARIFY (MISCELLANEOUS) ×3 IMPLANT
DERMABOND ADVANCED (GAUZE/BANDAGES/DRESSINGS) ×1
DERMABOND ADVANCED .7 DNX12 (GAUZE/BANDAGES/DRESSINGS) ×2 IMPLANT
DEVICE SUTURE ENDOST 10MM (ENDOMECHANICALS) IMPLANT
DEVICE TROCAR PUNCTURE CLOSURE (ENDOMECHANICALS) IMPLANT
DRAPE STERI POUCH LG 24X46 STR (DRAPES) IMPLANT
DRSG TELFA 4X3 1S NADH ST (GAUZE/BANDAGES/DRESSINGS) ×3 IMPLANT
ELECT REM PT RETURN 9FT ADLT (ELECTROSURGICAL) ×3
ELECTRODE REM PT RTRN 9FT ADLT (ELECTROSURGICAL) ×2 IMPLANT
GLOVE SURG ENC MOIS LTX SZ8 (GLOVE) ×12 IMPLANT
GLOVE SURG UNDER LTX SZ8 (GLOVE) ×3 IMPLANT
GOWN STRL REUS W/ TWL LRG LVL3 (GOWN DISPOSABLE) ×4 IMPLANT
GOWN STRL REUS W/ TWL XL LVL3 (GOWN DISPOSABLE) ×2 IMPLANT
GOWN STRL REUS W/TWL LRG LVL3 (GOWN DISPOSABLE) ×6
GOWN STRL REUS W/TWL XL LVL3 (GOWN DISPOSABLE) ×3
GRASPER SUT TROCAR 14GX15 (MISCELLANEOUS) ×3 IMPLANT
IRRIGATION STRYKERFLOW (MISCELLANEOUS) ×2 IMPLANT
IRRIGATOR STRYKERFLOW (MISCELLANEOUS) ×3
IV LACTATED RINGERS 1000ML (IV SOLUTION) ×3 IMPLANT
IV NS 1000ML (IV SOLUTION)
IV NS 1000ML BAXH (IV SOLUTION) IMPLANT
KIT IMAGING PINPOINTPAQ (MISCELLANEOUS) IMPLANT
KIT PINK PAD W/HEAD ARE REST (MISCELLANEOUS) ×3
KIT PINK PAD W/HEAD ARM REST (MISCELLANEOUS) ×2 IMPLANT
L-HOOK LAP DISP 36CM (ELECTROSURGICAL)
LABEL OR SOLS (LABEL) ×3 IMPLANT
LHOOK LAP DISP 36CM (ELECTROSURGICAL) IMPLANT
LIGASURE VESSEL 5MM BLUNT TIP (ELECTROSURGICAL) IMPLANT
MANIFOLD NEPTUNE II (INSTRUMENTS) ×3 IMPLANT
MANIPULATOR VCARE LG CRV RETR (MISCELLANEOUS) IMPLANT
MANIPULATOR VCARE SML CRV RETR (MISCELLANEOUS) IMPLANT
MANIPULATOR VCARE STD CRV RETR (MISCELLANEOUS) IMPLANT
NDL INSUFF ACCESS 14 VERSASTEP (NEEDLE) IMPLANT
NEEDLE HYPO 22GX1.5 SAFETY (NEEDLE) ×3 IMPLANT
NEEDLE SPNL 22GX5 LNG QUINC BK (NEEDLE) IMPLANT
NS IRRIG 500ML POUR BTL (IV SOLUTION) ×3 IMPLANT
OCCLUDER COLPOPNEUMO (BALLOONS) IMPLANT
PACK GYN LAPAROSCOPIC (MISCELLANEOUS) ×3 IMPLANT
PACK LAP CHOLECYSTECTOMY (MISCELLANEOUS) IMPLANT
PAD CLEANER CAUTERY TIP 5X5 (MISCELLANEOUS) ×1
PAD OB MATERNITY 4.3X12.25 (PERSONAL CARE ITEMS) ×3 IMPLANT
PAD PREP 24X41 OB/GYN DISP (PERSONAL CARE ITEMS) ×3 IMPLANT
PENCIL ELECTRO HAND CTR (MISCELLANEOUS) ×3 IMPLANT
POUCH ENDO CATCH II 15MM (MISCELLANEOUS) IMPLANT
SCISSORS METZENBAUM CVD 33 (INSTRUMENTS) IMPLANT
SET CYSTO W/LG BORE CLAMP LF (SET/KITS/TRAYS/PACK) IMPLANT
SET TUBE SMOKE EVAC HIGH FLOW (TUBING) IMPLANT
SHEARS ENDO 5MM 31CM (CUTTER) ×3 IMPLANT
SLEEVE ADV FIXATION 5X100MM (TROCAR) IMPLANT
SLEEVE ENDOPATH XCEL 5M (ENDOMECHANICALS) ×6 IMPLANT
SPOGE SURGIFLO 8M (HEMOSTASIS)
SPONGE LAP 18X18 RF (DISPOSABLE) ×3 IMPLANT
SPONGE LAP 4X18 RFD (DISPOSABLE) ×3 IMPLANT
SPONGE SURGIFLO 8M (HEMOSTASIS) IMPLANT
SUT ENDO VLOC 180-0-8IN (SUTURE) IMPLANT
SUT MNCRL 4-0 (SUTURE) ×3
SUT MNCRL 4-0 27XMFL (SUTURE) ×2
SUT MNCRL AB 4-0 PS2 18 (SUTURE) ×3 IMPLANT
SUT VIC AB 0 CT1 36 (SUTURE) ×3 IMPLANT
SUT VIC AB 2-0 UR6 27 (SUTURE) IMPLANT
SUT VICRYL 0 AB UR-6 (SUTURE) IMPLANT
SUTURE MNCRL 4-0 27XMF (SUTURE) ×2 IMPLANT
SYR 10ML LL (SYRINGE) ×3 IMPLANT
SYR 3ML LL SCALE MARK (SYRINGE) IMPLANT
SYR 50ML LL SCALE MARK (SYRINGE) ×6 IMPLANT
TRAP SPECIMEN MUCUS 40CC (MISCELLANEOUS) IMPLANT
TROCAR BLUNT TIP 12MM OMST12BT (TROCAR) IMPLANT
TROCAR ENDO BLADELESS 11MM (ENDOMECHANICALS) ×3 IMPLANT
TROCAR VERSASTEP PLUS 12MM (TROCAR) IMPLANT
TROCAR VERSASTEP PLUS 5MM (TROCAR) IMPLANT
TROCAR XCEL BLUNT TIP 100MML (ENDOMECHANICALS) IMPLANT
TROCAR XCEL NON-BLD 5MMX100MML (ENDOMECHANICALS) ×3 IMPLANT
TROCAR Z-THREAD OPTICAL 5X100M (TROCAR) IMPLANT
TUBING EVAC SMOKE HEATED PNEUM (TUBING) ×3 IMPLANT

## 2021-02-20 NOTE — Anesthesia Preprocedure Evaluation (Signed)
Anesthesia Evaluation  Patient identified by MRN, date of birth, ID band Patient awake    Reviewed: Allergy & Precautions, H&P , NPO status , Patient's Chart, lab work & pertinent test results  History of Anesthesia Complications Negative for: history of anesthetic complications  Airway Mallampati: III  TM Distance: <3 FB Neck ROM: full    Dental  (+) Chipped   Pulmonary neg pulmonary ROS, neg shortness of breath,    Pulmonary exam normal        Cardiovascular Exercise Tolerance: Good (-) angina(-) Past MI and (-) PND negative cardio ROS Normal cardiovascular exam     Neuro/Psych  Headaches, negative psych ROS   GI/Hepatic negative GI ROS, Neg liver ROS, neg GERD  ,  Endo/Other  negative endocrine ROS  Renal/GU      Musculoskeletal   Abdominal   Peds  Hematology negative hematology ROS (+)   Anesthesia Other Findings Past Medical History: No date: Migraine No date: Ovarian cyst No date: Ovarian cyst No date: Peritonsillar abscess No date: Preterm labor  Past Surgical History: 05/2012: CHOLECYSTECTOMY 07/30/2020: LAPAROSCOPIC LYSIS OF ADHESIONS     Comment:  Procedure: LAPAROSCOPIC LYSIS OF PELVIC ADHESIONS;                Surgeon: Linzie Collin, MD;  Location: [facility] ORS;                Service: Gynecology;; 07/30/2020: LAPAROSCOPIC OVARIAN CYSTECTOMY; Left     Comment:  Procedure: LAPAROSCOPIC OVARIAN CYSTECTOMY;  Surgeon:               Linzie Collin, MD;  Location: [facility] ORS;  Service:               Gynecology;  Laterality: Left;     Reproductive/Obstetrics negative OB ROS                             Anesthesia Physical Anesthesia Plan  ASA: II  Anesthesia Plan: General ETT   Post-op Pain Management:    Induction: Intravenous  PONV Risk Score and Plan: Ondansetron, Dexamethasone, Midazolam and Treatment may vary due to age or medical condition  Airway  Management Planned: Oral ETT  Additional Equipment:   Intra-op Plan:   Post-operative Plan: Extubation in OR  Informed Consent: I have reviewed the patients History and Physical, chart, labs and discussed the procedure including the risks, benefits and alternatives for the proposed anesthesia with the patient or authorized representative who has indicated his/her understanding and acceptance.     Dental Advisory Given  Plan Discussed with: Anesthesiologist, CRNA and Surgeon  Anesthesia Plan Comments: (Patient consented for risks of anesthesia including but not limited to:  - adverse reactions to medications - damage to eyes, teeth, lips or other oral mucosa - nerve damage due to positioning  - sore throat or hoarseness - Damage to heart, brain, nerves, lungs, other parts of body or loss of life  Patient voiced understanding.)        Anesthesia Quick Evaluation

## 2021-02-20 NOTE — Interval H&P Note (Signed)
History and Physical Interval Note:  02/20/2021 8:14 AM  Atalya [facility]  has presented today for surgery, with the diagnosis of Complex Left Ovarian Cyst - elevates CA-125.  The various methods of treatment have been discussed with the patient and family. After consideration of risks, benefits and other options for treatment, the patient has consented to  Procedure(s): LAPAROSCOPIC OOPHORECTOMY (Left) LAPAROSCOPIC OVARIAN CYSTECTOMY (Left) LAPAROSCOPY DIAGNOSTIC (N/A) as a surgical intervention.  The patient's history has been reviewed, patient examined, no change in status, stable for surgery.  I have reviewed the patient's chart and labs.  Questions were answered to the patient's satisfaction.     Brennan Bailey

## 2021-02-20 NOTE — Anesthesia Procedure Notes (Signed)
Procedure Name: Intubation Performed by: Michial Disney, CRNA Pre-anesthesia Checklist: Patient identified, Patient being monitored, Timeout performed, Emergency Drugs available and Suction available Patient Re-evaluated:Patient Re-evaluated prior to induction Oxygen Delivery Method: Circle system utilized Preoxygenation: Pre-oxygenation with 100% oxygen Induction Type: IV induction Ventilation: Mask ventilation without difficulty Laryngoscope Size: 3 and McGraph Grade View: Grade I Tube type: Oral Tube size: 7.0 mm Number of attempts: 1 Airway Equipment and Method: Stylet and Video-laryngoscopy Placement Confirmation: ETT inserted through vocal cords under direct vision,  positive ETCO2 and breath sounds checked- equal and bilateral Secured at: 21 cm Tube secured with: Tape Dental Injury: Teeth and Oropharynx as per pre-operative assessment        

## 2021-02-20 NOTE — Anesthesia Postprocedure Evaluation (Signed)
Anesthesia Post Note  Patient: Mandy Romero  Procedure(s) Performed: LAPAROSCOPIC OOPHORECTOMY (Left ) LAPAROSCOPIC OVARIAN CYSTECTOMY (Left ) LAPAROSCOPY DIAGNOSTIC (N/A )  Patient location during evaluation: PACU Anesthesia Type: General Level of consciousness: awake and alert Pain management: pain level controlled Vital Signs Assessment: post-procedure vital signs reviewed and stable Respiratory status: spontaneous breathing, nonlabored ventilation, respiratory function stable and patient connected to nasal cannula oxygen Cardiovascular status: blood pressure returned to baseline and stable Postop Assessment: no apparent nausea or vomiting Anesthetic complications: no   No complications documented.   Last Vitals:  Vitals:   02/20/21 1130 02/20/21 1143  BP: 104/65 109/63  Pulse: 65 71  Resp: (!) 27 19  Temp: 36.6 C 36.6 C  SpO2: 94% 92%    Last Pain:  Vitals:   02/20/21 1143  TempSrc:   PainSc: 4                  Cleda Mccreedy Mandy Romero

## 2021-02-20 NOTE — Transfer of Care (Signed)
Immediate Anesthesia Transfer of Care Note  Patient: Mandy Romero  Procedure(s) Performed: LAPAROSCOPIC OOPHORECTOMY (Left ) LAPAROSCOPIC OVARIAN CYSTECTOMY (Left ) LAPAROSCOPY DIAGNOSTIC (N/A )  Patient Location: PACU  Anesthesia Type:General  Level of Consciousness: awake  Airway & Oxygen Therapy: Patient Spontanous Breathing and Patient connected to face mask oxygen  Post-op Assessment: Report given to RN and Post -op Vital signs reviewed and stable  Post vital signs: Reviewed  Last Vitals:  Vitals Value Taken Time  BP 115/66 02/20/21 1037  Temp    Pulse 95 02/20/21 1038  Resp 26 02/20/21 1038  SpO2 98 % 02/20/21 1038  Vitals shown include unvalidated device data.  Last Pain:  Vitals:   02/20/21 0727  TempSrc: Tympanic  PainSc: 0-No pain         Complications: No complications documented.

## 2021-02-20 NOTE — Discharge Instructions (Signed)

## 2021-02-20 NOTE — Op Note (Signed)
@LOGO @    OPERATIVE NOTE 02/20/2021 10:47 AM  PRE-OPERATIVE DIAGNOSIS:  1) Complex Left Ovarian Cyst - elevated CA-125  POST-OPERATIVE DIAGNOSIS:  1) left ovarian endometrioma 2) extensive pelvic adhesions 3) endometriosis  OPERATION:  LAPAROSCOPIC OOPHORECTOMY:  LAPAROSCOPIC OVARIAN CYSTECTOMY:  LAPAROSCOPY DIAGNOSTIC:   LYSIS OF PELVIC ADHESIONS  SURGEON(S): Surgeon(s) and Role:    04/22/2021, MD - Primary    Linzie Collin, MD - Assisting   No other capable assistant was available for this surgery which requires an experienced, high level assistant.  She provided exposure, dissection, suctioning, retraction, and general support and assistance during the procedure.   ANESTHESIA: Choice  ESTIMATED BLOOD LOSS: 50 mL  OPERATIVE FINDINGS: Complex left ovarian mass with dense adhesions to the left sidewall and cul-de-sac.       Frozen section reveals endometrioma      Numerous peritoneal endometriotic implants  SPECIMEN:  ID Type Source Tests Collected by Time Destination  1 : Left Fallopian Tube and Ovary Tissue PATH Gyn benign resection SURGICAL PATHOLOGY Leida Lauth, MD 02/20/2021 [phone]   2 : Left Peritoneal Cyst and Left Pelvic Adhesion Tissue PATH Gyn biopsy SURGICAL PATHOLOGY [id number], MD 02/20/2021 [phone]   3 : Left Pelvic Cyst  Tissue PATH Gyn biopsy SURGICAL PATHOLOGY [id number], MD 02/20/2021 1007     COMPLICATIONS: None  DISPOSITION: Stable to recovery room  DESCRIPTION OF PROCEDURE:      The patient was prepped and draped in the dorsolithotomy position and placed under general anesthesia. The bladder was emptied. The cervix was grasped with a multi-toothed tenaculum and a uterine manipulator was placed within the cervical os respecting the position and curvature of the uterus. After changing gloves we proceeded abdominally. A small infraumbilical incision was made and a 5 mm trocar port was placed within the abdominopelvic cavity. The  opening pressure was less than 7 mmHg.  Approximately 3 and 1/2 L of carbon dioxide gas was instilled within the abdominal pelvic cavity. The laparoscope was placed and the pelvis and abdomen were carefully inspected. Right and left lower quadrant ports were placed under direct visualization in the usual manner. The left tube and ovary were contained within the mass stuck to the left sidewall as well as the posterior cul-de-sac.  Using blunt and sharp dissection we systematically dissected the left tube and ovary from the cul-de-sac and from the left adnexa.  Endometriotic implants were noted throughout the pelvis.  Scarring of the peritoneum was also evident.  We carefully exposed the left infundibulopelvic ligament this was triply coagulated and divided.  We turned our attention to the adhesions in the cul-de-sac.  Elevating the uterus we were able to identify them and systematically lysed adhesions.  Care was taken to avoid the left sidewall staying well away from the ureter.  Many of these were dense and required careful dissection.  The utero-ovarian ligament was identified coagulated and divided.  The left fallopian tube was not specifically noted but appeared to be densely attached to the enlarged complex left ovary which was sent as the specimen.  During the process of lysis of adhesions a small hole was made in the left ovarian cystic mass and was productive of brown fluid which appeared to be consistent with an endometrioma.  Once the mass was freed from the cul-de-sac and its adhesions against the left sidewall it was placed in an Endo Catch bag and removed from the abdomen.  At this point a frozen  section was sent and findings were subsequently found as above.  Irrigation of the pelvis was performed all pedicles were checked and found to be hemostatic. The right tube and ovary appeared normal without significant endometriosis. Hemostasis of all areas of the pelvis was noted. The ports were removed  under direct visualization.  Hemostasis was again noted.  The left lower quadrant port was closed using the fascial closure device as a separate suture followed by a 4-0 Vicryl used to close the skin.  The laparoscope was removed, the gas was allowed to escape.  The remaining incisions were closed with a subcuticular suture and skin glue was then applied to all incisions.  A long-acting anesthetic was injected.  The uterine manipulator was removed. Hemostasis of the cervix was noted. The patient went to the recovery room in stable condition.  Elonda Husky, M.D. 02/20/2021 10:47 AM

## 2021-02-21 ENCOUNTER — Encounter: Payer: Self-pay | Admitting: Obstetrics and Gynecology

## 2021-02-21 LAB — SURGICAL PATHOLOGY

## 2021-02-27 ENCOUNTER — Encounter: Payer: Self-pay | Admitting: Obstetrics and Gynecology

## 2021-02-27 ENCOUNTER — Other Ambulatory Visit: Payer: Self-pay

## 2021-02-27 ENCOUNTER — Ambulatory Visit (INDEPENDENT_AMBULATORY_CARE_PROVIDER_SITE_OTHER): Payer: Medicaid Other | Admitting: Obstetrics and Gynecology

## 2021-02-27 VITALS — BP 106/71 | HR 77 | Ht 60.0 in | Wt 193.6 lb

## 2021-02-27 DIAGNOSIS — Z9889 Other specified postprocedural states: Secondary | ICD-10-CM

## 2021-02-27 DIAGNOSIS — N809 Endometriosis, unspecified: Secondary | ICD-10-CM

## 2021-02-27 NOTE — Progress Notes (Signed)
HPI:      Mandy Romero is a 25 y.o. [phone] who LMP was No LMP recorded (lmp unknown).  Subjective:   She presents today approximately a week from laparoscopic left oophorectomy.  She reports that she is doing well.  She has more pain on her left side but this is not limiting her.  She is ambulating voiding and eating without difficulty.    Hx: The following portions of the patient's history were reviewed and updated as appropriate:             She  has a past medical history of Migraine, Ovarian cyst, Ovarian cyst, Peritonsillar abscess, and Preterm labor. She does not have any pertinent problems on file. She  has a past surgical history that includes Laparoscopic ovarian cystectomy (Left, 07/30/2020); Laparoscopic lysis of adhesions (07/30/2020); Cholecystectomy (05/2012); Laparoscopic ovarian cystectomy (Left, 02/20/2021); and laparoscopy (N/A, 02/20/2021). Her family history includes Cancer in her maternal aunt; Diabetes in her father; Gestational diabetes in her sister; Hypertension in her father. She  reports that she has never smoked. She has never used smokeless tobacco. She reports current alcohol use. No history on file for drug use. She has a current medication list which includes the following prescription(s): acetaminophen, ibuprofen, and hydrocodone-acetaminophen. She has No Known Allergies.       Review of Systems:  Review of Systems  Constitutional: Denied constitutional symptoms, night sweats, recent illness, fatigue, fever, insomnia and weight loss.  Eyes: Denied eye symptoms, eye pain, photophobia, vision change and visual disturbance.  Ears/Nose/Throat/Neck: Denied ear, nose, throat or neck symptoms, hearing loss, nasal discharge, sinus congestion and sore throat.  Cardiovascular: Denied cardiovascular symptoms, arrhythmia, chest pain/pressure, edema, exercise intolerance, orthopnea and palpitations.  Respiratory: Denied pulmonary symptoms, asthma, pleuritic pain,  productive sputum, cough, dyspnea and wheezing.  Gastrointestinal: Denied, gastro-esophageal reflux, melena, nausea and vomiting.  Genitourinary: Denied genitourinary symptoms including symptomatic vaginal discharge, pelvic relaxation issues, and urinary complaints.  Musculoskeletal: Denied musculoskeletal symptoms, stiffness, swelling, muscle weakness and myalgia.  Dermatologic: Denied dermatology symptoms, rash and scar.  Neurologic: Denied neurology symptoms, dizziness, headache, neck pain and syncope.  Psychiatric: Denied psychiatric symptoms, anxiety and depression.  Endocrine: Denied endocrine symptoms including hot flashes and night sweats.   Meds:   Current Outpatient Medications on File Prior to Visit  Medication Sig Dispense Refill  . acetaminophen (TYLENOL) 500 MG tablet Take 1,000 mg by mouth every 6 (six) hours as needed for moderate pain or headache.    . ibuprofen (ADVIL) 200 MG tablet Take 400 mg by mouth every 6 (six) hours as needed for headache or moderate pain.    Marland Kitchen HYDROcodone-acetaminophen (NORCO/VICODIN) 5-325 MG tablet Take 1-2 tablets by mouth every 6 (six) hours as needed for moderate pain. (Patient not taking: Reported on 02/27/2021) 30 tablet 0   No current facility-administered medications on file prior to visit.          Objective:     Vitals:   02/27/21 0858  BP: 106/71  Pulse: 77   Filed Weights   02/27/21 0858  Weight: 193 lb 9.6 oz (87.8 kg)               Abdomen: Soft.  Non-tender.  No masses.  No HSM.  Incision/s: Intact.  Healing well.  No erythema.  No drainage.      Assessment:    [id number] Patient Active Problem List   Diagnosis Date Noted  . Ovarian cyst 07/30/2020  . Pelvic adhesions 07/30/2020  .  Pelvic pain in female 07/30/2020  . Vaginal bleeding in pregnancy, third trimester 01/23/2017  . Migraine 01/08/2017  . Abnormal glucose tolerance in pregnancy 12/25/2016  . Pelvic pain in pregnancy, antepartum, third trimester  12/21/2016  . Headache in pregnancy 11/27/2016  . H/O preterm delivery, currently pregnant, third trimester 09/01/2016  . Late prenatal care affecting pregnancy in second trimester 09/01/2016  . Obesity (BMI 30-39.9) 08/30/2016  . Maternal varicella, non-immune 08/30/2016     1. Post-operative state   2. Endometriosis determined by laparoscopy     Patient doing well postop.   Plan:            1.  We have discussed the natural course and history of endometriosis.  Pathology results discussed.  Use of medication to suppress growth discussed.  Future pregnancy discussed.  At her next postop appointment will start OCPs or possibly consider hormonal IUD.  2.  Patient may resume normal activities. Orders No orders of the defined types were placed in this encounter.   No orders of the defined types were placed in this encounter.     F/U  Return in about 4 weeks (around 03/27/2021).  Elonda Husky, M.D. 02/27/2021 9:08 AM

## 2021-03-27 ENCOUNTER — Other Ambulatory Visit: Payer: Self-pay

## 2021-03-27 ENCOUNTER — Ambulatory Visit (INDEPENDENT_AMBULATORY_CARE_PROVIDER_SITE_OTHER): Payer: Medicaid Other | Admitting: Obstetrics and Gynecology

## 2021-03-27 ENCOUNTER — Encounter: Payer: Self-pay | Admitting: Obstetrics and Gynecology

## 2021-03-27 VITALS — BP 102/65 | HR 71 | Ht 60.0 in | Wt 192.3 lb

## 2021-03-27 DIAGNOSIS — Z9889 Other specified postprocedural states: Secondary | ICD-10-CM

## 2021-03-27 DIAGNOSIS — N809 Endometriosis, unspecified: Secondary | ICD-10-CM

## 2021-03-27 MED ORDER — NORETHIN-ETH ESTRAD-FE BIPHAS 1 MG-10 MCG / 10 MCG PO TABS: 1.0000 | ORAL_TABLET | Freq: Every day | ORAL | 1 refills | Status: DC

## 2021-03-27 NOTE — Progress Notes (Signed)
HPI:      Ms. Atiyana Romero is a 25 y.o. [phone] who LMP was No LMP recorded (lmp unknown).  Subjective:   She presents today approximately 6 weeks from her surgery.  She reports her pain is mostly gone except an occasional twinge on her left side.  She is taking OCPs as directed.  She says that she generally feels much better.    Hx: The following portions of the patient's history were reviewed and updated as appropriate:             She  has a past medical history of Migraine, Ovarian cyst, Ovarian cyst, Peritonsillar abscess, and Preterm labor. She does not have any pertinent problems on file. She  has a past surgical history that includes Laparoscopic ovarian cystectomy (Left, 07/30/2020); Laparoscopic lysis of adhesions (07/30/2020); Cholecystectomy (05/2012); Laparoscopic ovarian cystectomy (Left, 02/20/2021); and laparoscopy (N/A, 02/20/2021). Her family history includes Cancer in her maternal aunt; Diabetes in her father; Gestational diabetes in her sister; Hypertension in her father. She  reports that she has never smoked. She has never used smokeless tobacco. She reports current alcohol use. She reports that she does not use drugs. She has a current medication list which includes the following prescription(s): norethindrone-ethinyl estradiol-fe biphas. She has No Known Allergies.       Review of Systems:  Review of Systems  Constitutional: Denied constitutional symptoms, night sweats, recent illness, fatigue, fever, insomnia and weight loss.  Eyes: Denied eye symptoms, eye pain, photophobia, vision change and visual disturbance.  Ears/Nose/Throat/Neck: Denied ear, nose, throat or neck symptoms, hearing loss, nasal discharge, sinus congestion and sore throat.  Cardiovascular: Denied cardiovascular symptoms, arrhythmia, chest pain/pressure, edema, exercise intolerance, orthopnea and palpitations.  Respiratory: Denied pulmonary symptoms, asthma, pleuritic pain, productive sputum,  cough, dyspnea and wheezing.  Gastrointestinal: Denied, gastro-esophageal reflux, melena, nausea and vomiting.  Genitourinary: Denied genitourinary symptoms including symptomatic vaginal discharge, pelvic relaxation issues, and urinary complaints.  Musculoskeletal: Denied musculoskeletal symptoms, stiffness, swelling, muscle weakness and myalgia.  Dermatologic: Denied dermatology symptoms, rash and scar.  Neurologic: Denied neurology symptoms, dizziness, headache, neck pain and syncope.  Psychiatric: Denied psychiatric symptoms, anxiety and depression.  Endocrine: Denied endocrine symptoms including hot flashes and night sweats.   Meds:   No current outpatient medications on file prior to visit.   No current facility-administered medications on file prior to visit.       The pregnancy intention screening data noted above was reviewed. Potential methods of contraception were discussed. The patient elected to proceed with Oral Contraceptive.     Objective:     Vitals:   03/27/21 1056  BP: 102/65  Pulse: 71   Filed Weights   03/27/21 1056  Weight: 192 lb 4.8 oz (87.2 kg)                Assessment:    [id number] Patient Active Problem List   Diagnosis Date Noted  . Ovarian cyst 07/30/2020  . Pelvic adhesions 07/30/2020  . Pelvic pain in female 07/30/2020  . Vaginal bleeding in pregnancy, third trimester 01/23/2017  . Migraine 01/08/2017  . Abnormal glucose tolerance in pregnancy 12/25/2016  . Pelvic pain in pregnancy, antepartum, third trimester 12/21/2016  . Headache in pregnancy 11/27/2016  . H/O preterm delivery, currently pregnant, third trimester 09/01/2016  . Late prenatal care affecting pregnancy in second trimester 09/01/2016  . Obesity (BMI 30-39.9) 08/30/2016  . Maternal varicella, non-immune 08/30/2016     1. Endometriosis determined by laparoscopy  2. Post-operative state     Patient doing well postop on OCPs.   Plan:            1.  Continue  OCPs.  2.  May resume normal activities. Orders No orders of the defined types were placed in this encounter.    Meds ordered this encounter  Medications  . Norethindrone-Ethinyl Estradiol-Fe Biphas (LO LOESTRIN FE) 1 MG-10 MCG / 10 MCG tablet    Sig: Take 1 tablet by mouth daily.    Dispense:  84 tablet    Refill:  1      F/U  Return in about 3 months (around 06/27/2021).  Elonda Husky, M.D. 03/27/2021 11:13 AM

## 2021-05-08 ENCOUNTER — Ambulatory Visit: Payer: Medicaid Other | Admitting: Dermatology

## 2021-05-16 ENCOUNTER — Ambulatory Visit: Payer: Medicaid Other | Admitting: Dermatology

## 2021-06-27 ENCOUNTER — Other Ambulatory Visit: Payer: Self-pay

## 2021-06-27 ENCOUNTER — Ambulatory Visit (INDEPENDENT_AMBULATORY_CARE_PROVIDER_SITE_OTHER): Payer: Medicaid Other | Admitting: Obstetrics and Gynecology

## 2021-06-27 ENCOUNTER — Encounter: Payer: Self-pay | Admitting: Obstetrics and Gynecology

## 2021-06-27 VITALS — BP 102/67 | HR 69 | Resp 16 | Ht 60.0 in | Wt 188.1 lb

## 2021-06-27 DIAGNOSIS — N809 Endometriosis, unspecified: Secondary | ICD-10-CM | POA: Diagnosis not present

## 2021-06-27 DIAGNOSIS — Z3041 Encounter for surveillance of contraceptive pills: Secondary | ICD-10-CM | POA: Diagnosis not present

## 2021-06-27 DIAGNOSIS — R102 Pelvic and perineal pain: Secondary | ICD-10-CM

## 2021-06-27 DIAGNOSIS — Z9889 Other specified postprocedural states: Secondary | ICD-10-CM | POA: Diagnosis not present

## 2021-06-27 MED ORDER — NORETHIN-ETH ESTRAD-FE BIPHAS 1 MG-10 MCG / 10 MCG PO TABS: 1.0000 | ORAL_TABLET | Freq: Every day | ORAL | 1 refills | Status: DC

## 2021-06-27 NOTE — Progress Notes (Signed)
HPI:      [name] is a 25 y.o. [phone] who LMP was Patient's last menstrual period was 06/23/2021 (exact date).  Subjective:   She presents today approximately 4 months from laparoscopic excision of left tube and ovary for endometrioma and pelvic adhesive disease/endometriosis. She reports that she is doing well.  She does not have pelvic pain.  She is continuing to take OCPs and having normal regular "light" cycles. She does complain of occasional pain at her left sided incision site with heavy lifting.  She reports this is more of an annoyance than a disabling finding.    Hx: The following portions of the patient's history were reviewed and updated as appropriate:             She  has a past medical history of Migraine, Ovarian cyst, Ovarian cyst, Peritonsillar abscess, and Preterm labor. She does not have any pertinent problems on file. She  has a past surgical history that includes Laparoscopic ovarian cystectomy (Left, 07/30/2020); Laparoscopic lysis of adhesions (07/30/2020); Cholecystectomy (05/2012); Laparoscopic ovarian cystectomy (Left, 02/20/2021); and laparoscopy (N/A, 02/20/2021). Her family history includes Cancer in her maternal aunt; Diabetes in her father; Gestational diabetes in her sister; Hypertension in her father. She  reports that she has never smoked. She has never used smokeless tobacco. She reports current alcohol use. She reports that she does not use drugs. She has a current medication list which includes the following prescription(s): norethindrone-ethinyl estradiol-fe biphas. She has No Known Allergies.       Review of Systems:  Review of Systems  Constitutional: Denied constitutional symptoms, night sweats, recent illness, fatigue, fever, insomnia and weight loss.  Eyes: Denied eye symptoms, eye pain, photophobia, vision change and visual disturbance.  Ears/Nose/Throat/Neck: Denied ear, nose, throat or neck symptoms, hearing loss, nasal discharge,  sinus congestion and sore throat.  Cardiovascular: Denied cardiovascular symptoms, arrhythmia, chest pain/pressure, edema, exercise intolerance, orthopnea and palpitations.  Respiratory: Denied pulmonary symptoms, asthma, pleuritic pain, productive sputum, cough, dyspnea and wheezing.  Gastrointestinal: Denied, gastro-esophageal reflux, melena, nausea and vomiting.  Genitourinary: See HPI for additional information.  Musculoskeletal: Denied musculoskeletal symptoms, stiffness, swelling, muscle weakness and myalgia.  Dermatologic: Denied dermatology symptoms, rash and scar.  Neurologic: Denied neurology symptoms, dizziness, headache, neck pain and syncope.  Psychiatric: Denied psychiatric symptoms, anxiety and depression.  Endocrine: Denied endocrine symptoms including hot flashes and night sweats.   Meds:   No current outpatient medications on file prior to visit.   No current facility-administered medications on file prior to visit.      Objective:     Vitals:   06/27/21 1004  BP: 102/67  Pulse: 69  Resp: 16   Filed Weights   06/27/21 1004  Weight: 188 lb 1.6 oz (85.3 kg)              Abdominal examination reveals all incisions healing well.  Some deep pain with palpation of the left sided incision but no obvious bulge or herniation noted even with Valsalva.          Assessment:    [id number] Patient Active Problem List   Diagnosis Date Noted   Ovarian cyst 07/30/2020   Pelvic adhesions 07/30/2020   Pelvic pain in female 07/30/2020   Vaginal bleeding in pregnancy, third trimester 01/23/2017   Migraine 01/08/2017   Abnormal glucose tolerance in pregnancy 12/25/2016   Pelvic pain in pregnancy, antepartum, third trimester 12/21/2016   Headache in pregnancy 11/27/2016   H/O preterm delivery,  currently pregnant, third trimester 09/01/2016   Late prenatal care affecting pregnancy in second trimester 09/01/2016   Obesity (BMI 30-39.9) 08/30/2016   Maternal varicella,  non-immune 08/30/2016     1. Pelvic pain in female   2. Post-operative state   3. Encounter for surveillance of contraceptive pills   4. Endometriosis determined by laparoscopy     Patient generally doing very well.  Taking OCPs as directed and doing well on them.  Some left-sided incisional pain occasional.   Plan:            1.  Expectant management of left-sided incisional pain.  Patient to contact us if this continues to get worse.  Possible incisional hernia although not palpable at this time.  2.  Endometriosis/endometrioma-taking OCPs and doing well.  3.  Plan follow-up for annual examination -February with Pap smear. Orders No orders of the defined types were placed in this encounter.    Meds ordered this encounter  Medications   Norethindrone-Ethinyl Estradiol-Fe Biphas (LO LOESTRIN FE) 1 MG-10 MCG / 10 MCG tablet    Sig: Take 1 tablet by mouth daily.    Dispense:  84 tablet    Refill:  1      F/U  Return in about 6 months (around 12/25/2021) for Annual Physical. I spent 23 minutes involved in the care of this patient preparing to see the patient by obtaining and reviewing her medical history (including labs, imaging tests and prior procedures), documenting clinical information in the electronic health record (EHR), counseling and coordinating care plans, writing and sending prescriptions, ordering tests or procedures and in direct communicating with the patient and medical staff discussing pertinent items from her history and physical exam.  Elonda Husky, M.D. 06/27/2021 10:29 AM

## 2021-06-28 ENCOUNTER — Encounter: Payer: Medicaid Other | Admitting: Obstetrics and Gynecology

## 2021-08-15 IMAGING — CT CT RENAL STONE PROTOCOL
3 of 4 series · 8 of 46 positions shown, 15 images · non-contrast
Comparison: CT abdomen and pelvis 04/03/2020.

CLINICAL DATA: Left lower quadrant pain for 2 weeks which has moved
into the patient's back.

EXAM:
CT ABDOMEN AND PELVIS WITHOUT CONTRAST
TECHNIQUE: Multidetector CT imaging of the abdomen and pelvis was performed
following the standard protocol without IV contrast.

[Series 4: lung bases · axial · 0.77mm/px · z∈[-530,-470]mm · 4 of 21 slices shown, 9 images]
[im 5/21  soft-tissue]
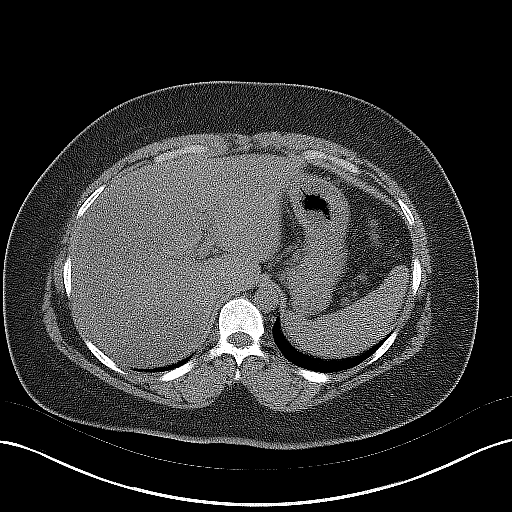
[im 5/21  lung]
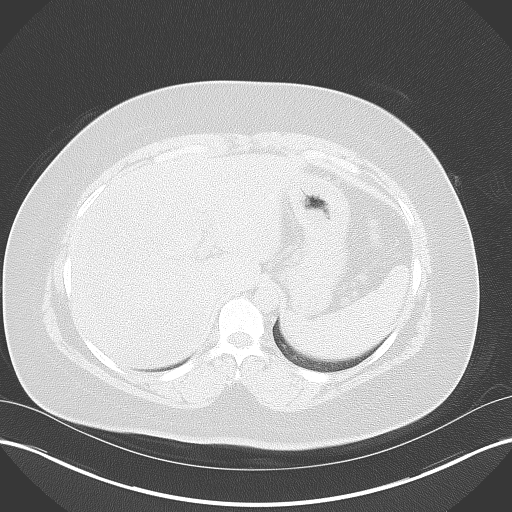
[im 5/21  bone]
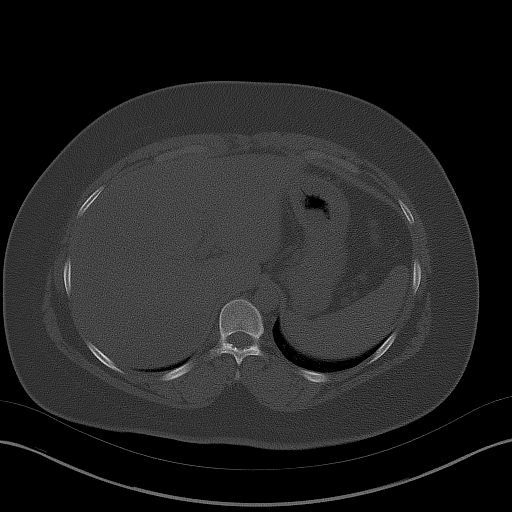
[im 9/21  soft-tissue]
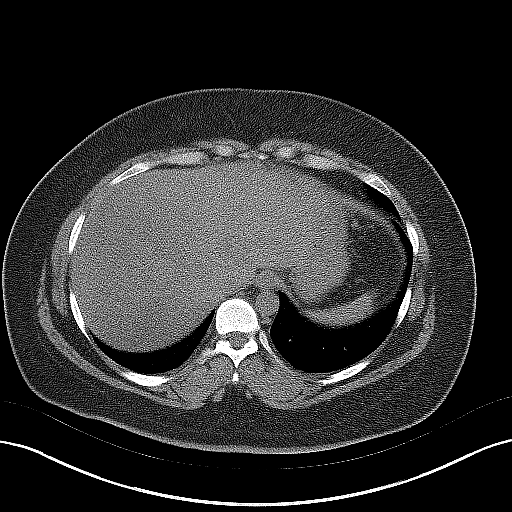
[im 9/21  lung]
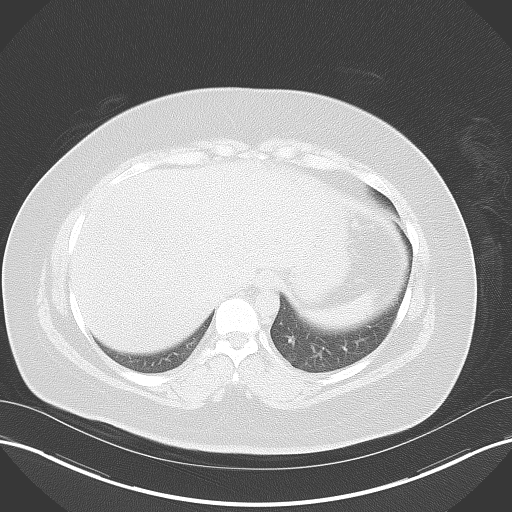
[im 13/21  soft-tissue]
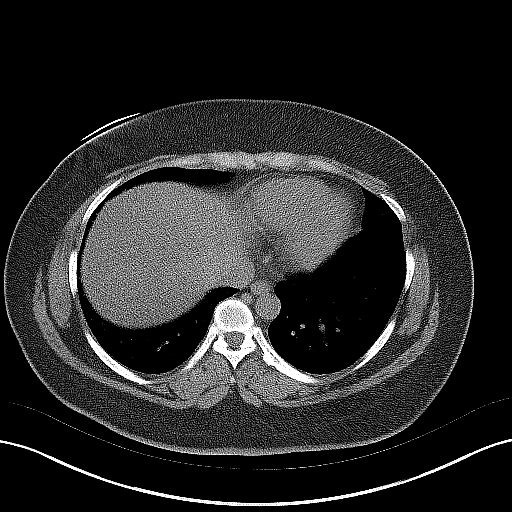
[im 13/21  lung]
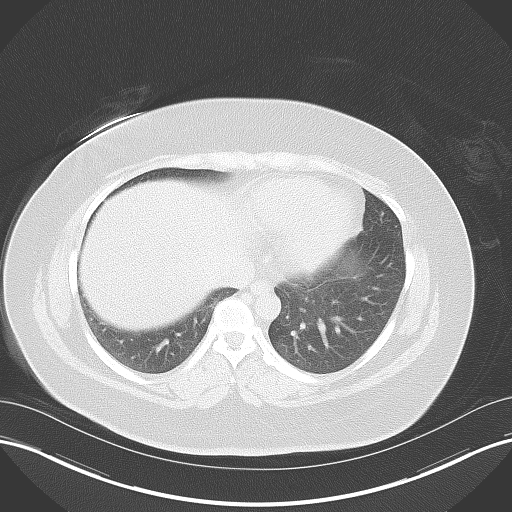
[im 17/21  soft-tissue]
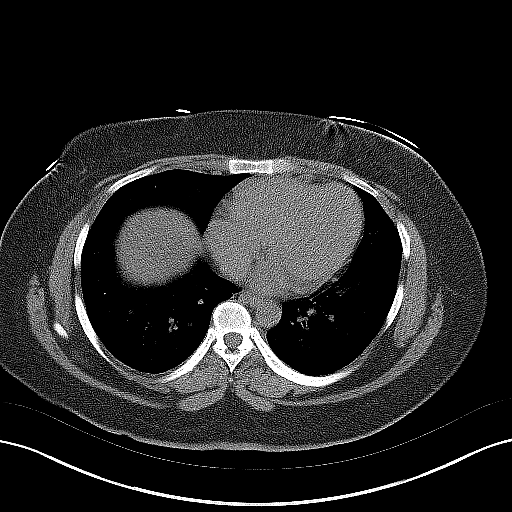
[im 17/21  lung]
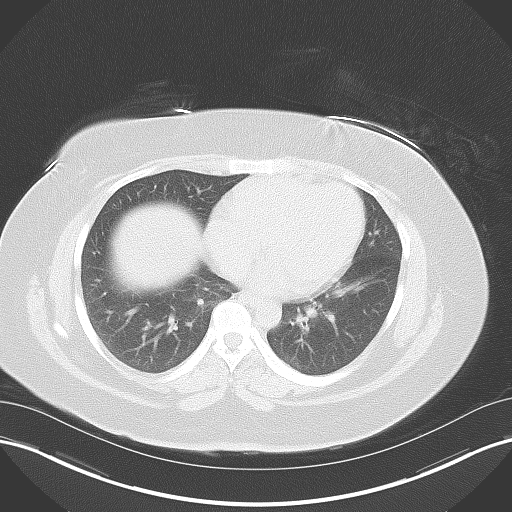

[Series 5: coronal · coronal · 0.72mm/px · 3 of 118 slices shown, 4 images]
[im 40/118  soft-tissue]
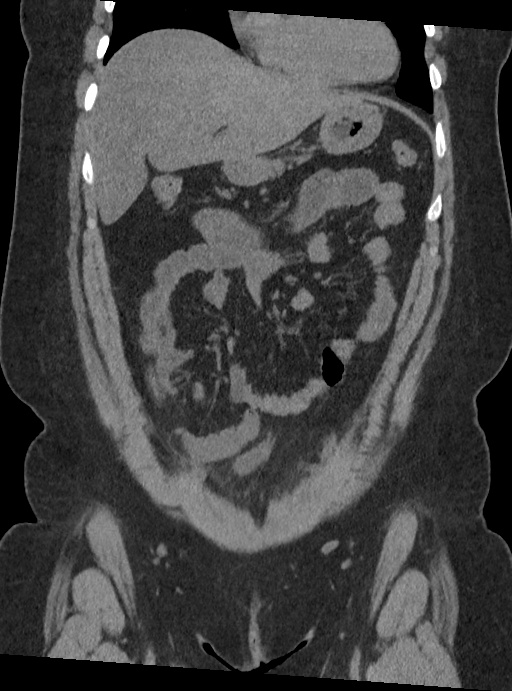
[im 53/118  soft-tissue]
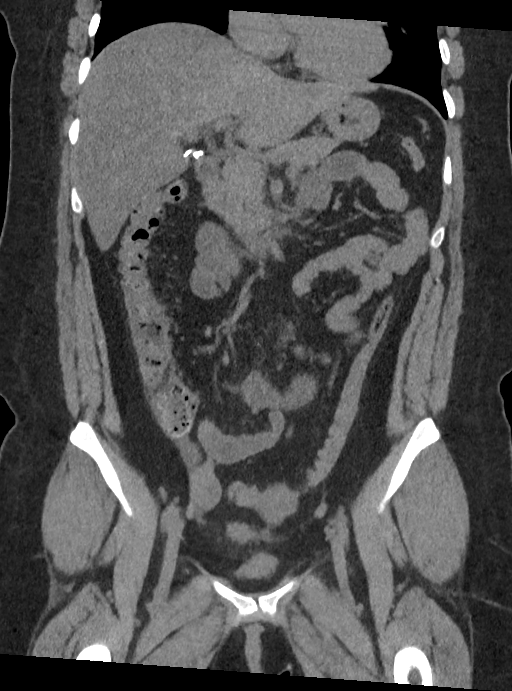
[im 53/118  bone]
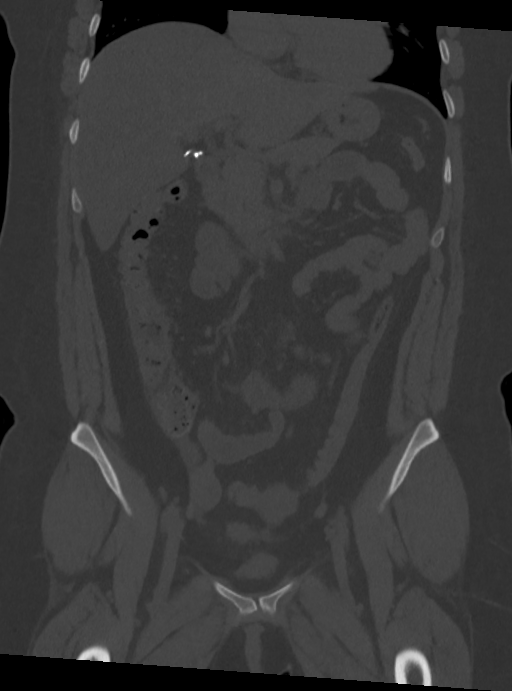
[im 66/118  soft-tissue]
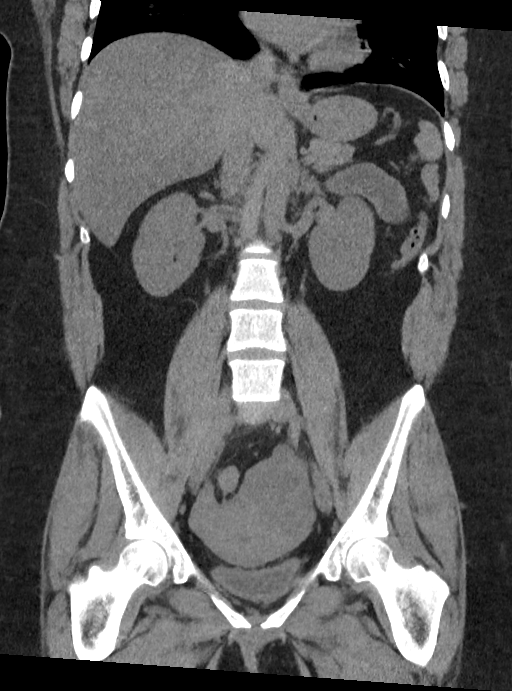

[Series 6: sagittal · sagittal · 0.68mm/px · 1 of 166 slices shown, 2 images]
[im 56/166  soft-tissue]
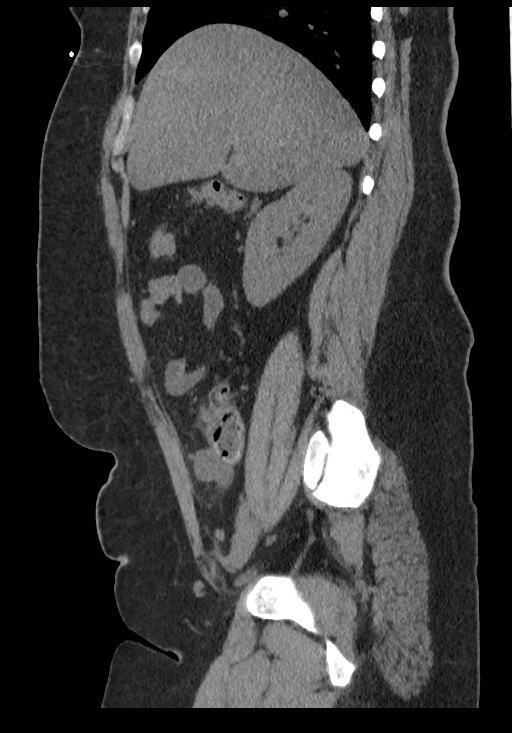
[im 56/166  bone]
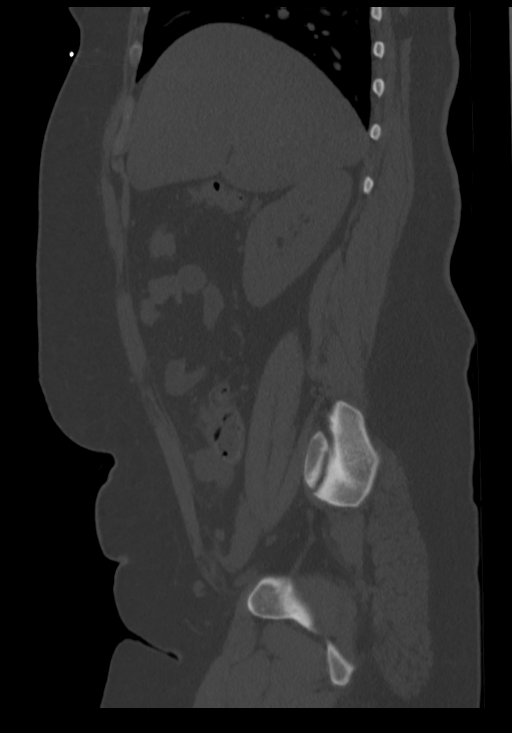

[8 of 46 positions shown; findings below may reference images not displayed]

FINDINGS: Lower chest: Lung bases clear.  No pleural or pericardial effusion.

Hepatobiliary: No focal liver abnormality is seen. Status post
cholecystectomy. No biliary dilatation. There is diffuse fatty
infiltration of the liver.

Pancreas: Unremarkable. No pancreatic ductal dilatation or
surrounding inflammatory changes.

Spleen: Normal in size without focal abnormality.

Adrenals/Urinary Tract: Adrenal glands are unremarkable. Kidneys are
normal, without renal calculi, focal lesion, or hydronephrosis.
Bladder is unremarkable.

Stomach/Bowel: Stomach is within normal limits. Appendix appears
normal. No evidence of bowel wall thickening, distention, or
inflammatory changes.

Vascular/Lymphatic: No significant vascular findings are present. No
enlarged abdominal or pelvic lymph nodes.

Reproductive: The left ovary is enlarged measuring approximately
cm craniocaudal x 6.9 cm AP x 5 cm transverse. The ovary measures
5.8 x 4.1 x 3.6 cm on the prior CT. Ill-defined cystic lesions in
the left ovary measure up to approximately 3.2 cm in diameter. The
right ovary and uterus appear normal.

Other: Small volume of free pelvic fluid is seen.

Musculoskeletal: No acute or focal abnormality.
IMPRESSION: Enlarged left ovary cannot be definitively characterized. The ovary
was prominent on the prior CT but has increased in size. Recommend
pelvic ultrasound with Doppler imaging for further evaluation.

Negative for urinary tract stone.

Fatty infiltration of the liver.

## 2021-12-27 ENCOUNTER — Encounter: Payer: Medicaid Other | Admitting: Obstetrics and Gynecology

## 2021-12-27 DIAGNOSIS — Z124 Encounter for screening for malignant neoplasm of cervix: Secondary | ICD-10-CM

## 2021-12-27 DIAGNOSIS — Z01419 Encounter for gynecological examination (general) (routine) without abnormal findings: Secondary | ICD-10-CM

## 2022-02-05 ENCOUNTER — Other Ambulatory Visit: Payer: Self-pay

## 2022-02-05 ENCOUNTER — Encounter: Payer: Self-pay | Admitting: Obstetrics and Gynecology

## 2022-02-05 ENCOUNTER — Ambulatory Visit (INDEPENDENT_AMBULATORY_CARE_PROVIDER_SITE_OTHER): Payer: Medicaid Other | Admitting: Obstetrics and Gynecology

## 2022-02-05 ENCOUNTER — Other Ambulatory Visit (HOSPITAL_COMMUNITY)
Admission: RE | Admit: 2022-02-05 | Discharge: 2022-02-05 | Disposition: A | Discharge status: Home / Self Care | Payer: Medicaid Other | Source: Ambulatory Visit | Attending: Obstetrics and Gynecology | Admitting: Obstetrics and Gynecology

## 2022-02-05 VITALS — BP 110/77 | HR 71 | Ht 60.0 in | Wt 181.2 lb

## 2022-02-05 DIAGNOSIS — Z01419 Encounter for gynecological examination (general) (routine) without abnormal findings: Secondary | ICD-10-CM | POA: Insufficient documentation

## 2022-02-05 DIAGNOSIS — Z124 Encounter for screening for malignant neoplasm of cervix: Secondary | ICD-10-CM | POA: Insufficient documentation

## 2022-02-05 DIAGNOSIS — N809 Endometriosis, unspecified: Secondary | ICD-10-CM

## 2022-02-05 MED ORDER — NORETHIN-ETH ESTRAD-FE BIPHAS 1 MG-10 MCG / 10 MCG PO TABS: 1.0000 | ORAL_TABLET | Freq: Every day | ORAL | 3 refills | Status: DC

## 2022-02-05 NOTE — Progress Notes (Signed)
Patients presents for annual exam today. Patient states she is doing well since last visit, likes her current OCP. ?Patient is due for pap smear, ordered.Patient states no other questions or concerns at this time.   ?

## 2022-02-05 NOTE — Progress Notes (Signed)
HPI: ?     Mandy Romero is a 26 y.o. [phone] who LMP was No LMP recorded. (Menstrual status: Oophorectomy). ? ?Subjective:  ? ?She presents today for her annual examination.  She has no complaints.  She is taking OCPs as directed and having normal regular cycles.  She says they are very light.  She would like to continue OCPs.  She no longer has pelvic pain. ?She has begun a systematic program of eating better and she walks " 5 miles per day" which has allowed her to lose weight.  She is understandably excited about this!! ? ?  Hx: ?The following portions of the patient's history were reviewed and updated as appropriate: ?            She  has a past medical history of Migraine, Ovarian cyst, Ovarian cyst, Peritonsillar abscess, and Preterm labor. ?She does not have any pertinent problems on file. ?She  has a past surgical history that includes Laparoscopic ovarian cystectomy (Left, 07/30/2020); Laparoscopic lysis of adhesions (07/30/2020); Cholecystectomy (05/2012); Laparoscopic ovarian cystectomy (Left, 02/20/2021); and laparoscopy (N/A, 02/20/2021). ?Her family history includes Cancer in her maternal aunt; Diabetes in her father; Gestational diabetes in her sister; Hypertension in her father. ?She  reports that she has never smoked. She has never used smokeless tobacco. She reports that she does not currently use alcohol. She reports that she does not use drugs. ?She has a current medication list which includes the following prescription(s): norethindrone-ethinyl estradiol-fe biphas. ?She has No Known Allergies. ?      ?Review of Systems:  ?Review of Systems ? ?Constitutional: Denied constitutional symptoms, night sweats, recent illness, fatigue, fever, insomnia and weight loss.  ?Eyes: Denied eye symptoms, eye pain, photophobia, vision change and visual disturbance.  ?Ears/Nose/Throat/Neck: Denied ear, nose, throat or neck symptoms, hearing loss, nasal discharge, sinus congestion and sore throat.   ?Cardiovascular: Denied cardiovascular symptoms, arrhythmia, chest pain/pressure, edema, exercise intolerance, orthopnea and palpitations.  ?Respiratory: Denied pulmonary symptoms, asthma, pleuritic pain, productive sputum, cough, dyspnea and wheezing.  ?Gastrointestinal: Denied, gastro-esophageal reflux, melena, nausea and vomiting.  ?Genitourinary: Denied genitourinary symptoms including symptomatic vaginal discharge, pelvic relaxation issues, and urinary complaints.  ?Musculoskeletal: Denied musculoskeletal symptoms, stiffness, swelling, muscle weakness and myalgia.  ?Dermatologic: Denied dermatology symptoms, rash and scar.  ?Neurologic: Denied neurology symptoms, dizziness, headache, neck pain and syncope.  ?Psychiatric: Denied psychiatric symptoms, anxiety and depression.  ?Endocrine: Denied endocrine symptoms including hot flashes and night sweats.  ? ?Meds: ?  ?No current outpatient medications on file prior to visit.  ? ?No current facility-administered medications on file prior to visit.  ? ? ? ?Objective:  ?  ? ?Vitals:  ? 02/05/22 1123  ?BP: 110/77  ?Pulse: 71  ?  ?Filed Weights  ? 02/05/22 1123  ?Weight: 181 lb 3.2 oz (82.2 kg)  ? ?  ?         Physical examination ?General NAD, Conversant  ?HEENT Atraumatic; Op clear with mmm.  Normo-cephalic. Pupils reactive. Anicteric sclerae  ?Thyroid/Neck Smooth without nodularity or enlargement. Normal ROM.  Neck Supple.  ?Skin No rashes, lesions or ulceration. Normal palpated skin turgor. No nodularity.  ?Breasts: No masses or discharge.  Symmetric.  No axillary adenopathy.  ?Lungs: Clear to auscultation.No rales or wheezes. Normal Respiratory effort, no retractions.  ?Heart: NSR.  No murmurs or rubs appreciated. No periferal edema  ?Abdomen: Soft.  Non-tender.  No masses.  No HSM. No hernia  ?Extremities: Moves all appropriately.  Normal ROM for  age. No lymphadenopathy.  ?Neuro: Oriented to PPT.  Normal mood. Normal affect.  ? ?  Pelvic:   ?Vulva: Normal  appearance.  No lesions.  ?Vagina: No lesions or abnormalities noted.  ?Support: Normal pelvic support.  ?Urethra No masses tenderness or scarring.  ?Meatus Normal size without lesions or prolapse.  ?Cervix: Normal appearance.  No lesions.  ?Anus: Normal exam.  No lesions.  ?Perineum: Normal exam.  No lesions.  ?      Bimanual   ?Uterus: Normal size.  Non-tender.  Mobile.  AV.  ?Adnexae: No masses.  Non-tender to palpation.  ?Cul-de-sac: Negative for abnormality.  ? ? ? ?Assessment:  ?  ?[id number] ?Patient Active Problem List  ? Diagnosis Date Noted  ? Ovarian cyst 07/30/2020  ? Pelvic adhesions 07/30/2020  ? Pelvic pain in female 07/30/2020  ? Vaginal bleeding in pregnancy, third trimester 01/23/2017  ? Migraine 01/08/2017  ? Abnormal glucose tolerance in pregnancy 12/25/2016  ? Pelvic pain in pregnancy, antepartum, third trimester 12/21/2016  ? Headache in pregnancy 11/27/2016  ? H/O preterm delivery, currently pregnant, third trimester 09/01/2016  ? Late prenatal care affecting pregnancy in second trimester 09/01/2016  ? Obesity (BMI 30-39.9) 08/30/2016  ? Maternal varicella, non-immune 08/30/2016  ? ?  ?1. Well woman exam with routine gynecological exam   ?2. Cervical cancer screening   ?3. Endometriosis determined by laparoscopy   ? ?  ? ? ?Plan:  ?  ?       ? 1.  Basic Screening Recommendations ?The basic screening recommendations for asymptomatic women were discussed with the patient during her visit.  The age-appropriate recommendations were discussed with her and the rational for the tests reviewed.  When I am informed by the patient that another primary care physician has previously obtained the age-appropriate tests and they are up-to-date, only outstanding tests are ordered and referrals given as necessary.  Abnormal results of tests will be discussed with her when all of her results are completed.  Routine preventative health maintenance measures emphasized: Exercise/Diet/Weight control, Tobacco Warnings,  Alcohol/Substance use risks and Stress Management ?Pap performed ?2.  Continue OCPs ?3.  Encouraged continued weight loss. ? ?Orders ?No orders of the defined types were placed in this encounter. ? ?  ?Meds ordered this encounter  ?Medications  ? Norethindrone-Ethinyl Estradiol-Fe Biphas (LO LOESTRIN FE) 1 MG-10 MCG / 10 MCG tablet  ?  Sig: Take 1 tablet by mouth daily.  ?  Dispense:  84 tablet  ?  Refill:  3  ?    ?  ?  F/U ? Return in about 1 year (around 02/06/2023) for Annual Physical. ? ?Elonda Husky, M.D. ?02/05/2022 ?11:40 AM ? ? ?

## 2022-02-06 LAB — CYTOLOGY - PAP
Chlamydia: NEGATIVE
Comment: NEGATIVE
Comment: NORMAL
Diagnosis: NEGATIVE
Neisseria Gonorrhea: NEGATIVE

## 2022-07-22 ENCOUNTER — Encounter: Payer: Self-pay | Admitting: Obstetrics and Gynecology

## 2022-07-22 ENCOUNTER — Ambulatory Visit: Payer: Medicaid Other | Admitting: Obstetrics and Gynecology

## 2022-07-22 VITALS — BP 97/67 | HR 76 | Ht 60.0 in | Wt 192.1 lb

## 2022-07-22 DIAGNOSIS — R102 Pelvic and perineal pain: Secondary | ICD-10-CM

## 2022-07-22 NOTE — Progress Notes (Signed)
Patient presents today due to left sided pain in pelvic region. She states the pain is similar to her previous pain that resulted in an oophorectomy and has been ongoing for approximately 3-4 months. No additional concerns at this time.

## 2022-07-22 NOTE — Progress Notes (Signed)
HPI:      [name] is a 26 y.o. [phone] who LMP was No LMP recorded. (Menstrual status: Oophorectomy).  Subjective:   She presents today with 45-month history of left-sided pelvic pain.  Pain is mostly intermittent.  She has pain with intercourse and urination.  Denies pain with bowel movements.  States the pain is somewhat worse with her menses.  She continues to take OCPs as directed. She states the pain is similar to the pain she had prior to her previous surgery.  Surgery was left oophorectomy left salpingectomy and excision of a pelvic cyst.  Pathology findings showed endometrioma/endometriosis.    Hx: The following portions of the patient's history were reviewed and updated as appropriate:             She  has a past medical history of Migraine, Ovarian cyst, Ovarian cyst, Peritonsillar abscess, and Preterm labor. She does not have any pertinent problems on file. She  has a past surgical history that includes Laparoscopic ovarian cystectomy (Left, 07/30/2020); Laparoscopic lysis of adhesions (07/30/2020); Cholecystectomy (05/2012); Laparoscopic ovarian cystectomy (Left, 02/20/2021); and laparoscopy (N/A, 02/20/2021). Her family history includes Cancer in her maternal aunt; Diabetes in her father; Gestational diabetes in her sister; Hypertension in her father. She  reports that she has never smoked. She has never used smokeless tobacco. She reports that she does not currently use alcohol. She reports that she does not use drugs. She has a current medication list which includes the following prescription(s): norethindrone-ethinyl estradiol-fe biphas. She has No Known Allergies.       Review of Systems:  Review of Systems  Constitutional: Denied constitutional symptoms, night sweats, recent illness, fatigue, fever, insomnia and weight loss.  Eyes: Denied eye symptoms, eye pain, photophobia, vision change and visual disturbance.  Ears/Nose/Throat/Neck: Denied ear, nose, throat or  neck symptoms, hearing loss, nasal discharge, sinus congestion and sore throat.  Cardiovascular: Denied cardiovascular symptoms, arrhythmia, chest pain/pressure, edema, exercise intolerance, orthopnea and palpitations.  Respiratory: Denied pulmonary symptoms, asthma, pleuritic pain, productive sputum, cough, dyspnea and wheezing.  Gastrointestinal: Denied, gastro-esophageal reflux, melena, nausea and vomiting.  Genitourinary: See HPI for additional information.  Musculoskeletal: Denied musculoskeletal symptoms, stiffness, swelling, muscle weakness and myalgia.  Dermatologic: Denied dermatology symptoms, rash and scar.  Neurologic: Denied neurology symptoms, dizziness, headache, neck pain and syncope.  Psychiatric: Denied psychiatric symptoms, anxiety and depression.  Endocrine: Denied endocrine symptoms including hot flashes and night sweats.   Meds:   Current Outpatient Medications on File Prior to Visit  Medication Sig Dispense Refill   Norethindrone-Ethinyl Estradiol-Fe Biphas (LO LOESTRIN FE) 1 MG-10 MCG / 10 MCG tablet Take 1 tablet by mouth daily. 84 tablet 3   No current facility-administered medications on file prior to visit.      Objective:     Vitals:   07/22/22 1423  BP: 97/67  Pulse: 76   Filed Weights   07/22/22 1423  Weight: 192 lb 1.6 oz (87.1 kg)                        Assessment:    [id number] Patient Active Problem List   Diagnosis Date Noted   Ovarian cyst 07/30/2020   Pelvic adhesions 07/30/2020   Pelvic pain in female 07/30/2020   Vaginal bleeding in pregnancy, third trimester 01/23/2017   Migraine 01/08/2017   Abnormal glucose tolerance in pregnancy 12/25/2016   Pelvic pain in pregnancy, antepartum, third trimester 12/21/2016   Headache in pregnancy  11/27/2016   H/O preterm delivery, currently pregnant, third trimester 09/01/2016   Late prenatal care affecting pregnancy in second trimester 09/01/2016   Obesity (BMI 30-39.9) 08/30/2016    Maternal varicella, non-immune 08/30/2016     1. Pelvic pain in female     History of endometriosis/endometrioma.  Surgical diagnosis with previous oophorectomy salpingectomy.   Plan:            1.  Pelvic ultrasound  2.  Future consideration for Lupron or Myfembree.  Orders Orders Placed This Encounter  Procedures   US PELVIS (TRANSABDOMINAL ONLY)   US PELVIS TRANSVAGINAL NON-OB (TV ONLY)    No orders of the defined types were placed in this encounter.     F/U  Return in about 3 weeks (around 08/12/2022). Patient to follow-up approximately 7 to 10 days after her ultrasound.  May do video visit.  I spent 21 minutes involved in the care of this patient preparing to see the patient by obtaining and reviewing her medical history (including labs, imaging tests and prior procedures), documenting clinical information in the electronic health record (EHR), counseling and coordinating care plans, writing and sending prescriptions, ordering tests or procedures and in direct communicating with the patient and medical staff discussing pertinent items from her history and physical exam.  Finis Bud, M.D. 07/22/2022 2:51 PM

## 2022-07-28 ENCOUNTER — Other Ambulatory Visit: Payer: Medicaid Other

## 2022-08-01 ENCOUNTER — Other Ambulatory Visit: Payer: Medicaid Other

## 2022-08-07 ENCOUNTER — Ambulatory Visit
Admission: RE | Admit: 2022-08-07 | Discharge: 2022-08-07 | Disposition: A | Discharge status: Home / Self Care | Payer: Medicaid Other | Source: Ambulatory Visit | Attending: Obstetrics and Gynecology | Admitting: Obstetrics and Gynecology

## 2022-08-07 DIAGNOSIS — R102 Pelvic and perineal pain: Secondary | ICD-10-CM | POA: Insufficient documentation

## 2022-08-17 NOTE — Progress Notes (Signed)
This is a normal ultrasound with no results to explain pelvic pain. We can address this at your next visit and I still think the previously suggested medications might be a good option

## 2022-09-02 ENCOUNTER — Telehealth: Payer: Medicaid Other | Admitting: Obstetrics and Gynecology

## 2022-10-04 ENCOUNTER — Emergency Department: Payer: Medicaid Other

## 2022-10-04 ENCOUNTER — Emergency Department
Admission: EM | Admit: 2022-10-04 | Discharge: 2022-10-04 | Disposition: A | Discharge status: Home / Self Care | Payer: Medicaid Other | Attending: Emergency Medicine | Admitting: Emergency Medicine

## 2022-10-04 ENCOUNTER — Other Ambulatory Visit: Payer: Self-pay

## 2022-10-04 DIAGNOSIS — J36 Peritonsillar abscess: Secondary | ICD-10-CM | POA: Diagnosis not present

## 2022-10-04 DIAGNOSIS — L0291 Cutaneous abscess, unspecified: Secondary | ICD-10-CM

## 2022-10-04 DIAGNOSIS — J020 Streptococcal pharyngitis: Secondary | ICD-10-CM

## 2022-10-04 DIAGNOSIS — J029 Acute pharyngitis, unspecified: Secondary | ICD-10-CM | POA: Diagnosis present

## 2022-10-04 LAB — COMPREHENSIVE METABOLIC PANEL
ALT: 16 U/L (ref 0–44)
AST: 14 U/L — ABNORMAL LOW (ref 15–41)
Albumin: 4.1 g/dL (ref 3.5–5.0)
Alkaline Phosphatase: 59 U/L (ref 38–126)
Anion gap: 13 (ref 5–15)
BUN: 6 mg/dL (ref 6–20)
CO2: 20 mmol/L — ABNORMAL LOW (ref 22–32)
Calcium: 8.9 mg/dL (ref 8.9–10.3)
Chloride: 104 mmol/L (ref 98–111)
Creatinine, Ser: 0.38 mg/dL — ABNORMAL LOW (ref 0.44–1.00)
GFR, Estimated: >60 mL/min (ref >60)
Glucose, Bld: 97 mg/dL (ref 70–99)
Potassium: 3.6 mmol/L (ref 3.5–5.1)
Sodium: 137 mmol/L (ref 135–145)
Total Bilirubin: 1.1 mg/dL (ref 0.3–1.2)
Total Protein: 8.4 g/dL — ABNORMAL HIGH (ref 6.5–8.1)

## 2022-10-04 LAB — CBC WITH DIFFERENTIAL/PLATELET
Abs Immature Granulocytes: 0.03 10*3/uL (ref 0.00–0.07)
Basophils Absolute: 0 10*3/uL (ref 0.0–0.1)
Basophils Relative: 0 %
Eosinophils Absolute: 0.1 10*3/uL (ref 0.0–0.5)
Eosinophils Relative: 1 %
HCT: 41.1 % (ref 36.0–46.0)
Hemoglobin: 13.6 g/dL (ref 12.0–15.0)
Immature Granulocytes: 0 %
Lymphocytes Relative: 21 %
Lymphs Abs: 2.2 10*3/uL (ref 0.7–4.0)
MCH: 28.6 pg (ref 26.0–34.0)
MCHC: 33.1 g/dL (ref 30.0–36.0)
MCV: 86.5 fL (ref 80.0–100.0)
Monocytes Absolute: 0.7 10*3/uL (ref 0.1–1.0)
Monocytes Relative: 7 %
Neutro Abs: 7.4 10*3/uL (ref 1.7–7.7)
Neutrophils Relative %: 71 %
Platelets: 310 10*3/uL (ref 150–400)
RBC: 4.75 MIL/uL (ref 3.87–5.11)
RDW: 12.3 % (ref 11.5–15.5)
WBC: 10.4 10*3/uL (ref 4.0–10.5)
nRBC: 0 % (ref 0.0–0.2)

## 2022-10-04 LAB — POC URINE PREG, ED: Preg Test, Ur: NEGATIVE

## 2022-10-04 MED ORDER — KETOROLAC TROMETHAMINE 15 MG/ML IJ SOLN
15.0000 mg | Freq: Once | INTRAMUSCULAR | Status: AC
  Administered 2022-10-04: 15 mg via INTRAVENOUS
  Filled 2022-10-04: qty 1

## 2022-10-04 MED ORDER — AMOXICILLIN-POT CLAVULANATE 875-125 MG PO TABS: 1.0000 | ORAL_TABLET | Freq: Two times a day (BID) | ORAL | 0 refills | Status: AC

## 2022-10-04 MED ORDER — PREDNISONE 10 MG (21) PO TBPK: ORAL_TABLET | ORAL | 0 refills | Status: DC

## 2022-10-04 MED ORDER — SODIUM CHLORIDE 0.9 % IV SOLN
3.0000 g | Freq: Once | INTRAVENOUS | Status: AC
  Administered 2022-10-04: 3 g via INTRAVENOUS
  Filled 2022-10-04: qty 8

## 2022-10-04 MED ORDER — IOHEXOL 300 MG/ML  SOLN
100.0000 mL | Freq: Once | INTRAMUSCULAR | Status: AC | PRN
  Administered 2022-10-04: 100 mL via INTRAVENOUS

## 2022-10-04 MED ORDER — DEXAMETHASONE SODIUM PHOSPHATE 10 MG/ML IJ SOLN
10.0000 mg | Freq: Once | INTRAMUSCULAR | Status: AC
  Administered 2022-10-04: 10 mg via INTRAVENOUS
  Filled 2022-10-04: qty 1

## 2022-10-04 NOTE — ED Triage Notes (Signed)
Pt to ED for sore throat since Wed (3d). Painful to talk. Complains also of R neck pain, voice sounds slightly muffled..   Pt went to [facility] this AM, sent to ED for suspicion of peritonsillar abscess. Saw PCP on Wednesday, dx with group A strep and has been on penicillin since then with no relief.   PA at bedside, pt will have bloodwork but no lactic at this time. R throat red and swollen on exam.

## 2022-10-04 NOTE — ED Provider Notes (Signed)
[facility] Provider Note    Event Date/Time   First MD Initiated Contact with Patient 10/04/22 1504     (approximate)   History   Sore Throat   HPI  Mandy Romero is a 26 y.o. female who presents today for evaluation of sore throat.  Patient reports that her symptoms began 4 days ago but worsened last night.  She reports that she has had a change in her voice and has had a hard time swallowing due to pain.  She is able to drink water and swallow her own secretions.  She denies nuchal rigidity.  She denies cough.  She reports that she was diagnosed with strep throat 4 days ago.  She has been taking antibiotics without improvement of her symptoms.  She is unsure if she has had a fever.  No chills.  Patient Active Problem List   Diagnosis Date Noted   Ovarian cyst 07/30/2020   Pelvic adhesions 07/30/2020   Pelvic pain in female 07/30/2020   Vaginal bleeding in pregnancy, third trimester 01/23/2017   Migraine 01/08/2017   Abnormal glucose tolerance in pregnancy 12/25/2016   Pelvic pain in pregnancy, antepartum, third trimester 12/21/2016   Headache in pregnancy 11/27/2016   H/O preterm delivery, currently pregnant, third trimester 09/01/2016   Late prenatal care affecting pregnancy in second trimester 09/01/2016   Obesity (BMI 30-39.9) 08/30/2016   Maternal varicella, non-immune 08/30/2016          Physical Exam   Triage Vital Signs: ED Triage Vitals  Enc Vitals Group     BP 10/04/22 1430 120/79     Pulse Rate 10/04/22 1430 90     Resp 10/04/22 1430 20     Temp 10/04/22 1430 100.1 F (37.8 C)     Temp Source 10/04/22 1430 Oral     SpO2 10/04/22 1430 99 %     Weight 10/04/22 1432 150 lb (68 kg)     Height 10/04/22 1432 5\' 3"  (1.6 m)     Head Circumference --      Peak Flow --      Pain Score 10/04/22 1431 10     Pain Loc --      Pain Edu? --      Excl. in [place]? --     Most recent vital signs: Vitals:   10/04/22 1430  BP: 120/79   Pulse: 90  Resp: 20  Temp: 100.1 F (37.8 C)  SpO2: 99%    Physical Exam Vitals and nursing note reviewed.  Constitutional:      General: Awake and alert.  Mildly uncomfortable appearing, able to lay flat    Appearance: Normal appearance. The patient is normal weight.  HENT:     Head: Normocephalic and atraumatic.     Mouth: Mucous membranes are moist. Uvula midline.  3+ tonsils bilaterally, no tonsillar exudate.  No soft palate fluctuance.  No trismus.  Muffled/hot potato voice change.  No sublingual swelling.  Right-sided tender cervical lymphadenopathy.  No nuchal rigidity.  No drooling Eyes:     General: PERRL. Normal EOMs        Right eye: No discharge.        Left eye: No discharge.     Conjunctiva/sclera: Conjunctivae normal.  Cardiovascular:     Rate and Rhythm: Normal rate and regular rhythm.     Pulses: Normal pulses.     Heart sounds: Normal heart sounds Pulmonary:     Effort: Pulmonary effort is normal.  No respiratory distress.     Breath sounds: Normal breath sounds.  Abdominal:     Abdomen is soft. There is no abdominal tenderness. No rebound or guarding. No distention. Musculoskeletal:        General: No swelling. Normal range of motion.     Cervical back: Normal range of motion and neck supple.  Skin:    General: Skin is warm and dry.     Capillary Refill: Capillary refill takes less than 2 seconds.     Findings: No rash.  Neurological:     Mental Status: The patient is awake and alert.      ED Results / Procedures / Treatments   Labs (all labs ordered are listed, but only abnormal results are displayed) Labs Reviewed  COMPREHENSIVE METABOLIC PANEL - Abnormal; Notable for the following components:      Result Value   CO2 20 (*)    Creatinine, Ser 0.38 (*)    Total Protein 8.4 (*)    AST 14 (*)    All other components within normal limits  GROUP A STREP BY PCR  CBC WITH DIFFERENTIAL/PLATELET  POC URINE PREG, ED     EKG     RADIOLOGY I  independently reviewed and interpreted imaging and agree with radiologists findings.    PROCEDURES:  Critical Care performed:   Procedures   MEDICATIONS ORDERED IN ED: Medications  Ampicillin-Sulbactam (UNASYN) 3 g in sodium chloride 0.9 % 100 mL IVPB (0 g Intravenous Stopped 10/04/22 1820)  dexamethasone (DECADRON) injection 10 mg (10 mg Intravenous Given 10/04/22 1624)  ketorolac (TORADOL) 15 MG/ML injection 15 mg (15 mg Intravenous Given 10/04/22 1624)  iohexol (OMNIPAQUE) 300 MG/ML solution 100 mL (100 mLs Intravenous Contrast Given 10/04/22 1701)     IMPRESSION / MDM / ASSESSMENT AND PLAN / ED COURSE  I reviewed the triage vital signs and the nursing notes.   Differential diagnosis includes, but is not limited to, peritonsillar abscess, retropharyngeal abscess, tonsillar abscess, strep pharyngitis.  Patient is awake and alert, hemodynamically stable and afebrile.  She has muffled voice, and right-sided lymphadenopathy, though no trismus, no drooling.  She is able to tolerate her secretions and swallow water.  Labs obtained in triage are overall reassuring.  Patient was treated symptomatically with IV Decadron, Unasyn, and Toradol.  CT neck with contrast reveals 2.2cm PTA.  I discussed this with Dr. Tacey Ruiz, ENT on-call, who reports that given that she does not have trismus, he does not feel that this is a true peritonsillar abscess, and recommends outpatient treatment with Augmentin and prednisone Dosepak.  He recommends that if she has worsening symptoms then to return the emergency department tomorrow.  He has agreed to arrange follow-up for her for Monday and he was given the patient's information.  Patient was able to drink a whole cup of water without difficulty.  We discussed very strict return precautions, medication management, and outpatient follow-up.  She was given a work note.  Patient understands and agrees with plan.  She was discharged in stable condition.   Patient's  presentation is most consistent with acute complicated illness / injury requiring diagnostic workup.    Clinical Course as of 10/04/22 1849  Sat Oct 04, 2022  1740 Discussed with radiology, patient has a 2cm PTA [JP]  1748 Discussed with Dr. Bruni Callas who recommends augmentin, medrol dose pack and does not think that this requires drainage and if not improved tomorrow, return to ED [JP]    Clinical Course User Index [  JP] Jehan Bonano, Herb Grays, PA-C     FINAL CLINICAL IMPRESSION(S) / ED DIAGNOSES   Final diagnoses:  Phlegmon  Strep throat  Peritonsillar abscess     Rx / DC Orders   ED Discharge Orders          Ordered    amoxicillin-clavulanate (AUGMENTIN) 875-125 MG tablet  2 times daily        10/04/22 1755    predniSONE (STERAPRED UNI-PAK 21 TAB) 10 MG (21) TBPK tablet        10/04/22 1755             Note:  This document was prepared using Dragon voice recognition software and may include unintentional dictation errors.    Keturah Shavers 10/04/22 1849    Pilar Jarvis, MD 10/04/22 Mikle Bosworth

## 2022-10-04 NOTE — Discharge Instructions (Signed)
Your CT scan shows a developing abscess.  This was discussed with the ear nose and throat doctor on-call who recommends that you change her antibiotic to the one that was prescribed today, in addition to the steroids that were prescribed.  Start the steroids tomorrow as you are given steroids today in the emergency department.  If your symptoms or not improved tomorrow, please return to the emergency department.  It was a pleasure caring for you today.

## 2022-10-04 NOTE — ED Provider Triage Note (Signed)
Emergency Medicine Provider Triage Evaluation Note  [facility] , a 26 y.o. female  was evaluated in triage.  Pt complains of worse strep throat.  Taking penicillin and was seen at [facility] and told to come to ED for peritonsillar abscess.    Review of Systems  Positive: Decrease po intake Negative:   Physical Exam  BP 120/79   Pulse 90   Temp 100.1 F (37.8 C) (Oral)   Resp 20   Ht 5\' 3"  (1.6 m)   Wt 68 kg   SpO2 99%   BMI 26.57 kg/m  Gen:   Awake, no distress   Voice muffled.  Resp:  Normal effort  MSK:   Moves extremities without difficulty  Other:  Right peritonsillar abscess present.  Maintaining secretions.    Medical Decision Making  Medically screening exam initiated at 2:33 PM.  Appropriate orders placed.  Carlisa Damaya Channing was informed that the remainder of the evaluation will be completed by another provider, this initial triage assessment does not replace that evaluation, and the importance of remaining in the ED until their evaluation is complete.     Merilyn Baba, PA-C 10/04/22 1436

## 2022-10-11 ENCOUNTER — Other Ambulatory Visit: Payer: Self-pay

## 2022-10-11 DIAGNOSIS — J101 Influenza due to other identified influenza virus with other respiratory manifestations: Secondary | ICD-10-CM | POA: Diagnosis not present

## 2022-10-11 DIAGNOSIS — Z1152 Encounter for screening for COVID-19: Secondary | ICD-10-CM | POA: Diagnosis not present

## 2022-10-11 DIAGNOSIS — R509 Fever, unspecified: Secondary | ICD-10-CM | POA: Diagnosis present

## 2022-10-11 LAB — CBC WITH DIFFERENTIAL/PLATELET
Abs Immature Granulocytes: 0.05 10*3/uL (ref 0.00–0.07)
Basophils Absolute: 0 10*3/uL (ref 0.0–0.1)
Basophils Relative: 1 %
Eosinophils Absolute: 0 10*3/uL (ref 0.0–0.5)
Eosinophils Relative: 0 %
HCT: 45.8 % (ref 36.0–46.0)
Hemoglobin: 15.1 g/dL — ABNORMAL HIGH (ref 12.0–15.0)
Immature Granulocytes: 1 %
Lymphocytes Relative: 15 %
Lymphs Abs: 0.7 10*3/uL (ref 0.7–4.0)
MCH: 28.7 pg (ref 26.0–34.0)
MCHC: 33 g/dL (ref 30.0–36.0)
MCV: 86.9 fL (ref 80.0–100.0)
Monocytes Absolute: 0.3 10*3/uL (ref 0.1–1.0)
Monocytes Relative: 7 %
Neutro Abs: 3.3 10*3/uL (ref 1.7–7.7)
Neutrophils Relative %: 76 %
Platelets: 284 10*3/uL (ref 150–400)
RBC: 5.27 MIL/uL — ABNORMAL HIGH (ref 3.87–5.11)
RDW: 12.5 % (ref 11.5–15.5)
WBC: 4.4 10*3/uL (ref 4.0–10.5)
nRBC: 0 % (ref 0.0–0.2)

## 2022-10-11 LAB — COMPREHENSIVE METABOLIC PANEL
ALT: 25 U/L (ref 0–44)
AST: 25 U/L (ref 15–41)
Albumin: 4.1 g/dL (ref 3.5–5.0)
Alkaline Phosphatase: 67 U/L (ref 38–126)
Anion gap: 10 (ref 5–15)
BUN: 10 mg/dL (ref 6–20)
CO2: 19 mmol/L — ABNORMAL LOW (ref 22–32)
Calcium: 8.7 mg/dL — ABNORMAL LOW (ref 8.9–10.3)
Chloride: 104 mmol/L (ref 98–111)
Creatinine, Ser: 0.65 mg/dL (ref 0.44–1.00)
GFR, Estimated: >60 mL/min (ref >60)
Glucose, Bld: 127 mg/dL — ABNORMAL HIGH (ref 70–99)
Potassium: 3.9 mmol/L (ref 3.5–5.1)
Sodium: 133 mmol/L — ABNORMAL LOW (ref 135–145)
Total Bilirubin: 0.6 mg/dL (ref 0.3–1.2)
Total Protein: 8.5 g/dL — ABNORMAL HIGH (ref 6.5–8.1)

## 2022-10-11 LAB — LACTIC ACID, PLASMA: Lactic Acid, Venous: 1.6 mmol/L (ref 0.5–1.9)

## 2022-10-11 MED ORDER — ACETAMINOPHEN 325 MG PO TABS
650.0000 mg | ORAL_TABLET | Freq: Once | ORAL | Status: AC | PRN
  Administered 2022-10-11: 650 mg via ORAL
  Filled 2022-10-11: qty 2

## 2022-10-11 NOTE — ED Notes (Signed)
First Nurse Note: Pt arrives via POV from home for sore throat. Pt was dx with strep throat and is on day 7 out of 10 on antibiotics but is still experiencing sx.

## 2022-10-11 NOTE — ED Triage Notes (Signed)
Pt arrives via POV with CC of throat pain and fever that has been ongoing since Strep A diagnosis on 10/01/22. Pt was prescribed penicillin and later amoxicillin but has not had any relief. Oral temp 103.1 - last ibuprofen taken at 1500.

## 2022-10-12 ENCOUNTER — Emergency Department: Payer: Medicaid Other

## 2022-10-12 ENCOUNTER — Emergency Department
Admission: EM | Admit: 2022-10-12 | Discharge: 2022-10-12 | Disposition: A | Discharge status: Home / Self Care | Payer: Medicaid Other | Attending: Emergency Medicine | Admitting: Emergency Medicine

## 2022-10-12 ENCOUNTER — Encounter: Payer: Self-pay | Admitting: Radiology

## 2022-10-12 DIAGNOSIS — J101 Influenza due to other identified influenza virus with other respiratory manifestations: Secondary | ICD-10-CM

## 2022-10-12 LAB — RESP PANEL BY RT-PCR (RSV, FLU A&B, COVID)  RVPGX2
Influenza A by PCR: POSITIVE — AB
Influenza B by PCR: NEGATIVE
Resp Syncytial Virus by PCR: NEGATIVE
SARS Coronavirus 2 by RT PCR: NEGATIVE

## 2022-10-12 MED ORDER — LACTATED RINGERS IV BOLUS
1000.0000 mL | Freq: Once | INTRAVENOUS | Status: AC
  Administered 2022-10-12: 1000 mL via INTRAVENOUS

## 2022-10-12 MED ORDER — IOHEXOL 300 MG/ML  SOLN
75.0000 mL | Freq: Once | INTRAMUSCULAR | Status: AC | PRN
  Administered 2022-10-12: 75 mL via INTRAVENOUS

## 2022-10-12 NOTE — Discharge Instructions (Signed)
Please complete the full course of antibiotics you are prescribed for your tonsil infection.  However the reason for your persistent fever tonight is that you have influenza (the flu).  Your body needs time to get better.  Drink plenty of fluids, take over-the-counter ibuprofen and Tylenol according to label instructions, and you should be feeling better within 7 to 10 days.  Follow-up with your regular doctor at the next available opportunity or as needed.  Return to the emergency department if you develop new or worsening symptoms that concern you.

## 2022-10-12 NOTE — ED Provider Notes (Signed)
[facility] Provider Note    Event Date/Time   First MD Initiated Contact with Patient 10/12/22 0133     (approximate)   History   Fever   HPI  Mandy Romero is a 26 y.o. female who presents for persistent fevers and sore throat.  She was first seen in person urgent care and then the emergency department about 8 days ago with sore throat and fever.  She was diagnosed with tonsillitis but then followed up again and had a CT scan of the neck concerning for an early developing peritonsillar abscess.  The case was discussed with ENT who recommended a course of Augmentin and close follow-up.  The patient says that she was feeling better and did not feel as swollen and is able to swallow easier, but she is continue to have very high fevers.  She feels ill in general and still has a sore throat even though it feels a little bit better than before.  However she said that the pain radiates to the back of her throat and neck on the right side which is the side that was the most painful before.  She is able to swallow and breathe without difficulty.  Her fever has been as high as 103 at home, which it also was at triage.  She said that ibuprofen and Tylenol do not help.  She is still taking Augmentin.  She had a short course of steroids as well as per ENT recommendations.  Initially she was tested for influenza and COVID and those were negative but she has not been retested.  She does have a substantial amount of nasal congestion in addition to the other symptoms.     Physical Exam   Triage Vital Signs: ED Triage Vitals  Enc Vitals Group     BP 10/11/22 2043 132/72     Pulse Rate 10/11/22 2043 (!) 109     Resp 10/11/22 2043 (!) 23     Temp 10/11/22 2043 (!) 103.1 F (39.5 C)     Temp Source 10/11/22 2043 Oral     SpO2 10/11/22 2043 94 %     Weight 10/11/22 2046 86.2 kg (190 lb)     Height 10/11/22 2046 1.524 m (5')     Head Circumference --      Peak Flow --       Pain Score 10/11/22 2046 9     Pain Loc --      Pain Edu? --      Excl. in [place]? --     Most recent vital signs: Vitals:   10/12/22 0131 10/12/22 0437  BP: 110/65 116/74  Pulse: 85 64  Resp: 18 16  Temp: 98.2 F (36.8 C) 98.1 F (36.7 C)  SpO2: 94% 95%     General: Awake, appears ill as if from a viral illness but nontoxic. HEENT: Erythematous oropharynx with tonsillar hypertrophy, more notable on the right with at least some degree of what could be peritonsillar swelling.  No airway compromise.  Tender to palpation but without palpable induration or lymphadenopathy beneath the mandible and no cervical lymphadenopathy or induration.  Normal voice, not "hot potato" or hoarse.  Tolerating secretions well. CV:  Good peripheral perfusion.  No tachycardia, regular rhythm. Resp:  Normal effort.  Lungs are clear to auscultation. Abd:  No distention.  No tenderness to palpation.   ED Results / Procedures / Treatments   Labs (all labs ordered are listed, but only abnormal  results are displayed) Labs Reviewed  RESP PANEL BY RT-PCR (RSV, FLU A&B, COVID)  RVPGX2 - Abnormal; Notable for the following components:      Result Value   Influenza A by PCR POSITIVE (*)    All other components within normal limits  CBC WITH DIFFERENTIAL/PLATELET - Abnormal; Notable for the following components:   RBC 5.27 (*)    Hemoglobin 15.1 (*)    All other components within normal limits  COMPREHENSIVE METABOLIC PANEL - Abnormal; Notable for the following components:   Sodium 133 (*)    CO2 19 (*)    Glucose, Bld 127 (*)    Calcium 8.7 (*)    Total Protein 8.5 (*)    All other components within normal limits  CULTURE, BLOOD (SINGLE)  LACTIC ACID, PLASMA     RADIOLOGY I viewed and interpreted the patient's CT neck soft tissue study.  I see some swelling on the right side around the area that was concerning for developing peritonsillar abscess previous slight, but it seems to be diminishing in  size.  Radiology confirmed that it is improved from prior.    PROCEDURES:  Critical Care performed: No  Procedures   MEDICATIONS ORDERED IN ED: Medications  acetaminophen (TYLENOL) tablet 650 mg (650 mg Oral Given 10/11/22 2056)  lactated ringers bolus 1,000 mL (0 mLs Intravenous Stopped 10/12/22 0437)  iohexol (OMNIPAQUE) 300 MG/ML solution 75 mL (75 mLs Intravenous Contrast Given 10/12/22 0259)     IMPRESSION / MDM / ASSESSMENT AND PLAN / ED COURSE  I reviewed the triage vital signs and the nursing notes.                              Differential diagnosis includes, but is not limited to, persistent or worsening peritonsillar abscess, other deep neck space infection, viral illness such as influenza or COVID, epiglottitis.  Patient's presentation is most consistent with acute presentation with potential threat to life or bodily function.  Labs/studies ordered: CBC with differential, CMP, single blood culture, lactic acid, respiratory viral panel, CT soft tissue neck with contrast.  I have verified in the medical record that the patient had a developing peritonsillar abscess on her prior scan.  Given her persistent fevers, documented as high as 103.1 here in the emergency department but which improved with antipyretics, I am concerned about the possibility of a persistent abscess in spite of oral antibiotics.  Overall clinically she said that she feels a little bit better in terms of the swelling, but she could still have a deep neck space infection.  She has no leukocytosis and normal CMP.  Blood cultures pending.  I ordered the respiratory viral panel to see if she has a new diagnosis of influenza which is somewhat epidemic right now in the community.    Clinical Course as of 10/12/22 3570  Wynelle Link Oct 12, 2022  1779 CT Soft Tissue Neck W Contrast I viewed and interpreted the patient's CT soft tissue neck as documented above.  Improved area infection.  Meanwhile, the patient's  influenza A test came back positive.  This is the cause for her persistent fever and general malaise.  I let her know to continue her course of antibiotics until done and to take over-the-counter ibuprofen Tylenol, fluids, etc.  I had my usual influenza discussion.  The patient understands and agrees with the plan.  I gave my usual and customary return precautions. [CF]    Clinical  Course User Index [CF] Loleta Rose, MD     FINAL CLINICAL IMPRESSION(S) / ED DIAGNOSES   Final diagnoses:  Influenza A     Rx / DC Orders   ED Discharge Orders     None        Note:  This document was prepared using Dragon voice recognition software and may include unintentional dictation errors.   Loleta Rose, MD 10/12/22 [phone]

## 2022-10-16 ENCOUNTER — Observation Stay
Admission: EM | Admit: 2022-10-16 | Discharge: 2022-10-17 | Disposition: A | Discharge status: Home / Self Care | Payer: Medicaid Other | Attending: Internal Medicine | Admitting: Internal Medicine

## 2022-10-16 ENCOUNTER — Encounter: Payer: Self-pay | Admitting: Internal Medicine

## 2022-10-16 ENCOUNTER — Emergency Department: Payer: Medicaid Other

## 2022-10-16 ENCOUNTER — Other Ambulatory Visit: Payer: Self-pay

## 2022-10-16 DIAGNOSIS — D72829 Elevated white blood cell count, unspecified: Secondary | ICD-10-CM | POA: Insufficient documentation

## 2022-10-16 DIAGNOSIS — E876 Hypokalemia: Secondary | ICD-10-CM | POA: Insufficient documentation

## 2022-10-16 DIAGNOSIS — Z6839 Body mass index (BMI) 39.0-39.9, adult: Secondary | ICD-10-CM | POA: Insufficient documentation

## 2022-10-16 DIAGNOSIS — K651 Peritoneal abscess: Secondary | ICD-10-CM

## 2022-10-16 DIAGNOSIS — Z79899 Other long term (current) drug therapy: Secondary | ICD-10-CM | POA: Diagnosis not present

## 2022-10-16 DIAGNOSIS — J029 Acute pharyngitis, unspecified: Secondary | ICD-10-CM | POA: Diagnosis present

## 2022-10-16 DIAGNOSIS — E669 Obesity, unspecified: Secondary | ICD-10-CM | POA: Diagnosis not present

## 2022-10-16 DIAGNOSIS — J36 Peritonsillar abscess: Principal | ICD-10-CM | POA: Insufficient documentation

## 2022-10-16 LAB — CULTURE, BLOOD (SINGLE)
Culture: NO GROWTH
Special Requests: ADEQUATE

## 2022-10-16 LAB — CBC
HCT: 42 % (ref 36.0–46.0)
Hemoglobin: 13.7 g/dL (ref 12.0–15.0)
MCH: 28.2 pg (ref 26.0–34.0)
MCHC: 32.6 g/dL (ref 30.0–36.0)
MCV: 86.6 fL (ref 80.0–100.0)
Platelets: 353 10*3/uL (ref 150–400)
RBC: 4.85 MIL/uL (ref 3.87–5.11)
RDW: 12.6 % (ref 11.5–15.5)
WBC: 11.6 10*3/uL — ABNORMAL HIGH (ref 4.0–10.5)
nRBC: 0 % (ref 0.0–0.2)

## 2022-10-16 LAB — BASIC METABOLIC PANEL
Anion gap: 8 (ref 5–15)
BUN: 8 mg/dL (ref 6–20)
CO2: 27 mmol/L (ref 22–32)
Calcium: 9 mg/dL (ref 8.9–10.3)
Chloride: 104 mmol/L (ref 98–111)
Creatinine, Ser: 0.62 mg/dL (ref 0.44–1.00)
GFR, Estimated: >60 mL/min (ref >60)
Glucose, Bld: 92 mg/dL (ref 70–99)
Potassium: 3.4 mmol/L — ABNORMAL LOW (ref 3.5–5.1)
Sodium: 139 mmol/L (ref 135–145)

## 2022-10-16 MED ORDER — LACTATED RINGERS IV SOLN: INTRAVENOUS | Status: DC

## 2022-10-16 MED ORDER — BENZOCAINE 20 % MT AERO
INHALATION_SPRAY | Freq: Once | OROMUCOSAL | Status: DC
  Filled 2022-10-16: qty 57

## 2022-10-16 MED ORDER — ACETAMINOPHEN 325 MG PO TABS: 650.0000 mg | ORAL_TABLET | Freq: Four times a day (QID) | ORAL | Status: DC | PRN

## 2022-10-16 MED ORDER — METOCLOPRAMIDE HCL 5 MG/ML IJ SOLN: 10.0000 mg | Freq: Once | INTRAMUSCULAR | Status: DC

## 2022-10-16 MED ORDER — DEXAMETHASONE SODIUM PHOSPHATE 10 MG/ML IJ SOLN
10.0000 mg | Freq: Once | INTRAMUSCULAR | Status: AC
  Administered 2022-10-17: 10 mg via INTRAVENOUS
  Filled 2022-10-16: qty 1

## 2022-10-16 MED ORDER — SODIUM CHLORIDE 0.9 % IV BOLUS
1000.0000 mL | Freq: Once | INTRAVENOUS | Status: AC
  Administered 2022-10-16: 1000 mL via INTRAVENOUS

## 2022-10-16 MED ORDER — ONDANSETRON HCL 4 MG/2ML IJ SOLN: 4.0000 mg | Freq: Four times a day (QID) | INTRAMUSCULAR | Status: DC | PRN

## 2022-10-16 MED ORDER — IOHEXOL 300 MG/ML  SOLN
100.0000 mL | Freq: Once | INTRAMUSCULAR | Status: AC | PRN
  Administered 2022-10-16: 100 mL via INTRAVENOUS

## 2022-10-16 MED ORDER — SODIUM CHLORIDE 0.9 % IV SOLN
3.0000 g | Freq: Four times a day (QID) | INTRAVENOUS | Status: DC
  Administered 2022-10-16 – 2022-10-17 (×3): 3 g via INTRAVENOUS
  Filled 2022-10-16 (×3): qty 8

## 2022-10-16 MED ORDER — SENNOSIDES-DOCUSATE SODIUM 8.6-50 MG PO TABS: 1.0000 | ORAL_TABLET | Freq: Every evening | ORAL | Status: DC | PRN

## 2022-10-16 MED ORDER — KETOROLAC TROMETHAMINE 15 MG/ML IJ SOLN
15.0000 mg | Freq: Once | INTRAMUSCULAR | Status: AC
  Administered 2022-10-16: 15 mg via INTRAVENOUS
  Filled 2022-10-16: qty 1

## 2022-10-16 MED ORDER — SODIUM CHLORIDE 0.9 % IV SOLN
3.0000 g | Freq: Once | INTRAVENOUS | Status: AC
  Administered 2022-10-16: 3 g via INTRAVENOUS
  Filled 2022-10-16: qty 8

## 2022-10-16 MED ORDER — DEXAMETHASONE SODIUM PHOSPHATE 10 MG/ML IJ SOLN
10.0000 mg | Freq: Once | INTRAMUSCULAR | Status: AC
  Administered 2022-10-16: 10 mg via INTRAVENOUS
  Filled 2022-10-16: qty 1

## 2022-10-16 MED ORDER — ACETAMINOPHEN 650 MG RE SUPP: 650.0000 mg | Freq: Four times a day (QID) | RECTAL | Status: DC | PRN

## 2022-10-16 MED ORDER — ONDANSETRON HCL 4 MG PO TABS: 4.0000 mg | ORAL_TABLET | Freq: Four times a day (QID) | ORAL | Status: DC | PRN

## 2022-10-16 MED ORDER — LIDOCAINE-EPINEPHRINE 1 %-1:100000 IJ SOLN
30.0000 mL | Freq: Once | INTRAMUSCULAR | Status: AC
  Administered 2022-10-16: 30 mL via INTRADERMAL
  Filled 2022-10-16: qty 2

## 2022-10-16 MED ORDER — PHENOL 1.4 % MT LIQD
1.0000 | OROMUCOSAL | Status: DC | PRN
  Filled 2022-10-16: qty 177

## 2022-10-16 NOTE — ED Triage Notes (Signed)
Pt here with c/o of having strep throat on 12/13, pt states she took a full 10 day course of ABX and states throat is still swollen. Pt denies fevers at this time.

## 2022-10-16 NOTE — Assessment & Plan Note (Addendum)
-   Status post incision and drainage by ENT - Continue Unasyn per pharmacy - Decadron 10 mg IV daily, 1 dose ordered for 10/17/2022; AM team to prescribed steroid taper as appropriate - Patient to follow-up with ENT outpatient office next week to discuss possible tonsillectomy

## 2022-10-16 NOTE — Hospital Course (Signed)
Mandy Romero is a 26 year old female with history of peritonsillar abscess, ovarian cyst, preterm labor, migraines, laparoscopic ovarian cystectomy, who presents emergency department for chief concerns of sore throat.  Initial vitals in the ED showed temperature of 99, respiration rate of 13, heart rate 74, blood pressure 121/79, SpO2 95% on room air  Serum sodium 139, potassium 3.4, chloride 104, bicarb 27, BUN of 8, serum creatinine 0.62, eGFR greater than 60, 92, WBC 11.6, hemoglobin 13.7, platelets of 353.  ED treatment: Decadron 10 mg IV one-time dose, Toradol 15 mg IV, lidocaine injection, Unasyn 3 g IV, sodium chloride 1 L bolus.

## 2022-10-16 NOTE — Consult Note (Signed)
[address], Mandy Romero [id number] 02-22-1996 Sandi Mealy, MD  Reason for Consult: Peritonsillar abscess Requesting Physician: Corena Herter, MD Consulting Physician: Sandi Mealy, MD  HPI: This 26 y.o. year old female was admitted on 10/16/2022 for sore throat.  She has a history of prior peritonsillar abscess, with 1 episode requiring I&D a few years back.  This past month she was in the emergency room for an early right peritonsillar abscess that was managed medically, followed up subsequently with persistent sore throat, but CT at that time did not demonstrate an abscess and showed improvement from previous.  Now she has returned to the ER with a definite peritonsillar abscess on the right.  She is having difficulty swallowing and sleeping because of this.  Some intermittent fevers.  Allergies: No Known Allergies  Medications: (Not in a hospital admission) .  Current Facility-Administered Medications  Medication Dose Route Frequency Provider Last Rate Last Admin   lidocaine-EPINEPHrine (XYLOCAINE W/EPI) 1 %-1:100000 (with pres) injection 30 mL  30 mL Intradermal Once Mumma, Carollee Herter, MD       Current Outpatient Medications  Medication Sig Dispense Refill   Norethindrone-Ethinyl Estradiol-Fe Biphas (LO LOESTRIN FE) 1 MG-10 MCG / 10 MCG tablet Take 1 tablet by mouth daily. 84 tablet 3   predniSONE (STERAPRED UNI-PAK 21 TAB) 10 MG (21) TBPK tablet Take 6 tablets at once by mouth on day 1 and then decrease by 1 tablet for each subsequent day 21 tablet 0    PMH:  Past Medical History:  Diagnosis Date   Migraine    Ovarian cyst    Ovarian cyst    Peritonsillar abscess    Preterm labor     Fam Hx:  Family History  Problem Relation Age of Onset   Diabetes Father    Hypertension Father    Cancer Maternal Aunt    Gestational diabetes Sister     Soc Hx:  Social History   Socioeconomic History   Marital status: Single    Spouse name: Not on file   Number of children: Not on  file   Years of education: Not on file   Highest education level: Not on file  Occupational History   Not on file  Tobacco Use   Smoking status: Never   Smokeless tobacco: Never  Vaping Use   Vaping Use: Never used  Substance and Sexual Activity   Alcohol use: Not Currently    Comment: rarely   Drug use: Never   Sexual activity: Yes    Birth control/protection: Pill, Surgical    Comment: oophorectomy  Other Topics Concern   Not on file  Social History Narrative   Not on file   Social Determinants of Health   Financial Resource Strain: Not on file  Food Insecurity: Not on file  Transportation Needs: Not on file  Physical Activity: Not on file  Stress: Not on file  Social Connections: Not on file  Intimate Partner Violence: Not on file    PSH:  Past Surgical History:  Procedure Laterality Date   CHOLECYSTECTOMY  05/2012   LAPAROSCOPIC LYSIS OF ADHESIONS  07/30/2020   Procedure: LAPAROSCOPIC LYSIS OF PELVIC ADHESIONS;  Surgeon: Linzie Collin, MD;  Location: [facility];  Service: Gynecology;;   LAPAROSCOPIC OVARIAN CYSTECTOMY Left 07/30/2020   Procedure: LAPAROSCOPIC OVARIAN CYSTECTOMY;  Surgeon: Linzie Collin, MD;  Location: [facility];  Service: Gynecology;  Laterality: Left;   LAPAROSCOPIC OVARIAN CYSTECTOMY Left 02/20/2021   Procedure: LAPAROSCOPIC OVARIAN CYSTECTOMY;  Surgeon: Logan Bores,  Ellsworth Lennox, MD;  Location: [facility];  Service: Gynecology;  Laterality: Left;   LAPAROSCOPY N/A 02/20/2021   Procedure: LAPAROSCOPY DIAGNOSTIC;  Surgeon: Linzie Collin, MD;  Location: [facility];  Service: Gynecology;  Laterality: N/A;  . Procedures since admission: No admission procedures for hospital encounter.  ROS: Review of systems normal other than 12 systems except per HPI.  PHYSICAL EXAM Vitals:  Vitals:   10/16/22 1421  BP: 121/79  Pulse: 74  Resp: 13  Temp: 99 F (37.2 C)  SpO2: 95%  .  General: Well-developed, Well-nourished in no acute distress Mood: Mood  and affect well adjusted, pleasant and cooperative. Orientation: Grossly alert and oriented. Vocal Quality: No hoarseness. Communicates verbally. head and Face: NCAT. No facial asymmetry. No visible skin lesions. No significant facial scars. No tenderness with sinus percussion. Facial strength normal and symmetric. Ears: External ears with normal landmarks, no lesions. External auditory canals free of infection, cerumen impaction or lesions. Tympanic membranes intact with good landmarks and normal mobility on pneumatic otoscopy. No middle ear effusion. Hearing: Speech reception grossly normal. Nose: External nose normal with midline dorsum and no lesions or deformity. Nasal Cavity reveals essentially midline septum with normal inferior turbinates. No significant mucosal congestion or erythema. Nasal secretions are minimal and clear. No polyps seen on anterior rhinoscopy. Oral Cavity/ Oropharynx: Lips are normal with no lesions. Teeth no frank dental caries. Gingiva healthy with no lesions or gingivitis.  The right tonsil is swollen with swelling extending up onto the soft palate and slight uvular shift.[name] Laryngoscopy/Nasopharyngoscopy: Visualization of the larynx, hypopharynx and nasopharynx is not possible in this setting with routine examination. Neck: Supple and symmetric with no palpable masses, tenderness or crepitance. The trachea is midline. Thyroid gland is soft, nontender and symmetric with no masses or enlargement. Parotid and submandibular glands are soft, nontender and symmetric, without masses. Lymphatic: Jugulodigastric adenopathy bilaterally without crepitance. Respiratory: Normal respiratory effort without labored breathing. Cardiovascular: Carotid pulse shows regular rate and rhythm Neurologic: Cranial Nerves II through XII are grossly intact. Eyes: Gaze and Ocular Motility are grossly normal. PERRLA. No visible nystagmus.  MEDICAL DECISION MAKING: Data Review:  Results  for orders placed or performed during the hospital encounter of 10/16/22 (from the past 48 hour(s))  CBC     Status: Abnormal   Collection Time: 10/16/22  3:01 PM  Result Value Ref Range   WBC 11.6 (H) 4.0 - 10.5 K/uL   RBC 4.85 3.87 - 5.11 MIL/uL   Hemoglobin 13.7 12.0 - 15.0 g/dL   HCT 73.2 20.2 - 54.2 %   MCV 86.6 80.0 - 100.0 fL   MCH 28.2 26.0 - 34.0 pg   MCHC 32.6 30.0 - 36.0 g/dL   RDW 70.6 23.7 - 62.8 %   Platelets 353 150 - 400 K/uL   nRBC 0.0 0.0 - 0.2 %    Comment: Performed at [facility], [address], [place], [place] [address]  Basic metabolic panel     Status: Abnormal   Collection Time: 10/16/22  3:01 PM  Result Value Ref Range   Sodium 139 135 - 145 mmol/L   Potassium 3.4 (L) 3.5 - 5.1 mmol/L   Chloride 104 98 - 111 mmol/L   CO2 27 22 - 32 mmol/L   Glucose, Bld 92 70 - 99 mg/dL    Comment: Glucose reference range applies only to samples taken after fasting for at least 8 hours.   BUN 8 6 - 20 mg/dL  Creatinine, Ser 0.62 0.44 - 1.00 mg/dL   Calcium 9.0 8.9 - 11.0 mg/dL   GFR, Estimated >31 >59 mL/min    Comment: (NOTE) Calculated using the CKD-EPI Creatinine Equation (2021)    Anion gap 8 5 - 15    Comment: Performed at [facility], [address], [place], [place] [address]  . CT Soft Tissue Neck W Contrast  Result Date: 10/16/2022 CLINICAL DATA:  Epiglottitis or tonsillitis suspected PTA EXAM: CT NECK WITH CONTRAST TECHNIQUE: Multidetector CT imaging of the neck was performed using the standard protocol following the bolus administration of intravenous contrast. RADIATION DOSE REDUCTION: This exam was performed according to the departmental dose-optimization program which includes automated exposure control, adjustment of the mA and/or kV according to patient size and/or use of iterative reconstruction technique. CONTRAST:  OMNIPAQUE IOHEXOL 300 MG/ML  SOLN COMPARISON:  None Available. FINDINGS: Pharynx and larynx: Approximally 2.6  x 2.2 x 3.3 cm peripherally enhancing fluid collection in the anterolateral right tonsil (for example see series 2, image 34 and series 6, image 56), compatible with tonsillar abscess. Mildly prominent tonsils, compatible with tonsillitis. Salivary glands: No inflammation, mass, or stone. Thyroid: Normal. Lymph nodes: Mildly prominent upper cervical chain nodes, nonspecific but likely reactive given the above findings. Vascular: Grossly patent. Limited assessment due to non arterial timing. Limited intracranial: Negative. Visualized orbits: Negative. Mastoids and visualized paranasal sinuses: Mild paranasal sinus mucosal thickening. No mastoid effusions. Skeleton: Reversal of the cervical lordosis, likely positional. Upper chest: Visualized lung apices are clear. IMPRESSION: Findings compatible with tonsillitis and 3.3 cm right palatine tonsillar abscess, described above. Electronically Signed   By: Feliberto Harts M.D.   On: 10/16/2022 15:44  .   PROCEDURE: Preoperative Diagnosis: Right peritonsillar abscess Postoperative Diagnosis: Same Procedure: Incision and drainage of right peritonsillar abscess Indications: Right peritonsillar abscess Findings: At least 3 cc of pus was drained, possibly more, from above the right tonsil Description of Procedure: After discussing procedure and risks  (primarily bleeding) with the patient, the throat anesthetized with topical Hurricane spray and the region around the right tonsil injected with 1% lidocaine with epinephrine, 1:100,000. An 18 gage needle was used to aspirate above the tonsil, aspirating approximately 1 cc of purulence.  The syringe could not hold suction to remove further purulence, which was freely flowing from the site, so this area was then opened with a 11 blade and a hemostat used to carefully bluntly open the peritonsillar space and drain further loculated purulence. The patient tolerated the procedure well.  She felt improvement in her sore  throat with removal of the purulence   ASSESSMENT: Right peritonsillar abscess  PLAN: The abscess was successfully drained.  Recommend overnight observation with IV antibiotics and Decadron, and hydration with IV fluids since she is not swallowing well.  Unasyn or clindamycin would be appropriate, and would recommend discharge as she improves in terms of her swallowing on clindamycin 300 mg p.o. 4 times daily for 10 days.  A steroid taper would also be reasonable, though she could just complete the taper that she apparently had on admission if she has any significant dosing remaining in her Dosepak.  She can follow-up in our office next week to make sure she is continuing to improve, and to discuss possible future tonsillectomy.   Sandi Mealy, MD 10/16/2022 5:20 PM

## 2022-10-16 NOTE — ED Provider Notes (Addendum)
[facility] Provider Note    Event Date/Time   First MD Initiated Contact with Patient 10/16/22 1418     (approximate)   History   Sore Throat   HPI  Mandy Romero is a 26 y.o. female presents to the emergency department with a sore throat.  On 12/18 patient was evaluated in the emergency department with a sore throat.  Ultimately was diagnosed with a right-sided PTA.  After discussion with ENT she did not have incision and drainage but was started on antibiotics.  Patient completed a 10-day course of Augmentin.  Return to the emergency department a couple of days ago for sore throat.  Had a repeat CT scan at that time.  Ultimately tested positive for influenza and had a CT scan with improvement of her PTA.  Recurrence of her sore throat over the past 2 days.  Denies any fever.  Difficulty swallowing.  Decreased p.o. intake.     Physical Exam   Triage Vital Signs: ED Triage Vitals  Enc Vitals Group     BP 10/16/22 1421 121/79     Pulse Rate 10/16/22 1421 74     Resp 10/16/22 1421 13     Temp 10/16/22 1421 99 F (37.2 C)     Temp Source 10/16/22 1421 Oral     SpO2 10/16/22 1421 95 %     Weight --      Height --      Head Circumference --      Peak Flow --      Pain Score 10/16/22 1416 9     Pain Loc --      Pain Edu? --      Excl. in GC? --     Most recent vital signs: Vitals:   10/16/22 1421  BP: 121/79  Pulse: 74  Resp: 13  Temp: 99 F (37.2 C)  SpO2: 95%    Physical Exam Constitutional:      Appearance: She is well-developed.  HENT:     Head: Atraumatic.     Mouth/Throat:     Pharynx: Pharyngeal swelling present.     Tonsils: Tonsillar abscess present. 4+ on the right. 3+ on the left.     Comments: Uvula deviated to the left Eyes:     Conjunctiva/sclera: Conjunctivae normal.  Cardiovascular:     Rate and Rhythm: Regular rhythm.  Pulmonary:     Effort: No respiratory distress.  Abdominal:     General: There is no  distension.  Musculoskeletal:        General: Normal range of motion.     Cervical back: Normal range of motion.  Skin:    General: Skin is warm.  Neurological:     Mental Status: She is alert. Mental status is at baseline.      IMPRESSION / MDM / ASSESSMENT AND PLAN / ED COURSE  I reviewed the triage vital signs and the nursing notes.  Differential diagnosis including tonsillitis, PTA, RPA   RADIOLOGY I independently reviewed imaging, my interpretation of imaging: CT scan with right-sided abscess.  CT scan with contrast showed findings concerning for tonsillitis and a 3.3 cm right palatine tonsillar abscess.   Labs (all labs ordered are listed, but only abnormal results are displayed) Labs interpreted as -  Leukocytosis  Labs Reviewed  CBC - Abnormal; Notable for the following components:      Result Value   WBC 11.6 (*)    All other components within normal  limits  BASIC METABOLIC PANEL - Abnormal; Notable for the following components:   Potassium 3.4 (*)    All other components within normal limits    Patient was treated with IV Decadron, IV fluids and IV Unasyn  Consulted ENT and discussed the patient's case with Dr. Willeen Cass who will evaluate the patient in the emergency department for incision and drainage.    5:35 PM  After incision and drainage with ENT recommended admission given that the patient is having difficulty tolerating p.o. and concern for tolerating her p.o. antibiotics.  Consulted hospitalist for admission for peritonsillar abscess.  PROCEDURES:  Critical Care performed: No  Procedures acute presentation with potential threat to life or bodily function. Patient's presentation is most consistent with acute presentation with potential threat to life or bodily function.   MEDICATIONS ORDERED IN ED: Medications  dexamethasone (DECADRON) injection 10 mg (10 mg Intravenous Given 10/16/22 1509)  ketorolac (TORADOL) 15 MG/ML injection 15 mg (15 mg  Intravenous Given 10/16/22 1509)  sodium chloride 0.9 % bolus 1,000 mL (0 mLs Intravenous Stopped 10/16/22 1613)  iohexol (OMNIPAQUE) 300 MG/ML solution 100 mL (100 mLs Intravenous Contrast Given 10/16/22 1529)  Ampicillin-Sulbactam (UNASYN) 3 g in sodium chloride 0.9 % 100 mL IVPB (0 g Intravenous Stopped 10/16/22 1714)  lidocaine-EPINEPHrine (XYLOCAINE W/EPI) 1 %-1:100000 (with pres) injection 30 mL (30 mLs Intradermal Given by Other 10/16/22 1730)    FINAL CLINICAL IMPRESSION(S) / ED DIAGNOSES   Final diagnoses:  Peritonsillar abscess     Rx / DC Orders   ED Discharge Orders     None        Note:  This document was prepared using Dragon voice recognition software and may include unintentional dictation errors.   Corena Herter, MD 10/16/22 [place] Alt    Corena Herter, MD 10/16/22 1735

## 2022-10-16 NOTE — Assessment & Plan Note (Addendum)
-   LR 125 mL/h 1 day ordered - Check magnesium and repeat BMP in the a.m.

## 2022-10-16 NOTE — H&P (Addendum)
History and Physical   Mandy Romero SAY:[id number] DOB: 12-Feb-1996 DOA: 10/16/2022  PCP: [facility]  Patient coming from: Home  I have personally briefly reviewed patient's old medical records in [place].  Chief Concern: Sore throat  HPI: [name] is a 26 year old female with history of peritonsillar abscess, ovarian cyst, preterm labor, migraines, laparoscopic ovarian cystectomy, who presents emergency department for chief concerns of sore throat.  Initial vitals in the ED showed temperature of 99, respiration rate of 13, heart rate 74, blood pressure 121/79, SpO2 95% on room air  Serum sodium 139, potassium 3.4, chloride 104, bicarb 27, BUN of 8, serum creatinine 0.62, eGFR greater than 60, 92, WBC 11.6, hemoglobin 13.7, platelets of 353.  ED treatment: Decadron 10 mg IV one-time dose, Toradol 15 mg IV, lidocaine injection, Unasyn 3 g IV, sodium chloride 1 L bolus. ------------------------- At bedside, she is able to tell me her name, age, current calendar year.   She reports her mid December, her throat started hurting. She went to ED, and the gave her antibiotics and steroids and she reports compliance. She denies known sick contacts.   She reports that her symptoms improved with antibiotic and steroid compliance.  She reports that her symptoms returned on 10/16/2022.  Social history: She lives at home with her husband and sons. She denies tobacco, etoh, and recreational drug use.  ROS: Constitutional: no weight change, no fever ENT/Mouth: no sore throat, no rhinorrhea, + dysphagia Eyes: no eye pain, no vision changes Cardiovascular: no chest pain, no dyspnea,  no edema, no palpitations Respiratory: no cough, no sputum, no wheezing Gastrointestinal: no nausea, no vomiting, no diarrhea, no constipation Genitourinary: no urinary incontinence, no dysuria, no hematuria Musculoskeletal: no arthralgias, no myalgias Skin: no skin  lesions, no pruritus, Neuro: + weakness, no loss of consciousness, no syncope Psych: no anxiety, no depression, + decrease appetite Heme/Lymph: no bruising, no bleeding  ED Course: Discussed with emergency medicine provider, patient requiring hospitalization for chief concerns of peritonsillar abscess status post I&D bedside.  Assessment/Plan  Principal Problem:   Peritoneal abscess ([place]) Active Problems:   Obesity (BMI 30-39.9)   Leukocytosis   Hypokalemia   Assessment and Plan:  * Peritoneal abscess ([employer] [facility]) - Status post incision and drainage by ENT - Continue Unasyn per pharmacy - Decadron 10 mg IV daily, 1 dose ordered for 10/17/2022; AM team to prescribed steroid taper as appropriate - Patient to follow-up with ENT outpatient office next week to discuss possible tonsillectomy  Hypokalemia - LR 125 mL/h 1 day ordered - Check magnesium and repeat BMP in the a.m.  Leukocytosis - Presumed secondary to right peritonsillar abscess - Repeat CBC in a.m.  Obesity (BMI 23-55.7) - This complicates overall care and prognosis.   Chart reviewed.   DVT prophylaxis: ted hose Code Status: full code Diet: clear liquids Family Communication: no Disposition Plan: pending clinical course Consults called: ENT Admission status: Telemetry medical, observation  Past Medical History:  Diagnosis Date   Migraine    Ovarian cyst    Ovarian cyst    Peritonsillar abscess    Preterm labor    Past Surgical History:  Procedure Laterality Date   CHOLECYSTECTOMY  05/2012   LAPAROSCOPIC LYSIS OF ADHESIONS  07/30/2020   Procedure: LAPAROSCOPIC LYSIS OF PELVIC ADHESIONS;  Surgeon: Harlin Heys, MD;  Location: [facility] ORS;  Service: Gynecology;;   LAPAROSCOPIC OVARIAN CYSTECTOMY Left 07/30/2020   Procedure: LAPAROSCOPIC OVARIAN CYSTECTOMY;  Surgeon: Jeannie Fend  Jeneen Rinks, MD;  Location: [facility] ORS;  Service: Gynecology;  Laterality: Left;   LAPAROSCOPIC OVARIAN CYSTECTOMY Left 02/20/2021    Procedure: LAPAROSCOPIC OVARIAN CYSTECTOMY;  Surgeon: Harlin Heys, MD;  Location: [facility] ORS;  Service: Gynecology;  Laterality: Left;   LAPAROSCOPY N/A 02/20/2021   Procedure: LAPAROSCOPY DIAGNOSTIC;  Surgeon: Harlin Heys, MD;  Location: [facility] ORS;  Service: Gynecology;  Laterality: N/A;   Social History:  reports that she has never smoked. She has never used smokeless tobacco. She reports that she does not currently use alcohol. She reports that she does not use drugs.  No Known Allergies Family History  Problem Relation Age of Onset   Diabetes Father    Hypertension Father    Cancer Maternal Aunt    Gestational diabetes Sister    Family history: Family history reviewed and not pertinent.  Prior to Admission medications   Medication Sig Start Date End Date Taking? Authorizing Provider  amoxicillin-clavulanate (AUGMENTIN) 875-125 MG tablet Take 1 tablet by mouth 2 (two) times daily.    [provider]  Norethindrone-Ethinyl Estradiol-Fe Biphas (LO LOESTRIN FE) 1 MG-10 MCG / 10 MCG tablet Take 1 tablet by mouth daily. Patient not taking: Reported on 10/16/2022 02/05/22 02/05/23  Harlin Heys, MD  penicillin v potassium (VEETID) 500 MG tablet Take 500 mg by mouth in the morning and at bedtime. Patient not taking: Reported on 10/16/2022 10/01/22   [provider]  predniSONE (STERAPRED UNI-PAK 21 TAB) 10 MG (21) TBPK tablet Take 6 tablets at once by mouth on day 1 and then decrease by 1 tablet for each subsequent day Patient not taking: Reported on 10/16/2022 10/04/22   Marquette Old, PA-C   Physical Exam: Vitals:   10/16/22 1421 10/16/22 1801 10/16/22 1802  BP: 121/79  120/70  Pulse: 74  70  Resp: 13  16  Temp: 99 F (37.2 C)  98.8 F (37.1 C)  TempSrc: Oral  Oral  SpO2: 95%  95%  Weight:  86.1 kg   Height:  5' (1.524 m)    Constitutional: appears age-appropriate, NAD, calm, comfortable Eyes: PERRL, lids and conjunctivae normal ENMT: Mucous  membranes are moist. Posterior pharynx clear of any exudate or lesions. Age-appropriate dentition. Hearing appropriate Neck: normal, supple, no masses, no thyromegaly Respiratory: clear to auscultation bilaterally, no wheezing, no crackles. Normal respiratory effort. No accessory muscle use.  Cardiovascular: Regular rate and rhythm, no murmurs / rubs / gallops. No extremity edema. 2+ pedal pulses. No carotid bruits.  Abdomen: Obese abdomen, no tenderness, no masses palpated, no hepatosplenomegaly. Bowel sounds positive.  Musculoskeletal: no clubbing / cyanosis. No joint deformity upper and lower extremities. Good ROM, no contractures, no atrophy. Normal muscle tone.  Skin: no rashes, lesions, ulcers. No induration Neurologic: Sensation intact. Strength 5/5 in all 4.  Psychiatric: Normal judgment and insight. Alert and oriented x 3. Normal mood.   EKG: Not indicated at this time  X-rays on Admission: Not indicated at this time  CT Soft Tissue Neck W Contrast  Result Date: 10/16/2022 CLINICAL DATA:  Epiglottitis or tonsillitis suspected PTA EXAM: CT NECK WITH CONTRAST TECHNIQUE: Multidetector CT imaging of the neck was performed using the standard protocol following the bolus administration of intravenous contrast. RADIATION DOSE REDUCTION: This exam was performed according to the departmental dose-optimization program which includes automated exposure control, adjustment of the mA and/or kV according to patient size and/or use of iterative reconstruction technique. CONTRAST:  19m OMNIPAQUE IOHEXOL 300 MG/ML  SOLN COMPARISON:  None Available. FINDINGS: Pharynx and larynx: Approximally 2.6 x 2.2 x 3.3 cm peripherally enhancing fluid collection in the anterolateral right tonsil (for example see series 2, image 34 and series 6, image 56), compatible with tonsillar abscess. Mildly prominent tonsils, compatible with tonsillitis. Salivary glands: No inflammation, mass, or stone. Thyroid: Normal. Lymph  nodes: Mildly prominent upper cervical chain nodes, nonspecific but likely reactive given the above findings. Vascular: Grossly patent. Limited assessment due to non arterial timing. Limited intracranial: Negative. Visualized orbits: Negative. Mastoids and visualized paranasal sinuses: Mild paranasal sinus mucosal thickening. No mastoid effusions. Skeleton: Reversal of the cervical lordosis, likely positional. Upper chest: Visualized lung apices are clear. IMPRESSION: Findings compatible with tonsillitis and 3.3 cm right palatine tonsillar abscess, described above. Electronically Signed   By: Margaretha Sheffield M.D.   On: 10/16/2022 15:44    Labs on Admission: I have personally reviewed following labs  CBC: Recent Labs  Lab 10/11/22 2054 10/16/22 1501  WBC 4.4 11.6*  NEUTROABS 3.3  --   HGB 15.1* 13.7  HCT 45.8 42.0  MCV 86.9 86.6  PLT 284 073   Basic Metabolic Panel: Recent Labs  Lab 10/11/22 2054 10/16/22 1501  NA 133* 139  K 3.9 3.4*  CL 104 104  CO2 19* 27  GLUCOSE 127* 92  BUN 10 8  CREATININE 0.65 0.62  CALCIUM 8.7* 9.0   GFR: Estimated Creatinine Clearance: 102.9 mL/min (by C-G formula based on SCr of 0.62 mg/dL).  Liver Function Tests: Recent Labs  Lab 10/11/22 2054  AST 25  ALT 25  ALKPHOS 67  BILITOT 0.6  PROT 8.5*  ALBUMIN 4.1   Urine analysis:    Component Value Date/Time   COLORURINE YELLOW (A) 12/09/2020 1551   APPEARANCEUR HAZY (A) 12/09/2020 1551   APPEARANCEUR Hazy 09/09/2014 2015   LABSPEC 1.020 12/09/2020 1551   LABSPEC 1.017 09/09/2014 2015   PHURINE 6.0 12/09/2020 [place] 12/09/2020 1551   GLUCOSEU Negative 09/09/2014 2015   HGBUR SMALL (A) 12/09/2020 1551   BILIRUBINUR NEGATIVE 12/09/2020 1551   BILIRUBINUR neg 01/27/2017 1512   BILIRUBINUR Negative 09/09/2014 2015   KETONESUR NEGATIVE 12/09/2020 1551   PROTEINUR NEGATIVE 12/09/2020 1551   UROBILINOGEN negative 01/27/2017 1512   NITRITE NEGATIVE 12/09/2020 1551    LEUKOCYTESUR TRACE (A) 12/09/2020 1551   LEUKOCYTESUR 1+ 09/09/2014 2015   This document was prepared using Dragon Voice Recognition software and may include unintentional dictation errors.  Dr. Tobie Poet [facility]  If 7PM-7AM, please contact overnight-coverage provider If 7AM-7PM, please contact day coverage provider [email or web address]  10/16/2022, 7:28 PM

## 2022-10-16 NOTE — ED Notes (Signed)
Dr Willeen Cass in with pt  Pt tolerated procedure well  States she is still having some diff swallowing

## 2022-10-16 NOTE — ED Triage Notes (Signed)
First Nurse NOte:  C/O sore throat and right tonsil swelling.  Seen 10/01/2022 for similar.  AAOx3.  Skin warm and dry.  Speech clear.  Managing secretions.  NAD

## 2022-10-16 NOTE — Assessment & Plan Note (Signed)
-   This complicates overall care and prognosis.  

## 2022-10-16 NOTE — Consult Note (Signed)
Pharmacy Antibiotic Note  Mandy Romero is a 26 y.o. female admitted on 10/16/2022 with  peritonsillar abscess .  Pharmacy has been consulted for Unasyn dosing.  Plan:  Unasyn 3 g IV q6h  Height: 5' (152.4 cm) Weight: 86.1 kg (189 lb 13.1 oz) IBW/kg (Calculated) : 45.5  Temp (24hrs), Avg:98.9 F (37.2 C), Min:98.8 F (37.1 C), Max:99 F (37.2 C)  Recent Labs  Lab 10/11/22 2054 10/16/22 1501  WBC 4.4 11.6*  CREATININE 0.65 0.62  LATICACIDVEN 1.6  --     Estimated Creatinine Clearance: 102.9 mL/min (by C-G formula based on SCr of 0.62 mg/dL).    No Known Allergies  Antimicrobials this admission: Unasyn 12/28 >>   Dose adjustments this admission: N/A  Microbiology results: N/A  Thank you for allowing pharmacy to be a part of this patient's care.  Tressie Ellis 10/16/2022 6:07 PM

## 2022-10-16 NOTE — Assessment & Plan Note (Signed)
-   Presumed secondary to right peritonsillar abscess - Repeat CBC in a.m.

## 2022-10-17 DIAGNOSIS — K651 Peritoneal abscess: Secondary | ICD-10-CM | POA: Diagnosis not present

## 2022-10-17 LAB — BASIC METABOLIC PANEL
Anion gap: 11 (ref 5–15)
BUN: 7 mg/dL (ref 6–20)
CO2: 20 mmol/L — ABNORMAL LOW (ref 22–32)
Calcium: 8.7 mg/dL — ABNORMAL LOW (ref 8.9–10.3)
Chloride: 108 mmol/L (ref 98–111)
Creatinine, Ser: 0.37 mg/dL — ABNORMAL LOW (ref 0.44–1.00)
GFR, Estimated: >60 mL/min (ref >60)
Glucose, Bld: 130 mg/dL — ABNORMAL HIGH (ref 70–99)
Potassium: 3.7 mmol/L (ref 3.5–5.1)
Sodium: 139 mmol/L (ref 135–145)

## 2022-10-17 LAB — CBC
HCT: 38.2 % (ref 36.0–46.0)
Hemoglobin: 12.8 g/dL (ref 12.0–15.0)
MCH: 28.7 pg (ref 26.0–34.0)
MCHC: 33.5 g/dL (ref 30.0–36.0)
MCV: 85.7 fL (ref 80.0–100.0)
Platelets: 346 10*3/uL (ref 150–400)
RBC: 4.46 MIL/uL (ref 3.87–5.11)
RDW: 12.5 % (ref 11.5–15.5)
WBC: 10.2 10*3/uL (ref 4.0–10.5)
nRBC: 0 % (ref 0.0–0.2)

## 2022-10-17 LAB — MAGNESIUM: Magnesium: 2 mg/dL (ref 1.7–2.4)

## 2022-10-17 MED ORDER — DEXAMETHASONE 2 MG PO TABS: ORAL_TABLET | ORAL | 0 refills | Status: AC

## 2022-10-17 MED ORDER — TRAMADOL HCL 50 MG PO TABS: 50.0000 mg | ORAL_TABLET | Freq: Four times a day (QID) | ORAL | 0 refills | Status: DC | PRN

## 2022-10-17 MED ORDER — CLINDAMYCIN HCL 300 MG PO CAPS: 300.0000 mg | ORAL_CAPSULE | Freq: Four times a day (QID) | ORAL | 0 refills | Status: AC

## 2022-10-17 NOTE — Discharge Summary (Signed)
Physician Discharge Summary  Laurella Tull [facility] WUJ:[id number] DOB: 1996-03-26 DOA: 10/16/2022  PCP: [facility], [facility]  Admit date: 10/16/2022 Discharge date: 10/17/2022  Admitted From: Home Disposition: Home  Recommendations for Outpatient Follow-up:  Follow up with PCP in 1-2 weeks Follow-up with ENT, Dr. Willeen Cass 1 week Continue antibiotics with clindamycin and Decadron taper on discharge for acute tonsillitis, peritonsillar abscess  Home Health: No Equipment/Devices: None  Discharge Condition: Stable CODE STATUS: Full code Diet recommendation: Soft diet  History of present illness:  Mandy Romero is a 26 year old female with past medical history significant for migraine headaches who presented to [facility] ED on 12/28 with sore throat.  Workup in the ED on imaging notable for tonsillitis with 3.3 cm right palatine tonsillar abscess.  ENT was consulted.  Patient was started on IV antibiotics.  TRH consulted for admission for further evaluation and management.  Hospital course:  Peritonsillar abscess Acute tonsillitis Patient presenting to ED with progressive sore throat.  Patient noted to have an elevated WBC count 11.6.  CT soft tissue neck with positive acute tonsillitis with 3.3 cm right palatine tonsillar abscess.  Patient was started on IV antibiotics with Unasyn and steroids.  ENT was consulted and patient underwent I&D by Dr. Willeen Cass on 12/28.  Patient's symptoms markedly improved and tolerating diet without issue.  Will continue clindamycin 3 mg p.o. 4 times daily x 10 days and Decadron taper.  Outpatient follow-up with ENT 1 week as patient will need tonsillectomy in the near future.  Morbid obesity Body mass index is 37.07 kg/m.  Discussed with patient needs for aggressive lifestyle changes/weight loss as this complicates all facets of care.  Outpatient follow-up with PCP.    Discharge Diagnoses:  Principal Problem:   Peritoneal abscess  (HCC) Active Problems:   Obesity (BMI 30-39.9)   Leukocytosis   Hypokalemia    Discharge Instructions  Discharge Instructions     Call MD for:  difficulty breathing, headache or visual disturbances   Complete by: As directed    Call MD for:  extreme fatigue   Complete by: As directed    Call MD for:  persistant dizziness or light-headedness   Complete by: As directed    Call MD for:  persistant nausea and vomiting   Complete by: As directed    Call MD for:  severe uncontrolled pain   Complete by: As directed    Call MD for:  temperature >100.4   Complete by: As directed    Increase activity slowly   Complete by: As directed       Allergies as of 10/17/2022   No Known Allergies      Medication List     STOP taking these medications    amoxicillin-clavulanate 875-125 MG tablet Commonly known as: AUGMENTIN   Norethindrone-Ethinyl Estradiol-Fe Biphas 1 MG-10 MCG / 10 MCG tablet Commonly known as: LO LOESTRIN FE   penicillin v potassium 500 MG tablet Commonly known as: VEETID   predniSONE 10 MG (21) Tbpk tablet Commonly known as: STERAPRED UNI-PAK 21 TAB       TAKE these medications    clindamycin 300 MG capsule Commonly known as: CLEOCIN Take 1 capsule (300 mg total) by mouth 4 (four) times daily for 10 days.   dexamethasone 2 MG tablet Commonly known as: DECADRON Take 5 tablets (10 mg total) by mouth daily for 2 days, THEN 4 tablets (8 mg total) daily for 2 days, THEN 3 tablets (6 mg total) daily for  2 days, THEN 2 tablets (4 mg total) daily for 2 days, THEN 1 tablet (2 mg total) daily for 2 days. Start taking on: October 17, 2022   traMADol 50 MG tablet Commonly known as: Ultram Take 1 tablet (50 mg total) by mouth every 6 (six) hours as needed for moderate pain.        Follow-up Information     [facility], [facility]. Schedule an appointment as soon as possible for a visit in 1 week(s).   Specialty: General Practice Contact  information: [address] [place] [address] [phone]         Geanie Logan, MD. Schedule an appointment as soon as possible for a visit in 1 week(s).   Specialty: Otolaryngology Contact information: [address] [place] [address] [phone]                No Known Allergies  Consultations: ENT, Dr. Willeen Cass   Procedures/Studies: CT Soft Tissue Neck W Contrast  Result Date: 10/16/2022 CLINICAL DATA:  Epiglottitis or tonsillitis suspected PTA EXAM: CT NECK WITH CONTRAST TECHNIQUE: Multidetector CT imaging of the neck was performed using the standard protocol following the bolus administration of intravenous contrast. RADIATION DOSE REDUCTION: This exam was performed according to the departmental dose-optimization program which includes automated exposure control, adjustment of the mA and/or kV according to patient size and/or use of iterative reconstruction technique. CONTRAST:  OMNIPAQUE IOHEXOL 300 MG/ML  SOLN COMPARISON:  None Available. FINDINGS: Pharynx and larynx: Approximally 2.6 x 2.2 x 3.3 cm peripherally enhancing fluid collection in the anterolateral right tonsil (for example see series 2, image 34 and series 6, image 56), compatible with tonsillar abscess. Mildly prominent tonsils, compatible with tonsillitis. Salivary glands: No inflammation, mass, or stone. Thyroid: Normal. Lymph nodes: Mildly prominent upper cervical chain nodes, nonspecific but likely reactive given the above findings. Vascular: Grossly patent. Limited assessment due to non arterial timing. Limited intracranial: Negative. Visualized orbits: Negative. Mastoids and visualized paranasal sinuses: Mild paranasal sinus mucosal thickening. No mastoid effusions. Skeleton: Reversal of the cervical lordosis, likely positional. Upper chest: Visualized lung apices are clear. IMPRESSION: Findings compatible with tonsillitis and 3.3 cm right palatine  tonsillar abscess, described above. Electronically Signed   By: Feliberto Harts M.D.   On: 10/16/2022 15:44   CT Soft Tissue Neck W Contrast  Result Date: 10/12/2022 CLINICAL DATA:  Initial evaluation for soft tissue infection, recent peritonsillar abscess with persistent fever. EXAM: CT NECK WITH CONTRAST TECHNIQUE: Multidetector CT imaging of the neck was performed using the standard protocol following the bolus administration of intravenous contrast. RADIATION DOSE REDUCTION: This exam was performed according to the departmental dose-optimization program which includes automated exposure control, adjustment of the mA and/or kV according to patient size and/or use of iterative reconstruction technique. CONTRAST:  51mL OMNIPAQUE IOHEXOL 300 MG/ML  SOLN COMPARISON:  Prior CT from 10/04/2022. FINDINGS: Pharynx and larynx: Oral cavity within normal limits. No acute inflammatory changes seen about the dentition. Previously seen changes of acute tonsillitis are improved from prior, although there is persistent tonsillar hypertrophy. Previously seen right-sided tonsillar/peritonsillar abscess is no longer visualized, consistent with interval resolution. Minimal residual stranding within the adjacent right parapharyngeal fat, improved. No retropharyngeal collection. Epiglottis within normal limits. Remainder of the hypopharynx and supraglottic larynx within normal limits. Normal glottis. Subglottic airway patent clear. Salivary glands: Salivary glands including the parotid and submandibular glands are within normal limits. Thyroid: Normal. Lymph nodes: Residual mildly prominent  upper cervical lymph nodes on the right, with largest node at right level 2 measuring 1.5 cm in short axis. Overall appearance is improved from prior. No suppuration or intra nodal abscess formation. Vascular: Normal intravascular enhancement seen throughout the neck. Limited intracranial: Unremarkable. Visualized orbits: Unremarkable.  Mastoids and visualized paranasal sinuses: Scattered mucosal thickening present throughout the sphenoethmoidal and maxillary sinuses. Visualized mastoid air cells and middle ear cavities are clear. Skeleton: Smooth reversal of the normal cervical lordosis, likely positional. No discrete or worrisome osseous lesions. Upper chest: Visualized upper chest demonstrates no acute finding. Other: None. IMPRESSION: 1. Interval improvement in changes of acute tonsillitis, with resolution of previously seen right-sided tonsillar/peritonsillar abscess. 2. Residual mildly prominent upper cervical lymph nodes on the right, improved from prior, and likely reactive. 3. No other new acute abnormality within the neck. Electronically Signed   By: Rise Mu M.D.   On: 10/12/2022 03:33   CT Soft Tissue Neck W Contrast  Result Date: 10/04/2022 CLINICAL DATA:  Right-sided neck pain. Muffled voice. Epiglottitis or tonsillitis suspected. Question peritonsillar abscess. EXAM: CT NECK WITH CONTRAST TECHNIQUE: Multidetector CT imaging of the neck was performed using the standard protocol following the bolus administration of intravenous contrast. RADIATION DOSE REDUCTION: This exam was performed according to the departmental dose-optimization program which includes automated exposure control, adjustment of the mA and/or kV according to patient size and/or use of iterative reconstruction technique. CONTRAST:  OMNIPAQUE IOHEXOL 300 MG/ML  SOLN COMPARISON:  CT neck with contrast 10/20/2015 FINDINGS: Pharynx and larynx: The palatine tonsils are enlarged bilaterally, right greater than left. The oro pharyngeal airway shifted to the left without significant stenosis. Low-density collection with minimal peripheral enhancement along the lateral margin of the right palatine tonsil measures 2.2 x 1.5 x 1.3 cm (AP x CC x TR). Lingual tonsils are within normal limits. Vallecula and epiglottis are within normal limits. Aryepiglottic  folds and piriform sinuses are clear. Vocal cords are midline and symmetric. Trachea is clear. Salivary glands: The submandibular and parotid glands and ducts are within normal limits. Thyroid: Normal Lymph nodes: Enlarged level 2 lymph nodes are present bilaterally, right greater than left. These demonstrate a central fatty hilum and benign morphology. Vascular: No focal vascular lesions are present. Limited intracranial: Within normal limits. Visualized orbits: The globes and orbits are within normal limits. Mastoids and visualized paranasal sinuses: Chronic shrunken right maxillary sinus is present with wall thickening. No active disease is present. The paranasal sinuses and mastoid air cells are otherwise clear. Skeleton: Vertebral body heights and alignment are normal. Reversal of normal cervical lordosis noted. Upper chest: The lung apices are clear. Thoracic inlet is within normal limits. IMPRESSION: 1. Enlarged palatine tonsils bilaterally, right greater than left, consistent with acute tonsillitis. 2. Low-density collection with minimal peripheral enhancement along the lateral margin of the right palatine tonsil measures 2.2 x 1.5 x 1.3 cm. This likely represents a developing peritonsillar abscess. 3. Enlarged level 2 lymph nodes bilaterally, right greater than left, are likely reactive. 4. 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 These results were called by telephone at the time of interpretation on 10/04/2022 at 5:40 pm to provider [facility] , who verbally acknowledged these results. Electronically Signed   By: Marin Roberts M.D.   On: 10/04/2022 17:40     Subjective: Patient seen examined bedside, resting comfortably.  Tolerating diet.  Ready for discharge home.  Discussed compliance with antibiotics and Decadron taper on discharge.  Also will need follow-up with  ENT 1 week.  No other specific questions or concerns at this time.  Denies headache, no dizziness, no chest  pain, no palpitations, no shortness of breath, no fever/chills/night sweats, no nausea/vomiting/diarrhea, no focal weakness, no fatigue, no paresthesias.  No acute events overnight per nursing staff.  Discharge Exam: Vitals:   10/17/22 0328 10/17/22 0659  BP: 109/77 105/72  Pulse: (!) 57 (!) 59  Resp: 16 16  Temp: 97.6 F (36.4 C) 98.1 F (36.7 C)  SpO2: 95% 94%   Vitals:   10/16/22 1802 10/16/22 2221 10/17/22 0328 10/17/22 0659  BP: 120/70 117/76 109/77 105/72  Pulse: 70 72 (!) 57 (!) 59  Resp: Temp: 98.8 F (37.1 C) 98 F (36.7 C) 97.6 F (36.4 C) 98.1 F (36.7 C)  TempSrc: Oral Oral Oral Oral  SpO2: 95% 96% 95% 94%  Weight:      Height:        Physical Exam: GEN: NAD, alert and oriented x 3, wd/wn HEENT: NCAT, PERRL, EOMI, sclera clear, MMM PULM: CTAB w/o wheezes/crackles, normal respiratory effort, on room air CV: RRR w/o M/G/R GI: abd soft, NTND, NABS, no R/G/M MSK: no peripheral edema, muscle strength globally intact 5/5 bilateral upper/lower extremities NEURO: CN II-XII intact, no focal deficits, sensation to light touch intact PSYCH: normal mood/affect Integumentary: dry/intact, no rashes or wounds    The results of significant diagnostics from this hospitalization (including imaging, microbiology, ancillary and laboratory) are listed below for reference.     Microbiology: Recent Results (from the past 240 hour(s))  Blood culture (single)     Status: None   Collection Time: 10/11/22  8:54 PM   Specimen: BLOOD  Result Value Ref Range Status   Specimen Description BLOOD BLOOD RIGHT ARM  Final   Special Requests   Final    BOTTLES DRAWN AEROBIC AND ANAEROBIC Blood Culture adequate volume   Culture   Final    NO GROWTH 5 DAYS Performed at [facility], [address], [place], [place] [address]    Report Status 10/16/2022 FINAL  Final  Resp panel by RT-PCR (RSV, Flu A&B, Covid) Anterior Nasal Swab     Status: Abnormal   Collection  Time: 10/12/22  2:46 AM   Specimen: Anterior Nasal Swab  Result Value Ref Range Status   SARS Coronavirus 2 by RT PCR NEGATIVE NEGATIVE Final    Comment: (NOTE) SARS-CoV-2 target nucleic acids are NOT DETECTED.  The SARS-CoV-2 RNA is generally detectable in upper respiratory specimens during the acute phase of infection. The lowest concentration of SARS-CoV-2 viral copies this assay can detect is 138 copies/mL. A negative result does not preclude SARS-Cov-2 infection and should not be used as the sole basis for treatment or other patient management decisions. A negative result may occur with  improper specimen collection/handling, submission of specimen other than nasopharyngeal swab, presence of viral mutation(s) within the areas targeted by this assay, and inadequate number of viral copies(<138 copies/mL). A negative result must be combined with clinical observations, patient history, and epidemiological information. The expected result is Negative.  Fact Sheet for Patients:  BloggerCourse.com  Fact Sheet for Healthcare Providers:  SeriousBroker.it  This test is no t yet approved or cleared by the [place] FDA and  has been authorized for detection and/or diagnosis of SARS-CoV-2 by FDA under an Emergency Use Authorization (EUA). This EUA will remain  in effect (meaning this test can be used) for the duration of the COVID-19 declaration  under Section 564(b)(1) of the Act, 21 U.S.C.section 360bbb-3(b)(1), unless the authorization is terminated  or revoked sooner.       Influenza A by PCR POSITIVE (A) NEGATIVE Final   Influenza B by PCR NEGATIVE NEGATIVE Final    Comment: (NOTE) The Xpert Xpress SARS-CoV-2/FLU/RSV plus assay is intended as an aid in the diagnosis of influenza from Nasopharyngeal swab specimens and should not be used as a sole basis for treatment. Nasal washings and aspirates are unacceptable for Xpert  Xpress SARS-CoV-2/FLU/RSV testing.  Fact Sheet for Patients: BloggerCourse.com  Fact Sheet for Healthcare Providers: SeriousBroker.it  This test is not yet approved or cleared by the [place] FDA and has been authorized for detection and/or diagnosis of SARS-CoV-2 by FDA under an Emergency Use Authorization (EUA). This EUA will remain in effect (meaning this test can be used) for the duration of the COVID-19 declaration under Section 564(b)(1) of the Act, 21 U.S.C. section 360bbb-3(b)(1), unless the authorization is terminated or revoked.     Resp Syncytial Virus by PCR NEGATIVE NEGATIVE Final    Comment: (NOTE) Fact Sheet for Patients: BloggerCourse.com  Fact Sheet for Healthcare Providers: SeriousBroker.it  This test is not yet approved or cleared by the [place] FDA and has been authorized for detection and/or diagnosis of SARS-CoV-2 by FDA under an Emergency Use Authorization (EUA). This EUA will remain in effect (meaning this test can be used) for the duration of the COVID-19 declaration under Section 564(b)(1) of the Act, 21 U.S.C. section 360bbb-3(b)(1), unless the authorization is terminated or revoked.  Performed at [facility], [address] [place], [place] [address]      Labs: BNP (last 3 results) No results for input(s): "BNP" in the last 8760 hours. Basic Metabolic Panel: Recent Labs  Lab 10/11/22 2054 10/16/22 1501 10/17/22 0319  NA 133* 139 139  K 3.9 3.4* 3.7  CL 104 104 108  CO2 19* 27 20*  GLUCOSE 127* 92 130*  BUN CREATININE 0.65 0.62 0.37*  CALCIUM 8.7* 9.0 8.7*  MG  --   --  2.0   Liver Function Tests: Recent Labs  Lab 10/11/22 2054  AST 25  ALT 25  ALKPHOS 67  BILITOT 0.6  PROT 8.5*  ALBUMIN 4.1   No results for input(s): "LIPASE", "AMYLASE" in the last 168 hours. No results for input(s): "AMMONIA"  in the last 168 hours. CBC: Recent Labs  Lab 10/11/22 2054 10/16/22 1501 10/17/22 0319  WBC 4.4 11.6* 10.2  NEUTROABS 3.3  --   --   HGB 15.1* 13.7 12.8  HCT 45.8 42.0 38.2  MCV 86.9 86.6 85.7  PLT 284 353 346   Cardiac Enzymes: No results for input(s): "CKTOTAL", "CKMB", "CKMBINDEX", "TROPONINI" in the last 168 hours. BNP: Invalid input(s): "POCBNP" CBG: No results for input(s): "GLUCAP" in the last 168 hours. D-Dimer No results for input(s): "DDIMER" in the last 72 hours. Hgb A1c No results for input(s): "HGBA1C" in the last 72 hours. Lipid Profile No results for input(s): "CHOL", "HDL", "LDLCALC", "TRIG", "CHOLHDL", "LDLDIRECT" in the last 72 hours. Thyroid function studies No results for input(s): "TSH", "T4TOTAL", "T3FREE", "THYROIDAB" in the last 72 hours.  Invalid input(s): "FREET3" Anemia work up No results for input(s): "VITAMINB12", "FOLATE", "FERRITIN", "TIBC", "IRON", "RETICCTPCT" in the last 72 hours. Urinalysis    Component Value Date/Time   COLORURINE YELLOW (A) 12/09/2020 1551   APPEARANCEUR HAZY (A) 12/09/2020 1551   APPEARANCEUR Hazy 09/09/2014 2015   LABSPEC  1.020 12/09/2020 1551   LABSPEC 1.017 09/09/2014 2015   PHURINE 6.0 12/09/2020 1551   GLUCOSEU NEGATIVE 12/09/2020 1551   GLUCOSEU Negative 09/09/2014 2015   HGBUR SMALL (A) 12/09/2020 1551   BILIRUBINUR NEGATIVE 12/09/2020 1551   BILIRUBINUR neg 01/27/2017 1512   BILIRUBINUR Negative 09/09/2014 2015   KETONESUR NEGATIVE 12/09/2020 1551   PROTEINUR NEGATIVE 12/09/2020 1551   UROBILINOGEN negative 01/27/2017 1512   NITRITE NEGATIVE 12/09/2020 1551   LEUKOCYTESUR TRACE (A) 12/09/2020 1551   LEUKOCYTESUR 1+ 09/09/2014 2015   Sepsis Labs Recent Labs  Lab 10/11/22 2054 10/16/22 1501 10/17/22 0319  WBC 4.4 11.6* 10.2   Microbiology Recent Results (from the past 240 hour(s))  Blood culture (single)     Status: None   Collection Time: 10/11/22  8:54 PM   Specimen: BLOOD  Result Value  Ref Range Status   Specimen Description BLOOD BLOOD RIGHT ARM  Final   Special Requests   Final    BOTTLES DRAWN AEROBIC AND ANAEROBIC Blood Culture adequate volume   Culture   Final    NO GROWTH 5 DAYS Performed at [facility], [address] [place], [place] [id number]    Report Status 10/16/2022 FINAL  Final  Resp panel by RT-PCR (RSV, Flu A&B, Covid) Anterior Nasal Swab     Status: Abnormal   Collection Time: 10/12/22  2:46 AM   Specimen: Anterior Nasal Swab  Result Value Ref Range Status   SARS Coronavirus 2 by RT PCR NEGATIVE NEGATIVE Final    Comment: (NOTE) SARS-CoV-2 target nucleic acids are NOT DETECTED.  The SARS-CoV-2 RNA is generally detectable in upper respiratory specimens during the acute phase of infection. The lowest concentration of SARS-CoV-2 viral copies this assay can detect is 138 copies/mL. A negative result does not preclude SARS-Cov-2 infection and should not be used as the sole basis for treatment or other patient management decisions. A negative result may occur with  improper specimen collection/handling, submission of specimen other than nasopharyngeal swab, presence of viral mutation(s) within the areas targeted by this assay, and inadequate number of viral copies(<138 copies/mL). A negative result must be combined with clinical observations, patient history, and epidemiological information. The expected result is Negative.  Fact Sheet for Patients:  BloggerCourse.comhttps://www.fda.gov/media/152166/download  Fact Sheet for Healthcare Providers:  SeriousBroker.ithttps://www.fda.gov/media/152162/download  This test is no t yet approved or cleared by the [place] FDA and  has been authorized for detection and/or diagnosis of SARS-CoV-2 by FDA under an Emergency Use Authorization (EUA). This EUA will remain  in effect (meaning this test can be used) for the duration of the COVID-19 declaration under Section 564(b)(1) of the Act, 21 U.S.C.section 360bbb-3(b)(1),  unless the authorization is terminated  or revoked sooner.       Influenza A by PCR POSITIVE (A) NEGATIVE Final   Influenza B by PCR NEGATIVE NEGATIVE Final    Comment: (NOTE) The Xpert Xpress SARS-CoV-2/FLU/RSV plus assay is intended as an aid in the diagnosis of influenza from Nasopharyngeal swab specimens and should not be used as a sole basis for treatment. Nasal washings and aspirates are unacceptable for Xpert Xpress SARS-CoV-2/FLU/RSV testing.  Fact Sheet for Patients: BloggerCourse.comhttps://www.fda.gov/media/152166/download  Fact Sheet for Healthcare Providers: SeriousBroker.ithttps://www.fda.gov/media/152162/download  This test is not yet approved or cleared by the [place] FDA and has been authorized for detection and/or diagnosis of SARS-CoV-2 by FDA under an Emergency Use Authorization (EUA). This EUA will remain in effect (meaning this test can be used) for the duration of the  COVID-19 declaration under Section 564(b)(1) of the Act, 21 U.S.C. section 360bbb-3(b)(1), unless the authorization is terminated or revoked.     Resp Syncytial Virus by PCR NEGATIVE NEGATIVE Final    Comment: (NOTE) Fact Sheet for Patients: BloggerCourse.com  Fact Sheet for Healthcare Providers: SeriousBroker.it  This test is not yet approved or cleared by the [place] FDA and has been authorized for detection and/or diagnosis of SARS-CoV-2 by FDA under an Emergency Use Authorization (EUA). This EUA will remain in effect (meaning this test can be used) for the duration of the COVID-19 declaration under Section 564(b)(1) of the Act, 21 U.S.C. section 360bbb-3(b)(1), unless the authorization is terminated or revoked.  Performed at [facility], [address], [place], [place] [address]      Time coordinating discharge: Over 30 minutes  SIGNED:   Alvira Philips [place], DO  [facility] 10/17/2022, 9:28 AM

## 2022-11-07 ENCOUNTER — Other Ambulatory Visit: Payer: Self-pay

## 2022-11-07 DIAGNOSIS — J029 Acute pharyngitis, unspecified: Secondary | ICD-10-CM | POA: Diagnosis present

## 2022-11-07 DIAGNOSIS — J028 Acute pharyngitis due to other specified organisms: Secondary | ICD-10-CM | POA: Insufficient documentation

## 2022-11-07 DIAGNOSIS — B9789 Other viral agents as the cause of diseases classified elsewhere: Secondary | ICD-10-CM | POA: Insufficient documentation

## 2022-11-07 NOTE — ED Triage Notes (Signed)
Pt presents to ER c/o abscess around her tonsils.  Pt states she was seen here and had abscess drained then.  Pt states she started having pain again in the last few days.  Pt states she is having pain with swallowing, and has been drooling as well.  Pt denies fever/chills at home.  Pt is otherwise A&O x4 at this time in NAD in triage.

## 2022-11-08 ENCOUNTER — Emergency Department: Payer: Medicaid Other

## 2022-11-08 ENCOUNTER — Emergency Department
Admission: EM | Admit: 2022-11-08 | Discharge: 2022-11-08 | Disposition: A | Discharge status: Home / Self Care | Payer: Medicaid Other | Attending: Emergency Medicine | Admitting: Emergency Medicine

## 2022-11-08 DIAGNOSIS — J029 Acute pharyngitis, unspecified: Secondary | ICD-10-CM

## 2022-11-08 LAB — CBC WITH DIFFERENTIAL/PLATELET
Abs Immature Granulocytes: 0.04 10*3/uL (ref 0.00–0.07)
Basophils Absolute: 0 10*3/uL (ref 0.0–0.1)
Basophils Relative: 0 %
Eosinophils Absolute: 0 10*3/uL (ref 0.0–0.5)
Eosinophils Relative: 1 %
HCT: 40.6 % (ref 36.0–46.0)
Hemoglobin: 13.7 g/dL (ref 12.0–15.0)
Immature Granulocytes: 1 %
Lymphocytes Relative: 29 %
Lymphs Abs: 1.7 10*3/uL (ref 0.7–4.0)
MCH: 29.2 pg (ref 26.0–34.0)
MCHC: 33.7 g/dL (ref 30.0–36.0)
MCV: 86.6 fL (ref 80.0–100.0)
Monocytes Absolute: 0.7 10*3/uL (ref 0.1–1.0)
Monocytes Relative: 11 %
Neutro Abs: 3.3 10*3/uL (ref 1.7–7.7)
Neutrophils Relative %: 58 %
Platelets: 266 10*3/uL (ref 150–400)
RBC: 4.69 MIL/uL (ref 3.87–5.11)
RDW: 13.5 % (ref 11.5–15.5)
WBC: 5.7 10*3/uL (ref 4.0–10.5)
nRBC: 0 % (ref 0.0–0.2)

## 2022-11-08 LAB — BASIC METABOLIC PANEL
Anion gap: 7 (ref 5–15)
BUN: 15 mg/dL (ref 6–20)
CO2: 24 mmol/L (ref 22–32)
Calcium: 8.8 mg/dL — ABNORMAL LOW (ref 8.9–10.3)
Chloride: 106 mmol/L (ref 98–111)
Creatinine, Ser: 0.63 mg/dL (ref 0.44–1.00)
GFR, Estimated: >60 mL/min (ref >60)
Glucose, Bld: 146 mg/dL — ABNORMAL HIGH (ref 70–99)
Potassium: 3.2 mmol/L — ABNORMAL LOW (ref 3.5–5.1)
Sodium: 137 mmol/L (ref 135–145)

## 2022-11-08 LAB — POC URINE PREG, ED: Preg Test, Ur: NEGATIVE

## 2022-11-08 LAB — GROUP A STREP BY PCR: Group A Strep by PCR: NOT DETECTED

## 2022-11-08 MED ORDER — DEXAMETHASONE SODIUM PHOSPHATE 10 MG/ML IJ SOLN
10.0000 mg | Freq: Once | INTRAMUSCULAR | Status: AC
  Administered 2022-11-08: 10 mg via INTRAMUSCULAR
  Filled 2022-11-08: qty 1

## 2022-11-08 MED ORDER — IOHEXOL 300 MG/ML  SOLN
75.0000 mL | Freq: Once | INTRAMUSCULAR | Status: AC | PRN
  Administered 2022-11-08: 75 mL via INTRAVENOUS

## 2022-11-08 NOTE — Discharge Instructions (Addendum)
Please take Tylenol and ibuprofen/Advil for your pain.  It is safe to take them together, or to alternate them every few hours.  Take up to 1000mg of Tylenol at a time, up to 4 times per day.  Do not take more than 4000 mg of Tylenol in 24 hours.  For ibuprofen, take 400-600 mg, 3 - 4 times per day.  

## 2022-11-08 NOTE — ED Provider Notes (Signed)
[facility] Provider Note    Event Date/Time   First MD Initiated Contact with Patient 11/08/22 [phone]     (approximate)   History   Sore Throat   HPI  Mandy Romero is a 27 y.o. female who presents to the ED for evaluation of Sore Throat   Patient presents to the ED for evaluation of a sore throat for the past week and concerns for recurrence of a PTA.  I review a brief hospital admission about 1 month ago where she had a right-sided PTA that required ENT I&D, antibiotics and steroids, subsequent outpatient management.  Reports about a week of sore throats and odynophagia.  Subjective fevers and chills.  Reports her son is sick as well with a URI, and he is also checked in as a patient that I see separately.  Reports that she felt she was drooling when she was laying down for bed.  But not when upright or during the daytime   Physical Exam   Triage Vital Signs: ED Triage Vitals  Enc Vitals Group     BP 11/07/22 2340 106/76     Pulse Rate 11/07/22 2340 73     Resp 11/07/22 2340 20     Temp 11/07/22 2340 97.8 F (36.6 C)     Temp Source 11/07/22 2340 Oral     SpO2 11/07/22 2340 99 %     Weight 11/07/22 2341 180 lb (81.6 kg)     Height 11/07/22 2341 5' (1.524 m)     Head Circumference --      Peak Flow --      Pain Score 11/07/22 2341 8     Pain Loc --      Pain Edu? --      Excl. in [place]? --     Most recent vital signs: Vitals:   11/07/22 2340  BP: 106/76  Pulse: 73  Resp: 20  Temp: 97.8 F (36.6 C)  SpO2: 99%    General: Awake, no distress.  Fluent and conversational.  Looks well.  Bilateral tonsils are 2+, erythematous.  Uvula is midline.  No signs of stridor or upper airway obstruction. CV:  Good peripheral perfusion.  Resp:  Normal effort.  Abd:  No distention.  MSK:  No deformity noted.  Neuro:  No focal deficits appreciated. Other:     ED Results / Procedures / Treatments   Labs (all labs ordered are listed, but  only abnormal results are displayed) Labs Reviewed  BASIC METABOLIC PANEL - Abnormal; Notable for the following components:      Result Value   Potassium 3.2 (*)    Glucose, Bld 146 (*)    Calcium 8.8 (*)    All other components within normal limits  GROUP A STREP BY PCR  CBC WITH DIFFERENTIAL/PLATELET  POC URINE PREG, ED    EKG   RADIOLOGY CT soft tissue neck interpreted by me without clear signs of PTA  Official radiology report(s): CT Soft Tissue Neck W Contrast  Result Date: 11/08/2022 CLINICAL DATA:  Sore throat EXAM: CT NECK WITH CONTRAST TECHNIQUE: Multidetector CT imaging of the neck was performed using the standard protocol following the bolus administration of intravenous contrast. RADIATION DOSE REDUCTION: This exam was performed according to the departmental dose-optimization program which includes automated exposure control, adjustment of the mA and/or kV according to patient size and/or use of iterative reconstruction technique. CONTRAST:  34mL OMNIPAQUE IOHEXOL 300 MG/ML  SOLN COMPARISON:  10/16/2022 FINDINGS:  Pharynx and larynx: The palatine and adenoid tonsils are enlarged. There is no peritonsillar abscess. No retropharyngeal abnormality. The epiglottis and larynx are normal. Salivary glands: No inflammation, mass, or stone. Thyroid: Normal. Lymph nodes: None enlarged or abnormal density. Vascular: Negative. Limited intracranial: Negative. Visualized orbits: Negative. Mastoids and visualized paranasal sinuses: Clear. Skeleton: No acute or aggressive process. Upper chest: Negative. Other: None. IMPRESSION: Enlarged palatine and adenoid tonsils, compatible with acute tonsillopharyngitis. No residual or recurrent peritonsillar abscess. Electronically Signed   By: Ulyses Jarred M.D.   On: 11/08/2022 02:55    PROCEDURES and INTERVENTIONS:  Procedures  Medications  iohexol (OMNIPAQUE) 300 MG/ML solution 75 mL (75 mLs Intravenous Contrast Given 11/08/22 0237)  dexamethasone  (DECADRON) injection 10 mg (10 mg Intramuscular Given 11/08/22 0303)     IMPRESSION / MDM / ASSESSMENT AND PLAN / ED COURSE  I reviewed the triage vital signs and the nursing notes.  Differential diagnosis includes, but is not limited to, viral syndrome, strep throat, PTA, angioedema  {Patient presents with symptoms of an acute illness or injury that is potentially life-threatening.  27 year old female presents to the ED with evidence of a nonspecific, likely viral, pharyngitis suitable for outpatient management.  No signs of PTA on her CT.  Blood work is reassuring with a normal CBC, negative strep test.  Metabolic panel with mild hypokalemia.  Will provide Decadron for symptoms and discussed return precautions.  Suitable for outpatient management.  Clinical Course as of 11/08/22 0306  Sat Nov 08, 2022  0300 Reassessed and discussed CT findings. Discussed steroids, outpatient management and return precautions [DS]    Clinical Course User Index [DS] Vladimir Crofts, MD     FINAL CLINICAL IMPRESSION(S) / ED DIAGNOSES   Final diagnoses:  Acute pharyngitis, unspecified etiology  Viral pharyngitis     Rx / DC Orders   ED Discharge Orders     None        Note:  This document was prepared using Dragon voice recognition software and may include unintentional dictation errors.   Vladimir Crofts, MD 11/08/22 [phone]

## 2022-11-08 NOTE — ED Notes (Signed)
ED Provider at bedside. 

## 2023-01-19 ENCOUNTER — Encounter: Payer: Self-pay | Admitting: Otolaryngology

## 2023-01-21 NOTE — Anesthesia Preprocedure Evaluation (Addendum)
Anesthesia Evaluation  Patient identified by MRN, date of birth, ID band Patient awake    Reviewed: Allergy & Precautions, H&P , NPO status , Patient's Chart, lab work & pertinent test results  Airway Mallampati: III  TM Distance: >3 FB Neck ROM: Full    Dental no notable dental hx.    Pulmonary neg pulmonary ROS   Pulmonary exam normal breath sounds clear to auscultation       Cardiovascular negative cardio ROS Normal cardiovascular exam Rhythm:Regular Rate:Normal     Neuro/Psych  Headaches negative neurological ROS  negative psych ROS   GI/Hepatic negative GI ROS, Neg liver ROS,,,  Endo/Other    Morbid obesity  Renal/GU negative Renal ROS  negative genitourinary   Musculoskeletal negative musculoskeletal ROS (+)    Abdominal  (+) + obese  Peds negative pediatric ROS (+)  Hematology negative hematology ROS (+)   Anesthesia Other Findings Migraines Hx peritonsillar abscess Hx ovarian cyst Laparascopic oopherectomy Endometriosis per laparascopy  Reproductive/Obstetrics negative OB ROS                             Anesthesia Physical Anesthesia Plan  ASA: 2  Anesthesia Plan: General ETT   Post-op Pain Management:    Induction: Intravenous  PONV Risk Score and Plan:   Airway Management Planned: Oral ETT  Additional Equipment:   Intra-op Plan:   Post-operative Plan: Extubation in OR  Informed Consent: I have reviewed the patients History and Physical, chart, labs and discussed the procedure including the risks, benefits and alternatives for the proposed anesthesia with the patient or authorized representative who has indicated his/her understanding and acceptance.     Dental Advisory Given  Plan Discussed with: Anesthesiologist, CRNA and Surgeon  Anesthesia Plan Comments: (Patient consented for risks of anesthesia including but not limited to:  - adverse reactions to  medications - damage to eyes, teeth, lips or other oral mucosa - nerve damage due to positioning  - sore throat or hoarseness - Damage to heart, brain, nerves, lungs, other parts of body or loss of life  Patient voiced understanding.)       Anesthesia Quick Evaluation

## 2023-01-26 NOTE — Discharge Instructions (Signed)
T & A INSTRUCTION SHEET - [facility]  P. SCOTT BENNETT, MD  INFORMATION SHEET FOR A TONSILLECTOMY AND ADENDOIDECTOMY  About Your Tonsils and Adenoids The tonsils and adenoids are normal body tissues that are part of our immune system. They normally help to protect us against diseases that may enter our mouth and nose. However, sometimes the tonsils and/or adenoids become too large and obstruct our breathing, especially at night.  If either of these things happen it helps to remove the tonsils and adenoids in order to become healthier. The operation to remove the tonsils and adenoids is called a tonsillectomy and adenoidectomy.  The Location of Your Tonsils and Adenoids The tonsils are located in the back of the throat on both side and sit in a cradle of muscles. The adenoids are located in the roof of the mouth, behind the nose, and closely associated with the opening of the Eustachian tube to the ear.  Surgery on Tonsils and Adenoids A tonsillectomy and adenoidectomy is a short operation which takes about thirty minutes. This includes being put to sleep and being awakened. Tonsillectomies and adenoidectomies are performed at [facility] and may require observation period in the recovery room prior to going home. Children are required to remain in the recovery area for 45 minutes after surgery.  Following the Operation for a Tonsillectomy A cautery machine is used to control bleeding.  Bleeding from a tonsillectomy and adenoidectomy is minimal and postoperatively the risk of bleeding is approximately four percent, although this rarely life threatening.  After your tonsillectomy and adenoidectomy post-op care at home: 1. Our patients are able to go home the same day. You may be given prescriptions for pain medications and antibiotics, if indicated. 2. It is extremely important to remember that fluid intake is of utmost importance after a  tonsillectomy. The amount that you drink must be maintained in the postoperative period. A good indication of whether a child is getting enough fluid is whether his/her urine output is constant.  As long as children are urinating or wetting their diaper every 6 - 8 hours this is usually enough fluid intake.   3. Although rare, this is a risk of some bleeding in the first ten days after surgery. This usually occurs between day five and nine postoperatively. This risk of bleeding is approximately four percent.  If you or your child should have any bleeding you should remain calm and notify our office or go directly to the Emergency Room at [facility] where they will contact us. Our doctors are available seven days a week for notification. We recommend sitting up quietly in a chair, place an ice pack on the front of the neck and spitting out the blood gently until we are able to contact you. Adults should gargle gently with ice water and this may help stop the bleeding. If the bleeding does not stop after a short time, i.e. 10 to 15 minutes, or seems to be increasing again, please contact us or go to the hospital.   4. It is common for the pain to be worse at 5 - 7 days postoperatively. This occurs because the "scab" is peeling off and the mucous membrane (skin of the throat) is growing back where the tonsils were.   5. It is common for a low-grade fever, less than 102, during the first week after a tonsillectomy and adenoidectomy. It is usually due to not drinking enough   liquids, and we suggest your use liquid Tylenol (acetaminophen) or the pain medicine with Tylenol (acetaminophen) prescribed in order to keep your temperature below 102. Please follow the directions on the back of the bottle. 6. Do not take aspirin or any products that contain aspirin such as Bufferin, Anacin, Ecotrin, aspirin gum, Goodies, BC headache powders, etc., after a T&A because it can promote bleeding.  DO NOT TAKE  MOTRIN OR IBUPROFEN. Please check with our office before administering any other medication that may been prescribed by other doctors during the two-week post-operative period. 7. If you happen to look in the mirror or into your child's mouth you will see white/gray patches on the back of the throat.  This is what a scab looks like in the mouth and is normal after having a tonsillectomy and adenoidectomy. It will disappear once the tonsil area heals completely. However, it may cause a noticeable odor, and this too will disappear with time.     8. You or your child may experience ear pain after having a tonsillectomy and adenoidectomy. This is called referred pain and comes from the throat, but it is felt in the ears. Ear pain is quite common and expected. It will usually go away after ten days. There is usually nothing wrong with the ears, and it is primarily due to the healing area stimulating the nerve to the ear that runs along the side of the throat. Use either the prescribed pain medicine or Tylenol (acetaminophen) as needed.  9. The throat tissues after a tonsillectomy are obviously sensitive. Smoking around children who have had a tonsillectomy significantly increases the risk of bleeding.  DO NOT SMOKE! What to Expect Each Day  First Day at Home 1. Patients will be discharged home the same day.  2. Drink at least four glasses of liquid a day. Clear, cool liquids are recommended. Fruit juices containing citric acid are not recommended because they tend to cause pain. Carbonated beverages are allowed if you pour them from glass to glass to remove the bubbles as these tend to cause discomfort. Avoid alcoholic beverages.  3. Eat very soft foods such as soups, broth, jello, custard, pudding, ice cream, popsicles, applesauce, mashed potatoes, and in general anything that you can crush between your tongue and the roof of your mouth. Try adding Carnation Instant Breakfast Mix into your food for extra  calories. It is not uncommon to lose 5 to 10 pounds of fluid weight. The weight will be gained back quickly once you're feeling better and drinking more.  4. Sleep with your head elevated on two pillows for about three days to help decrease the swelling.  5. DO NOT SMOKE!  Day Two  1. Rest as much as possible. Use common sense in your activities.  2. Continue drinking at least four glasses of liquid per day.  3. Follow the soft diet.  4. Use your pain medication as needed.  Day Three  1. Advance your activity as you are able and continue to follow the previous day's suggestions.  Days Four Through Six  1. Advance your diet and begin to eat more solid foods such as chopped hamburger. 2. Advance your activities slowly. Children should be kept mostly around the house.  3. Not uncommonly, there will be more pain at this time. It is temporary, usually lasting a day or two.  Day Seven Through Ten  1. Most individuals by this time are able to return to work or school unless otherwise instructed.   Consider sending children back to school for a half day on the first day back. 

## 2023-01-27 ENCOUNTER — Encounter
Admission: RE | Disposition: A | Discharge status: Home / Self Care | Payer: Self-pay | Source: Home / Self Care | Attending: Otolaryngology

## 2023-01-27 ENCOUNTER — Ambulatory Visit: Payer: Medicaid Other | Admitting: Anesthesiology

## 2023-01-27 ENCOUNTER — Other Ambulatory Visit: Payer: Self-pay

## 2023-01-27 ENCOUNTER — Encounter: Payer: Self-pay | Admitting: Otolaryngology

## 2023-01-27 ENCOUNTER — Ambulatory Visit
Admission: RE | Admit: 2023-01-27 | Discharge: 2023-01-27 | Disposition: A | Discharge status: Home / Self Care | Payer: Medicaid Other | Attending: Otolaryngology | Admitting: Otolaryngology

## 2023-01-27 DIAGNOSIS — Z6836 Body mass index (BMI) 36.0-36.9, adult: Secondary | ICD-10-CM | POA: Diagnosis not present

## 2023-01-27 DIAGNOSIS — N83209 Unspecified ovarian cyst, unspecified side: Secondary | ICD-10-CM

## 2023-01-27 DIAGNOSIS — J3501 Chronic tonsillitis: Secondary | ICD-10-CM | POA: Insufficient documentation

## 2023-01-27 DIAGNOSIS — G43909 Migraine, unspecified, not intractable, without status migrainosus: Secondary | ICD-10-CM

## 2023-01-27 HISTORY — PX: TONSILLECTOMY: SHX5217

## 2023-01-27 LAB — POCT PREGNANCY, URINE: Preg Test, Ur: NEGATIVE

## 2023-01-27 SURGERY — TONSILLECTOMY
Anesthesia: General | Laterality: Bilateral

## 2023-01-27 MED ORDER — 0.9 % SODIUM CHLORIDE (POUR BTL) OPTIME
TOPICAL | Status: DC | PRN
  Administered 2023-01-27: 500 mL

## 2023-01-27 MED ORDER — SUGAMMADEX SODIUM 200 MG/2ML IV SOLN
INTRAVENOUS | Status: DC | PRN
  Administered 2023-01-27: 160 mg via INTRAVENOUS

## 2023-01-27 MED ORDER — DEXMEDETOMIDINE HCL IN NACL 80 MCG/20ML IV SOLN
INTRAVENOUS | Status: DC | PRN
  Administered 2023-01-27 (×2): 8 ug via BUCCAL

## 2023-01-27 MED ORDER — ACETAMINOPHEN 10 MG/ML IV SOLN
1000.0000 mg | Freq: Once | INTRAVENOUS | Status: AC
  Administered 2023-01-27: 1000 mg via INTRAVENOUS

## 2023-01-27 MED ORDER — MIDAZOLAM HCL 5 MG/5ML IJ SOLN
INTRAMUSCULAR | Status: DC | PRN
  Administered 2023-01-27: 2 mg via INTRAVENOUS

## 2023-01-27 MED ORDER — LACTATED RINGERS IV SOLN: INTRAVENOUS | Status: DC

## 2023-01-27 MED ORDER — BUPIVACAINE HCL 0.25 % IJ SOLN
INTRAMUSCULAR | Status: DC | PRN
  Administered 2023-01-27: 1.5 mL

## 2023-01-27 MED ORDER — ROCURONIUM BROMIDE 100 MG/10ML IV SOLN
INTRAVENOUS | Status: DC | PRN
  Administered 2023-01-27: 30 mg via INTRAVENOUS

## 2023-01-27 MED ORDER — SUCCINYLCHOLINE CHLORIDE 200 MG/10ML IV SOSY
PREFILLED_SYRINGE | INTRAVENOUS | Status: DC | PRN
  Administered 2023-01-27: 100 mg via INTRAVENOUS

## 2023-01-27 MED ORDER — HYDROCODONE-ACETAMINOPHEN 7.5-325 MG/15ML PO SOLN: ORAL | 0 refills | Status: DC

## 2023-01-27 MED ORDER — OXYCODONE HCL 5 MG/5ML PO SOLN
5.0000 mg | Freq: Once | ORAL | Status: AC
  Administered 2023-01-27: 5 mg via ORAL

## 2023-01-27 MED ORDER — ONDANSETRON HCL 4 MG/2ML IJ SOLN
INTRAMUSCULAR | Status: DC | PRN
  Administered 2023-01-27: 4 mg via INTRAVENOUS

## 2023-01-27 MED ORDER — LIDOCAINE HCL (CARDIAC) PF 100 MG/5ML IV SOSY
PREFILLED_SYRINGE | INTRAVENOUS | Status: DC | PRN
  Administered 2023-01-27: 40 mg via INTRAVENOUS

## 2023-01-27 MED ORDER — FENTANYL CITRATE (PF) 100 MCG/2ML IJ SOLN
INTRAMUSCULAR | Status: DC | PRN
  Administered 2023-01-27 (×2): 50 ug via INTRAVENOUS

## 2023-01-27 MED ORDER — PROPOFOL 10 MG/ML IV BOLUS
INTRAVENOUS | Status: DC | PRN
  Administered 2023-01-27: 200 mg via INTRAVENOUS

## 2023-01-27 MED ORDER — PREDNISOLONE SODIUM PHOSPHATE 15 MG/5ML PO SOLN: ORAL | 0 refills | Status: DC

## 2023-01-27 MED ORDER — OXYMETAZOLINE HCL 0.05 % NA SOLN
NASAL | Status: DC | PRN
  Administered 2023-01-27: 1 via TOPICAL

## 2023-01-27 MED ORDER — DEXAMETHASONE SODIUM PHOSPHATE 4 MG/ML IJ SOLN
INTRAMUSCULAR | Status: DC | PRN
  Administered 2023-01-27: 12 mg via INTRAVENOUS

## 2023-01-27 SURGICAL SUPPLY — 18 items
ANTIFOG SOL W/FOAM PAD STRL (MISCELLANEOUS) ×1
BLADE ELECT COATED/INSUL 125 (ELECTRODE) ×1 IMPLANT
CANISTER SUCT 1200ML W/VALVE (MISCELLANEOUS) ×1 IMPLANT
CATH ROBINSON RED A/P 10FR (CATHETERS) ×1 IMPLANT
CATH ROBINSON RED A/P 8FR (CATHETERS) IMPLANT
COAG SUCTION FOOTSWITCH 10FR (SUCTIONS) IMPLANT
ELECT REM PT RETURN 9FT ADLT (ELECTROSURGICAL) ×1
ELECTRODE REM PT RTRN 9FT ADLT (ELECTROSURGICAL) ×1 IMPLANT
GLOVE SURG ENC MOIS LTX SZ7.5 (GLOVE) ×1 IMPLANT
KIT TURNOVER KIT A (KITS) ×1 IMPLANT
NS IRRIG 500ML POUR BTL (IV SOLUTION) ×1 IMPLANT
PACK TONSIL AND ADENOID CUSTOM (PACKS) ×1 IMPLANT
PENCIL SMOKE EVACUATOR (MISCELLANEOUS) ×1 IMPLANT
SLEEVE SUCTION 125 (MISCELLANEOUS) ×1 IMPLANT
SOLUTION ANTFG W/FOAM PAD STRL (MISCELLANEOUS) ×1 IMPLANT
SPONGE TONSIL 1 RF SGL (DISPOSABLE) IMPLANT
STRAP BODY AND KNEE 60X3 (MISCELLANEOUS) ×1 IMPLANT
SUCTION COAG ELEC 10 HAND CTRL (ELECTROSURGICAL) IMPLANT

## 2023-01-27 NOTE — H&P (Signed)
Mandy Romero, Mandy Romero [id number] 12-05-1995  Date of Admission: @TODAY @ Admitting Physician: Sandi Mealy  Chief Complaint: chronic tonsillitis  HPI: This 27 y.o. year old female with h/o chronic tonsillitis and prior peritonsillar abscess. No recent fever, colds or severe sore throat.   Medications:  No medications prior to admission.    Allergies: No Known Allergies  PMH:  Past Medical History:  Diagnosis Date   Migraine    Ovarian cyst    Ovarian cyst    Peritonsillar abscess    Preterm labor     Fam Hx:  Family History  Problem Relation Age of Onset   Diabetes Father    Hypertension Father    Cancer Maternal Aunt    Gestational diabetes Sister     Soc Hx:  Social History   Socioeconomic History   Marital status: Married    Spouse name: Not on file   Number of children: Not on file   Years of education: Not on file   Highest education level: Not on file  Occupational History   Not on file  Tobacco Use   Smoking status: Never   Smokeless tobacco: Never  Vaping Use   Vaping Use: Never used  Substance and Sexual Activity   Alcohol use: Not Currently    Comment: rarely   Drug use: Never   Sexual activity: Yes    Partners: Male    Birth control/protection: Pill, Surgical    Comment: oophorectomy  Other Topics Concern   Not on file  Social History Narrative   Not on file   Social Determinants of Health   Financial Resource Strain: Not on file  Food Insecurity: Not on file  Transportation Needs: Not on file  Physical Activity: Not on file  Stress: Not on file  Social Connections: Not on file  Intimate Partner Violence: Not on file    PSH:  Past Surgical History:  Procedure Laterality Date   CHOLECYSTECTOMY  05/2012   LAPAROSCOPIC LYSIS OF ADHESIONS  07/30/2020   Procedure: LAPAROSCOPIC LYSIS OF PELVIC ADHESIONS;  Surgeon: Linzie Collin, MD;  Location: [facility] ORS;  Service: Gynecology;;   LAPAROSCOPIC OVARIAN CYSTECTOMY Left 07/30/2020    Procedure: LAPAROSCOPIC OVARIAN CYSTECTOMY;  Surgeon: Linzie Collin, MD;  Location: [facility] ORS;  Service: Gynecology;  Laterality: Left;   LAPAROSCOPIC OVARIAN CYSTECTOMY Left 02/20/2021   Procedure: LAPAROSCOPIC OVARIAN CYSTECTOMY;  Surgeon: Linzie Collin, MD;  Location: [facility] ORS;  Service: Gynecology;  Laterality: Left;   LAPAROSCOPY N/A 02/20/2021   Procedure: LAPAROSCOPY DIAGNOSTIC;  Surgeon: Linzie Collin, MD;  Location: [facility] ORS;  Service: Gynecology;  Laterality: N/A;  .   PHYSICAL EXAM  Vitals: Blood pressure 94/79, pulse 74, temperature 97.8 F (36.6 C), resp. rate 18, height 5' (1.524 m), weight 83.9 kg, last menstrual period 01/12/2023, SpO2 96 %.. General: Well-developed, Well-nourished in no acute distress Mood: Mood and affect well adjusted, pleasant and cooperative. Orientation: Grossly alert and oriented. Vocal Quality: No hoarseness. Communicates verbally. head and Face: NCAT. No facial asymmetry. No visible skin lesions. No significant facial scars. No tenderness with sinus percussion. Facial strength normal and symmetric. Respiratory: Normal respiratory effort without labored breathing. Lungs CTA bilaterally Cardiovascular: Heart exam shows regular rate and rhythm  ASSESSMENT: Chronic tonsillitis, tonsil hyperplasia  PLAN: Tonsillectomy   Sandi Mealy 01/27/2023 9:04 AM

## 2023-01-27 NOTE — Transfer of Care (Signed)
Immediate Anesthesia Transfer of Care Note  Patient: Mandy Romero  Procedure(s) Performed: TONSILLECTOMY (Bilateral)  Patient Location: PACU  Anesthesia Type: General ETT  Level of Consciousness: awake, drowsy and patient cooperative  Airway and Oxygen Therapy: Patient Spontanous Breathing and Patient connected to supplemental oxygen  Post-op Assessment: Post-op Vital signs reviewed, Patient's Cardiovascular Status Stable, Respiratory Function Stable, Patent Airway and No signs of Nausea or vomiting  Post-op Vital Signs: Reviewed and stable  Complications: No notable events documented.

## 2023-01-27 NOTE — Anesthesia Procedure Notes (Signed)
Procedure Name: Intubation Date/Time: 01/27/2023 9:36 AM  Performed by: Jeannene Patella, CRNAPre-anesthesia Checklist: Patient identified, Timeout performed, Emergency Drugs available, Suction available and Patient being monitored Patient Re-evaluated:Patient Re-evaluated prior to induction Oxygen Delivery Method: Circle system utilized Preoxygenation: Pre-oxygenation with 100% oxygen Induction Type: IV induction Ventilation: Mask ventilation with difficulty Laryngoscope Size: Mac and 3 Grade View: Grade II Tube type: Oral Rae Tube size: 6.5 mm Number of attempts: 1 Airway Equipment and Method: Stylet Placement Confirmation: ETT inserted through vocal cords under direct vision, positive ETCO2 and breath sounds checked- equal and bilateral ETT to lip (cm): at lip bend of rae tube. Tube secured with: Tape Dental Injury: Teeth and Oropharynx as per pre-operative assessment  Future Recommendations: Recommend- induction with short-acting agent, and alternative techniques readily available

## 2023-01-27 NOTE — Op Note (Signed)
01/27/2023  10:33 AM    Mandy Romero  [id number]   Pre-Op Diagnosis:  Tonsillitis, chronic, Hypertrophy of tonsils  Post-op Diagnosis: Tonsillitis, chronic, Hypertrophy of tonsils  Procedure: Tonsillectomy  Surgeon:  Sandi Mealy., MD  Anesthesia:  General endotracheal  EBL:  Less than 25 cc  Complications:  None  Findings: 3+ cryptic tonsils. Right tonsil with extensive scarring within the tonsillar fossa  Procedure: The patient was taken to the Operating Room and placed in the supine position.  After induction of general endotracheal anesthesia, the table was turned 90 degrees and the patient was draped in the usual fashion with the eyes protected.  A mouth gag was inserted into the oral cavity to open the mouth, and examination of the oropharynx showed the uvula was non-bifid. The palate was palpated, and there was no evidence of submucous cleft.  Examination of the nasopharynx showed no obstructing adenoids. The right tonsil was grasped with an Allis clamp and resected from the tonsillar fossa in the usual fashion with the Bovie. This was more complicated due to extensive scarring within the tonsillar fossa from prior abscess. No actual abscess cavity was encountered. There was a more firm area of scar tissue superiorly and this was aspirated with an 18 gage needle to confirm it did not contain a fluid collection. The left tonsil was resected in the same fashion. The Bovie was used to obtain hemostasis. Each tonsillar fossa was then carefully injected with 0.25% marcaine , avoiding intravascular injection. The nose and throat were irrigated and suctioned to remove any blood clot. The mouth gag was removed with no evidence of active bleeding.  The patient was then returned to the anesthesiologist for awakening, and was taken to the Recovery Room in stable condition.  Cultures:  None.  Specimens:  Tonsils.  Disposition:   Discharge home after recovery    Sandi Mealy 01/27/2023 10:33 AM

## 2023-01-27 NOTE — Anesthesia Postprocedure Evaluation (Signed)
Anesthesia Post Note  Patient: Mandy Romero  Procedure(s) Performed: TONSILLECTOMY (Bilateral)  Patient location during evaluation: PACU Anesthesia Type: General Level of consciousness: awake and alert Pain management: pain level controlled Vital Signs Assessment: post-procedure vital signs reviewed and stable Respiratory status: spontaneous breathing, nonlabored ventilation, respiratory function stable and patient connected to nasal cannula oxygen Cardiovascular status: blood pressure returned to baseline and stable Postop Assessment: no apparent nausea or vomiting Anesthetic complications: no   No notable events documented.   Last Vitals:  Vitals:   01/27/23 1100 01/27/23 1115  BP: 103/69 109/71  Pulse: 73 72  Resp: 17 16  Temp:  36.6 C  SpO2: 93% 95%    Last Pain:  Vitals:   01/27/23 1115  PainSc: Asleep                 Bevely Palmer Esmae Donathan

## 2023-01-28 ENCOUNTER — Encounter: Payer: Self-pay | Admitting: Otolaryngology

## 2023-01-29 LAB — SURGICAL PATHOLOGY

## 2023-03-10 ENCOUNTER — Other Ambulatory Visit (HOSPITAL_COMMUNITY)
Admission: RE | Admit: 2023-03-10 | Discharge: 2023-03-10 | Disposition: A | Discharge status: Home / Self Care | Payer: Medicaid Other | Source: Ambulatory Visit | Attending: Obstetrics and Gynecology | Admitting: Obstetrics and Gynecology

## 2023-03-10 ENCOUNTER — Other Ambulatory Visit: Payer: Self-pay | Admitting: Obstetrics and Gynecology

## 2023-03-10 ENCOUNTER — Encounter: Payer: Self-pay | Admitting: Obstetrics and Gynecology

## 2023-03-10 ENCOUNTER — Ambulatory Visit (INDEPENDENT_AMBULATORY_CARE_PROVIDER_SITE_OTHER): Payer: Medicaid Other | Admitting: Obstetrics and Gynecology

## 2023-03-10 VITALS — BP 107/70 | HR 75 | Ht 60.0 in | Wt 193.4 lb

## 2023-03-10 DIAGNOSIS — Z01419 Encounter for gynecological examination (general) (routine) without abnormal findings: Secondary | ICD-10-CM

## 2023-03-10 DIAGNOSIS — Z3041 Encounter for surveillance of contraceptive pills: Secondary | ICD-10-CM

## 2023-03-10 DIAGNOSIS — Z113 Encounter for screening for infections with a predominantly sexual mode of transmission: Secondary | ICD-10-CM

## 2023-03-10 DIAGNOSIS — L83 Acanthosis nigricans: Secondary | ICD-10-CM

## 2023-03-10 MED ORDER — NORETHIN-ETH ESTRAD-FE BIPHAS 1 MG-10 MCG / 10 MCG PO TABS
1.0000 | ORAL_TABLET | Freq: Every day | ORAL | 11 refills | Status: DC
Start: 2023-03-10 — End: 2023-12-18

## 2023-03-10 NOTE — Progress Notes (Signed)
HPI:      [name] is a 27 y.o. [phone] who LMP was No LMP recorded (lmp unknown).  Subjective:   She presents today for her annual examination.  She would like to be tested for all STDs.  She reports no concerns or symptoms but likes to be sure.  She is having very limited menses on OCPs and is happy with this.  (she has a history of endometriosis-surgical diagnosis) she is not having pelvic pain. She complains of a dark-colored irritated skin condition between and under her breasts.  She reports it is also on her neck.    Hx: The following portions of the patient's history were reviewed and updated as appropriate:             She  has a past medical history of Migraine, Ovarian cyst, Ovarian cyst, Peritonsillar abscess, and Preterm labor. She does not have any pertinent problems on file. She  has a past surgical history that includes Laparoscopic ovarian cystectomy (Left, 07/30/2020); Laparoscopic lysis of adhesions (07/30/2020); Cholecystectomy (05/2012); Laparoscopic ovarian cystectomy (Left, 02/20/2021); laparoscopy (N/A, 02/20/2021); and Tonsillectomy (Bilateral, 01/27/2023). Her family history includes Cancer in her maternal aunt; Diabetes in her father; Gestational diabetes in her sister; Hypertension in her father. She  reports that she has never smoked. She has never used smokeless tobacco. She reports that she does not currently use alcohol. She reports that she does not use drugs. She has a current medication list which includes the following prescription(s): norethindrone-ethinyl estradiol-fe biphas. She has No Known Allergies.       Review of Systems:  Review of Systems  Constitutional: Denied constitutional symptoms, night sweats, recent illness, fatigue, fever, insomnia and weight loss.  Eyes: Denied eye symptoms, eye pain, photophobia, vision change and visual disturbance.  Ears/Nose/Throat/Neck: Denied ear, nose, throat or neck symptoms, hearing loss, nasal  discharge, sinus congestion and sore throat.  Cardiovascular: Denied cardiovascular symptoms, arrhythmia, chest pain/pressure, edema, exercise intolerance, orthopnea and palpitations.  Respiratory: Denied pulmonary symptoms, asthma, pleuritic pain, productive sputum, cough, dyspnea and wheezing.  Gastrointestinal: Denied, gastro-esophageal reflux, melena, nausea and vomiting.  Genitourinary: Denied genitourinary symptoms including symptomatic vaginal discharge, pelvic relaxation issues, and urinary complaints.  Musculoskeletal: Denied musculoskeletal symptoms, stiffness, swelling, muscle weakness and myalgia.  Dermatologic: Denied dermatology symptoms, rash and scar.  Neurologic: Denied neurology symptoms, dizziness, headache, neck pain and syncope.  Psychiatric: Denied psychiatric symptoms, anxiety and depression.  Endocrine: Denied endocrine symptoms including hot flashes and night sweats.   Meds:   No current outpatient medications on file prior to visit.   No current facility-administered medications on file prior to visit.     Objective:     Vitals:   03/10/23 0930  BP: 107/70  Pulse: 75    Filed Weights   03/10/23 0930  Weight: 193 lb 6.4 oz (87.7 kg)              Physical examination General NAD, Conversant  HEENT Atraumatic; Op clear with mmm.  Normo-cephalic. Pupils reactive. Anicteric sclerae  Thyroid/Neck Smooth without nodularity or enlargement. Normal ROM.  Neck Supple.  Skin Acanthosis noted on neck and between her breasts  Breasts: No masses or discharge.  Symmetric.  No axillary adenopathy.  Lungs: Clear to auscultation.No rales or wheezes. Normal Respiratory effort, no retractions.  Heart: NSR.  No murmurs or rubs appreciated. No peripheral edema  Abdomen: Soft.  Non-tender.  No masses.  No HSM. No hernia  Extremities: Moves all appropriately.  Normal  ROM for age. No lymphadenopathy.  Neuro: Oriented to PPT.  Normal mood. Normal affect.     Pelvic:  Declined -patient self swab for STDs     Assessment:    [id number] Patient Active Problem List   Diagnosis Date Noted   Peritoneal abscess (HCC) 10/16/2022   Leukocytosis 10/16/2022   Hypokalemia 10/16/2022   Ovarian cyst 07/30/2020   Pelvic adhesions 07/30/2020   Pelvic pain in female 07/30/2020   Vaginal bleeding in pregnancy, third trimester 01/23/2017   Migraine 01/08/2017   Abnormal glucose tolerance in pregnancy 12/25/2016   Pelvic pain in pregnancy, antepartum, third trimester 12/21/2016   Headache in pregnancy 11/27/2016   H/O preterm delivery, currently pregnant, third trimester 09/01/2016   Late prenatal care affecting pregnancy in second trimester 09/01/2016   Obesity (BMI 30-39.9) 08/30/2016   Maternal varicella, non-immune 08/30/2016     1. Well woman exam with routine gynecological exam   2. Screen for STD (sexually transmitted disease)   3. Oral contraceptive pill surveillance   4. Acanthosis nigricans        Plan:            1.  Basic Screening Recommendations The basic screening recommendations for asymptomatic women were discussed with the patient during her visit.  The age-appropriate recommendations were discussed with her and the rational for the tests reviewed.  When I am informed by the patient that another primary care physician has previously obtained the age-appropriate tests and they are up-to-date, only outstanding tests are ordered and referrals given as necessary.  Abnormal results of tests will be discussed with her when all of her results are completed.  Routine preventative health maintenance measures emphasized: Exercise/Diet/Weight control, Tobacco Warnings, Alcohol/Substance use risks and Stress Management Patient desires STD testing today-performed 2.  Continue OCPs 3.  Acanthosis discussed  Orders Orders Placed This Encounter  Procedures   Hepatitis B surface antigen   Hepatitis C antibody   HIV Antibody (routine testing w rflx)    RPR     Meds ordered this encounter  Medications   Norethindrone-Ethinyl Estradiol-Fe Biphas (LO LOESTRIN FE) 1 MG-10 MCG / 10 MCG tablet    Sig: Take 1 tablet by mouth daily.    Dispense:  28 tablet    Refill:  11          F/U  Return in about 1 year (around 03/09/2024) for Annual Physical.  Elonda Husky, M.D. 03/10/2023 9:58 AM

## 2023-03-10 NOTE — Progress Notes (Signed)
Patients presents for annual exam today. She states light to no cycle with current OCP, would like to continue. Up to date on pap smear. She states no other questions or concerns at this time.

## 2023-03-11 LAB — HIV ANTIBODY (ROUTINE TESTING W REFLEX): HIV Screen 4th Generation wRfx: NONREACTIVE

## 2023-03-11 LAB — CERVICOVAGINAL ANCILLARY ONLY
Chlamydia: NEGATIVE
Comment: NEGATIVE
Comment: NEGATIVE
Comment: NORMAL
Neisseria Gonorrhea: NEGATIVE
Trichomonas: NEGATIVE

## 2023-03-11 LAB — RPR: RPR Ser Ql: NONREACTIVE

## 2023-03-11 LAB — HEPATITIS B SURFACE ANTIGEN: Hepatitis B Surface Ag: NEGATIVE

## 2023-03-11 LAB — HEPATITIS C ANTIBODY: Hep C Virus Ab: NONREACTIVE

## 2023-11-25 ENCOUNTER — Emergency Department
Admission: EM | Admit: 2023-11-25 | Discharge: 2023-11-25 | Disposition: A | Discharge status: Home / Self Care | Payer: Medicaid Other | Attending: Emergency Medicine | Admitting: Emergency Medicine

## 2023-11-25 ENCOUNTER — Other Ambulatory Visit: Payer: Self-pay

## 2023-11-25 ENCOUNTER — Encounter: Payer: Self-pay | Admitting: Intensive Care

## 2023-11-25 ENCOUNTER — Emergency Department: Payer: Medicaid Other

## 2023-11-25 DIAGNOSIS — M25561 Pain in right knee: Secondary | ICD-10-CM

## 2023-11-25 DIAGNOSIS — Y92410 Unspecified street and highway as the place of occurrence of the external cause: Secondary | ICD-10-CM | POA: Insufficient documentation

## 2023-11-25 DIAGNOSIS — S8001XA Contusion of right knee, initial encounter: Secondary | ICD-10-CM | POA: Insufficient documentation

## 2023-11-25 DIAGNOSIS — S8000XA Contusion of unspecified knee, initial encounter: Secondary | ICD-10-CM

## 2023-11-25 DIAGNOSIS — S8991XA Unspecified injury of right lower leg, initial encounter: Secondary | ICD-10-CM | POA: Diagnosis present

## 2023-11-25 DIAGNOSIS — S8002XA Contusion of left knee, initial encounter: Secondary | ICD-10-CM | POA: Diagnosis not present

## 2023-11-25 MED ORDER — CYCLOBENZAPRINE HCL 5 MG PO TABS: 5.0000 mg | ORAL_TABLET | Freq: Three times a day (TID) | ORAL | 0 refills | Status: DC | PRN
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
## 2023-11-25 NOTE — Discharge Instructions (Addendum)
 Your exam and x-ray is normal and reassuring at this time.  No signs of any serious underlying injury.  Take the prescription anti-inflammatory as directed.  Follow-up with primary provider or orthopedics for ongoing evaluation.

## 2023-11-25 NOTE — ED Notes (Signed)
..  The patient is A&OX4, ambulatory at d/c with independent steady gait, NAD. Pt verbalized understanding of d/c instructions, prescriptions and follow up care.

## 2023-12-18 ENCOUNTER — Other Ambulatory Visit: Payer: Self-pay

## 2023-12-18 ENCOUNTER — Encounter: Payer: Self-pay | Admitting: Obstetrics and Gynecology

## 2023-12-18 ENCOUNTER — Emergency Department: Payer: Medicaid Other | Admitting: Anesthesiology

## 2023-12-18 ENCOUNTER — Emergency Department: Payer: Medicaid Other

## 2023-12-18 ENCOUNTER — Ambulatory Visit
Admission: EM | Admit: 2023-12-18 | Discharge: 2023-12-18 | Disposition: A | Discharge status: Home / Self Care | Payer: Medicaid Other | Attending: Emergency Medicine | Admitting: Emergency Medicine

## 2023-12-18 ENCOUNTER — Encounter
Admission: EM | Disposition: A | Discharge status: Home / Self Care | Payer: Self-pay | Source: Home / Self Care | Attending: Emergency Medicine

## 2023-12-18 DIAGNOSIS — O039 Complete or unspecified spontaneous abortion without complication: Secondary | ICD-10-CM

## 2023-12-18 DIAGNOSIS — Z3A01 Less than 8 weeks gestation of pregnancy: Secondary | ICD-10-CM

## 2023-12-18 DIAGNOSIS — K661 Hemoperitoneum: Secondary | ICD-10-CM | POA: Insufficient documentation

## 2023-12-18 DIAGNOSIS — O00101 Right tubal pregnancy without intrauterine pregnancy: Secondary | ICD-10-CM | POA: Insufficient documentation

## 2023-12-18 DIAGNOSIS — Z90721 Acquired absence of ovaries, unilateral: Secondary | ICD-10-CM | POA: Insufficient documentation

## 2023-12-18 DIAGNOSIS — O00201 Right ovarian pregnancy without intrauterine pregnancy: Secondary | ICD-10-CM

## 2023-12-18 HISTORY — PX: DIAGNOSTIC LAPAROSCOPY WITH REMOVAL OF ECTOPIC PREGNANCY: SHX6449

## 2023-12-18 LAB — CBC
HCT: 37.8 % (ref 36.0–46.0)
Hemoglobin: 13 g/dL (ref 12.0–15.0)
MCH: 30.1 pg (ref 26.0–34.0)
MCHC: 34.4 g/dL (ref 30.0–36.0)
MCV: 87.5 fL (ref 80.0–100.0)
Platelets: 248 10*3/uL (ref 150–400)
RBC: 4.32 MIL/uL (ref 3.87–5.11)
RDW: 13.1 % (ref 11.5–15.5)
WBC: 6.9 10*3/uL (ref 4.0–10.5)
nRBC: 0 % (ref 0.0–0.2)

## 2023-12-18 LAB — COMPREHENSIVE METABOLIC PANEL
ALT: 29 U/L (ref 0–44)
AST: 22 U/L (ref 15–41)
Albumin: 3.8 g/dL (ref 3.5–5.0)
Alkaline Phosphatase: 54 U/L (ref 38–126)
Anion gap: 6 (ref 5–15)
BUN: 11 mg/dL (ref 6–20)
CO2: 20 mmol/L — ABNORMAL LOW (ref 22–32)
Calcium: 8.5 mg/dL — ABNORMAL LOW (ref 8.9–10.3)
Chloride: 108 mmol/L (ref 98–111)
Creatinine, Ser: 0.45 mg/dL (ref 0.44–1.00)
GFR, Estimated: >60 mL/min (ref >60)
Glucose, Bld: 124 mg/dL — ABNORMAL HIGH (ref 70–99)
Potassium: 3.5 mmol/L (ref 3.5–5.1)
Sodium: 134 mmol/L — ABNORMAL LOW (ref 135–145)
Total Bilirubin: 1 mg/dL (ref 0.0–1.2)
Total Protein: 6.9 g/dL (ref 6.5–8.1)

## 2023-12-18 LAB — PROTIME-INR
INR: 1 (ref 0.8–1.2)
Prothrombin Time: 13.8 s (ref 11.4–15.2)

## 2023-12-18 LAB — URINALYSIS, ROUTINE W REFLEX MICROSCOPIC
Bilirubin Urine: NEGATIVE
Glucose, UA: NEGATIVE mg/dL
Ketones, ur: NEGATIVE mg/dL
Nitrite: NEGATIVE
Protein, ur: NEGATIVE mg/dL
Specific Gravity, Urine: 1.031 — ABNORMAL HIGH (ref 1.005–1.030)
pH: 5 (ref 5.0–8.0)

## 2023-12-18 LAB — POC URINE PREG, ED: Preg Test, Ur: POSITIVE — AB

## 2023-12-18 LAB — LIPASE, BLOOD: Lipase: 26 U/L (ref 11–51)

## 2023-12-18 LAB — HCG, QUANTITATIVE, PREGNANCY: hCG, Beta Chain, Quant, S: 1182 m[IU]/mL — ABNORMAL HIGH (ref <5)

## 2023-12-18 LAB — TYPE AND SCREEN
ABO/RH(D): O POS
Antibody Screen: NEGATIVE

## 2023-12-18 SURGERY — LAPAROSCOPY, WITH ECTOPIC PREGNANCY SURGICAL TREATMENT
Anesthesia: General | Site: Abdomen | Laterality: Right

## 2023-12-18 MED ORDER — BUPIVACAINE HCL (PF) 0.5 % IJ SOLN
INTRAMUSCULAR | Status: AC
  Filled 2023-12-18: qty 30

## 2023-12-18 MED ORDER — DEXAMETHASONE SODIUM PHOSPHATE 10 MG/ML IJ SOLN
INTRAMUSCULAR | Status: DC | PRN
  Administered 2023-12-18: 8 mg via INTRAVENOUS

## 2023-12-18 MED ORDER — METHYLENE BLUE (ANTIDOTE) 1 % IV SOLN
INTRAVENOUS | Status: AC
  Filled 2023-12-18: qty 10

## 2023-12-18 MED ORDER — OXYCODONE HCL 5 MG PO TABS
5.0000 mg | ORAL_TABLET | Freq: Once | ORAL | Status: AC | PRN
  Administered 2023-12-18: 5 mg via ORAL

## 2023-12-18 MED ORDER — GABAPENTIN 300 MG PO CAPS
ORAL_CAPSULE | ORAL | Status: AC
  Filled 2023-12-18: qty 1

## 2023-12-18 MED ORDER — FENTANYL CITRATE (PF) 100 MCG/2ML IJ SOLN
INTRAMUSCULAR | Status: DC | PRN
  Administered 2023-12-18 (×4): 50 ug via INTRAVENOUS

## 2023-12-18 MED ORDER — GABAPENTIN 300 MG PO CAPS
300.0000 mg | ORAL_CAPSULE | ORAL | Status: AC
  Administered 2023-12-18: 300 mg via ORAL

## 2023-12-18 MED ORDER — FENTANYL CITRATE (PF) 100 MCG/2ML IJ SOLN
INTRAMUSCULAR | Status: AC
  Filled 2023-12-18: qty 2

## 2023-12-18 MED ORDER — GLYCOPYRROLATE 0.2 MG/ML IJ SOLN
INTRAMUSCULAR | Status: DC | PRN
  Administered 2023-12-18: .2 mg via INTRAVENOUS

## 2023-12-18 MED ORDER — OXYCODONE HCL 5 MG/5ML PO SOLN: 5.0000 mg | Freq: Once | ORAL | Status: AC | PRN

## 2023-12-18 MED ORDER — LACTATED RINGERS IV SOLN: INTRAVENOUS | Status: DC

## 2023-12-18 MED ORDER — ACETAMINOPHEN 500 MG PO TABS: 1000.0000 mg | ORAL_TABLET | Freq: Four times a day (QID) | ORAL | 1 refills | Status: DC | PRN

## 2023-12-18 MED ORDER — OXYCODONE HCL 5 MG PO TABS: 5.0000 mg | ORAL_TABLET | Freq: Four times a day (QID) | ORAL | 0 refills | Status: DC | PRN

## 2023-12-18 MED ORDER — CEFAZOLIN SODIUM-DEXTROSE 2-4 GM/100ML-% IV SOLN
INTRAVENOUS | Status: AC
  Filled 2023-12-18: qty 100

## 2023-12-18 MED ORDER — MIDAZOLAM HCL 2 MG/2ML IJ SOLN
INTRAMUSCULAR | Status: AC
  Filled 2023-12-18: qty 2

## 2023-12-18 MED ORDER — MORPHINE SULFATE (PF) 4 MG/ML IV SOLN
4.0000 mg | Freq: Once | INTRAVENOUS | Status: AC
  Administered 2023-12-18: 4 mg via INTRAVENOUS
  Filled 2023-12-18: qty 1

## 2023-12-18 MED ORDER — ACETAMINOPHEN 500 MG PO TABS
ORAL_TABLET | ORAL | Status: AC
  Filled 2023-12-18: qty 2

## 2023-12-18 MED ORDER — MICROFIBRILLAR COLL HEMOSTAT EX PADS
MEDICATED_PAD | CUTANEOUS | Status: DC | PRN
  Administered 2023-12-18: 1 via TOPICAL

## 2023-12-18 MED ORDER — LIDOCAINE HCL (CARDIAC) PF 100 MG/5ML IV SOSY
PREFILLED_SYRINGE | INTRAVENOUS | Status: DC | PRN
  Administered 2023-12-18: 80 mg via INTRAVENOUS

## 2023-12-18 MED ORDER — BUPIVACAINE HCL 0.5 % IJ SOLN
INTRAMUSCULAR | Status: DC | PRN
  Administered 2023-12-18: 30 mL

## 2023-12-18 MED ORDER — SEVOFLURANE IN SOLN
RESPIRATORY_TRACT | Status: AC
  Filled 2023-12-18: qty 250

## 2023-12-18 MED ORDER — SODIUM CHLORIDE (PF) 0.9 % IJ SOLN: INTRAVENOUS | Status: DC | PRN

## 2023-12-18 MED ORDER — SODIUM CHLORIDE 0.9 % IV BOLUS
1000.0000 mL | Freq: Once | INTRAVENOUS | Status: AC
  Administered 2023-12-18: 1000 mL via INTRAVENOUS

## 2023-12-18 MED ORDER — PROPOFOL 10 MG/ML IV BOLUS
INTRAVENOUS | Status: DC | PRN
  Administered 2023-12-18: 180 mg via INTRAVENOUS

## 2023-12-18 MED ORDER — ROCURONIUM BROMIDE 100 MG/10ML IV SOLN
INTRAVENOUS | Status: DC | PRN
  Administered 2023-12-18: 50 mg via INTRAVENOUS
  Administered 2023-12-18: 10 mg via INTRAVENOUS
  Administered 2023-12-18: 20 mg via INTRAVENOUS

## 2023-12-18 MED ORDER — CELECOXIB 200 MG PO CAPS
400.0000 mg | ORAL_CAPSULE | ORAL | Status: AC
  Administered 2023-12-18: 400 mg via ORAL

## 2023-12-18 MED ORDER — ACETAMINOPHEN 500 MG PO TABS
1000.0000 mg | ORAL_TABLET | ORAL | Status: AC
  Administered 2023-12-18: 1000 mg via ORAL

## 2023-12-18 MED ORDER — CHLORHEXIDINE GLUCONATE 0.12 % MT SOLN
OROMUCOSAL | Status: AC
  Filled 2023-12-18: qty 15

## 2023-12-18 MED ORDER — LACTATED RINGERS IR SOLN
Status: DC | PRN
  Administered 2023-12-18: 1

## 2023-12-18 MED ORDER — SUGAMMADEX SODIUM 200 MG/2ML IV SOLN
INTRAVENOUS | Status: DC | PRN
  Administered 2023-12-18: 200 mg via INTRAVENOUS

## 2023-12-18 MED ORDER — OXYCODONE HCL 5 MG PO TABS
ORAL_TABLET | ORAL | Status: AC
  Filled 2023-12-18: qty 1

## 2023-12-18 MED ORDER — CELECOXIB 200 MG PO CAPS
ORAL_CAPSULE | ORAL | Status: AC
  Filled 2023-12-18: qty 2

## 2023-12-18 MED ORDER — ONDANSETRON HCL 4 MG/2ML IJ SOLN
INTRAMUSCULAR | Status: DC | PRN
  Administered 2023-12-18: 4 mg via INTRAVENOUS

## 2023-12-18 MED ORDER — FENTANYL CITRATE (PF) 100 MCG/2ML IJ SOLN
25.0000 ug | INTRAMUSCULAR | Status: DC | PRN
  Administered 2023-12-18 (×3): 25 ug via INTRAVENOUS

## 2023-12-18 MED ORDER — MIDAZOLAM HCL 2 MG/2ML IJ SOLN
INTRAMUSCULAR | Status: DC | PRN
  Administered 2023-12-18: 2 mg via INTRAVENOUS

## 2023-12-18 MED ORDER — METHOTREXATE FOR ECTOPIC PREGNANCY
50.0000 mg/m2 | Freq: Once | INTRAMUSCULAR | Status: AC
  Administered 2023-12-18: 97.5 mg via INTRAMUSCULAR
  Filled 2023-12-18: qty 3.9

## 2023-12-18 SURGICAL SUPPLY — 42 items
BLADE SURG SZ11 CARB STEEL (BLADE) ×1 IMPLANT
CATH ROBINSON RED A/P 16FR (CATHETERS) ×1 IMPLANT
CHLORAPREP W/TINT 26 (MISCELLANEOUS) ×1 IMPLANT
CORD MONOPOLAR M/FML 12FT (MISCELLANEOUS) IMPLANT
DERMABOND ADVANCED .7 DNX12 (GAUZE/BANDAGES/DRESSINGS) ×1 IMPLANT
DRESSING SURGICEL FIBRLLR 1X2 (HEMOSTASIS) IMPLANT
DRSG SURGICEL FIBRILLAR 1X2 (HEMOSTASIS) ×1 IMPLANT
DRSG TELFA 3X4 N-ADH STERILE (GAUZE/BANDAGES/DRESSINGS) IMPLANT
ELECT REM PT RETURN 9FT ADLT (ELECTROSURGICAL) ×1 IMPLANT
ELECTRODE REM PT RTRN 9FT ADLT (ELECTROSURGICAL) ×1 IMPLANT
GAUZE 4X4 16PLY ~~LOC~~+RFID DBL (SPONGE) ×2 IMPLANT
GLOVE BIO SURGEON STRL SZ 6.5 (GLOVE) ×2 IMPLANT
GOWN STRL REUS W/ TWL LRG LVL3 (GOWN DISPOSABLE) ×3 IMPLANT
GRASPER SUT TROCAR 14GX15 (MISCELLANEOUS) IMPLANT
IRRIGATION STRYKERFLOW (MISCELLANEOUS) ×1 IMPLANT
IRRIGATOR STRYKERFLOW (MISCELLANEOUS) ×1 IMPLANT
IV LACTATED RINGERS 1000ML (IV SOLUTION) ×1 IMPLANT
KIT PINK PAD W/HEAD ARE REST (MISCELLANEOUS) ×1 IMPLANT
KIT PINK PAD W/HEAD ARM REST (MISCELLANEOUS) ×1 IMPLANT
KIT TURNOVER CYSTO (KITS) ×1 IMPLANT
MANIFOLD NEPTUNE II (INSTRUMENTS) ×1 IMPLANT
MANIPULATOR UTERINE 4.5 ZUMI (MISCELLANEOUS) IMPLANT
NDL SPNL 22GX3.5 QUINCKE BK (NEEDLE) IMPLANT
NEEDLE SPNL 22GX3.5 QUINCKE BK (NEEDLE) ×1 IMPLANT
NS IRRIG 500ML POUR BTL (IV SOLUTION) ×1 IMPLANT
PACK GYN LAPAROSCOPIC (MISCELLANEOUS) ×1 IMPLANT
PAD OB MATERNITY 11 LF (PERSONAL CARE ITEMS) ×1 IMPLANT
PAD PREP OB/GYN DISP 24X41 (PERSONAL CARE ITEMS) ×1 IMPLANT
SCISSORS METZENBAUM CVD 33 (INSTRUMENTS) IMPLANT
SCRUB CHG 4% DYNA-HEX 4OZ (MISCELLANEOUS) ×1 IMPLANT
SET TUBE SMOKE EVAC HIGH FLOW (TUBING) ×1 IMPLANT
SHEARS HARMONIC 36 ACE (MISCELLANEOUS) IMPLANT
SLEEVE Z-THREAD 5X100MM (TROCAR) ×1 IMPLANT
SUT VIC AB 3-0 SH 27X BRD (SUTURE) IMPLANT
SUT VICRYL 0 UR6 27IN ABS (SUTURE) ×1 IMPLANT
SYR 30ML LL (SYRINGE) IMPLANT
SYR CONTROL 10ML LL (SYRINGE) IMPLANT
SYS BAG RETRIEVAL 10MM (BASKET) ×1 IMPLANT
SYSTEM BAG RETRIEVAL 10MM (BASKET) IMPLANT
TROCAR XCEL UNIV SLVE 11M 100M (ENDOMECHANICALS) IMPLANT
TROCAR Z-THREAD FIOS 11X100 BL (TROCAR) IMPLANT
TROCAR Z-THREAD FIOS 5X100MM (TROCAR) ×1 IMPLANT

## 2023-12-18 NOTE — Transfer of Care (Signed)
 Immediate Anesthesia Transfer of Care Note  Patient: Mandy Romero  Procedure(s) Performed: DIAGNOSTIC LAPAROSCOPY WITH REMOVAL OF ECTOPIC PREGNANCY (Right: Abdomen) CHROMOPERTUBATION (Right: Vagina )  Patient Location: PACU  Anesthesia Type:General  Level of Consciousness: drowsy  Airway & Oxygen Therapy: Patient Spontanous Breathing and Patient connected to face mask oxygen  Post-op Assessment: Report given to RN  Post vital signs: stable  Last Vitals:  Vitals Value Taken Time  BP 114/74 12/18/23 1348  Temp    Pulse 77 12/18/23 1352  Resp 27 12/18/23 1352  SpO2 100 % 12/18/23 1352  Vitals shown include unfiled device data.  Last Pain:  Vitals:   12/18/23 0946  TempSrc: Oral  PainSc: 5       Patients Stated Pain Goal: 0 (12/18/23 0946)  Complications: No notable events documented.

## 2023-12-18 NOTE — Anesthesia Procedure Notes (Signed)
 Procedure Name: Intubation Date/Time: 12/18/2023 11:42 AM  Performed by: Jaye Beagle, CRNAPre-anesthesia Checklist: Patient identified, Emergency Drugs available, Suction available and Patient being monitored Patient Re-evaluated:Patient Re-evaluated prior to induction Oxygen Delivery Method: Circle system utilized Preoxygenation: Pre-oxygenation with 100% oxygen Induction Type: IV induction Ventilation: Mask ventilation without difficulty Laryngoscope Size: McGrath and 3 Grade View: Grade I Tube type: Oral Tube size: 7.0 mm Number of attempts: 1 Airway Equipment and Method: Stylet and Oral airway Placement Confirmation: ETT inserted through vocal cords under direct vision, positive ETCO2 and breath sounds checked- equal and bilateral Secured at: 22 cm Tube secured with: Tape Dental Injury: Teeth and Oropharynx as per pre-operative assessment

## 2023-12-18 NOTE — ED Provider Notes (Signed)
 [facility] Provider Note    Event Date/Time   First MD Initiated Contact with Patient 12/18/23 0800     (approximate)   History   Chief Complaint: Abdominal Pain   HPI  Mandy Romero is a 28 y.o. female with a history of ovarian cyst, migraines who comes ED complaining of lower abdominal pain that started last night.  Constant, waxing waning, no aggravating or alleviating factors.  Associated with vaginal spotting.  LMP was November 15, 2023.  No unusual vaginal discharge, no dysuria          Physical Exam   Triage Vital Signs: ED Triage Vitals  Encounter Vitals Group     BP 12/18/23 0606 (!) 128/108     Systolic BP Percentile --      Diastolic BP Percentile --      Pulse Rate 12/18/23 0606 75     Resp 12/18/23 0606 18     Temp 12/18/23 0606 98.4 F (36.9 C)     Temp Source 12/18/23 0606 Oral     SpO2 12/18/23 0606 100 %     Weight 12/18/23 0605 198 lb (89.8 kg)     Height 12/18/23 0605 5' (1.524 m)     Head Circumference --      Peak Flow --      Pain Score 12/18/23 0605 10     Pain Loc --      Pain Education --      Exclude from Growth Chart --     Most recent vital signs: Vitals:   12/18/23 1530 12/18/23 1554  BP: 110/64 116/68  Pulse: 73 78  Resp: (!) 22 17  Temp: 98.4 F (36.9 C) (!) 97.2 F (36.2 C)  SpO2: 93% 97%    General: Awake, no distress.  CV:  Good peripheral perfusion.  Regular rate rhythm Resp:  Normal effort.  Abd:  No distention.  Soft with diffuse lower abdominal tenderness    ED Results / Procedures / Treatments   Labs (all labs ordered are listed, but only abnormal results are displayed) Labs Reviewed  COMPREHENSIVE METABOLIC PANEL - Abnormal; Notable for the following components:      Result Value   Sodium 134 (*)    CO2 20 (*)    Glucose, Bld 124 (*)    Calcium 8.5 (*)    All other components within normal limits  URINALYSIS, ROUTINE W REFLEX MICROSCOPIC - Abnormal; Notable for the  following components:   Color, Urine YELLOW (*)    APPearance HAZY (*)    Specific Gravity, Urine 1.031 (*)    Hgb urine dipstick LARGE (*)    Leukocytes,Ua TRACE (*)    Bacteria, UA FEW (*)    All other components within normal limits  HCG, QUANTITATIVE, PREGNANCY - Abnormal; Notable for the following components:   hCG, Beta Chain, Quant, S 1,182 (*)    All other components within normal limits  POC URINE PREG, ED - Abnormal; Notable for the following components:   Preg Test, Ur POSITIVE (*)    All other components within normal limits  LIPASE, BLOOD  CBC  PROTIME-INR  TYPE AND SCREEN  SURGICAL PATHOLOGY     EKG    RADIOLOGY Ultrasound OB interpreted by me, shows right adnexal mass with free fluid in the pelvis.  Radiology report reviewed, findings discussed with radiologist.   PROCEDURES:  Procedures   MEDICATIONS ORDERED IN ED: Medications  lactated ringers infusion ( Intravenous Anesthesia Volume  Adjustment 12/18/23 1337)  fentaNYL (SUBLIMAZE) injection 25-50 mcg (25 mcg Intravenous Given 12/18/23 1515)  sodium chloride 0.9 % bolus 1,000 mL (1,000 mLs Intravenous New Bag/Given 12/18/23 0829)  morphine (PF) 4 MG/ML injection 4 mg (4 mg Intravenous Given 12/18/23 0830)  acetaminophen (TYLENOL) tablet 1,000 mg (1,000 mg Oral Given 12/18/23 0948)  celecoxib (CELEBREX) capsule 400 mg (400 mg Oral Given 12/18/23 0948)  gabapentin (NEURONTIN) capsule 300 mg (300 mg Oral Given 12/18/23 0948)  oxyCODONE (Oxy IR/ROXICODONE) immediate release tablet 5 mg (5 mg Oral Given 12/18/23 1518)    Or  oxyCODONE (ROXICODONE) 5 MG/5ML solution 5 mg ( Oral See Alternative 12/18/23 1518)  methotrexate (for ectopic pregnancy) 25 mg/mL chemo injection (97.5 mg Intramuscular Given 12/18/23 1509)     IMPRESSION / MDM / ASSESSMENT AND PLAN / ED COURSE  I reviewed the triage vital signs and the nursing notes.  DDx: Ectopic pregnancy, intrauterine pregnancy, UTI, dysmenorrhea  Patient's  presentation is most consistent with acute presentation with potential threat to life or bodily function.     Clinical Course as of 12/18/23 1643  Fri Dec 18, 2023  [id number] Patient presents with lower abdominal pain, rapid onset and severe last night, associated vaginal spotting.  Pregnancy test is positive, ultrasound discussed with radiology noting that there is free fluid in the abdomen and a suspected ruptured ectopic pregnancy in the right adnexa.  She is seen gynecology Brennan Bailey in the past, will consult [facility].  Patient is n.p.o. since 6:00 last night. [PS]  0818 D/w Gyn CNM who will contact surgeon [PS]  [phone] Pt seen by Dr. Valentino Saxon, posted for OR [PS]    Clinical Course User Index [PS] Sharman Cheek, MD     FINAL CLINICAL IMPRESSION(S) / ED DIAGNOSES   Final diagnoses:  Ruptured right tubal ectopic pregnancy causing hemoperitoneum     Rx / DC Orders   ED Discharge Orders          Ordered    acetaminophen (TYLENOL) 500 MG tablet  Every 6 hours PRN        12/18/23 1403    oxyCODONE (ROXICODONE) 5 MG immediate release tablet  Every 6 hours PRN        12/18/23 1403    Discharge patient        12/18/23 1403             Note:  This document was prepared using Dragon voice recognition software and may include unintentional dictation errors.   Sharman Cheek, MD 12/18/23 [phone]

## 2023-12-18 NOTE — Anesthesia Postprocedure Evaluation (Signed)
 Anesthesia Post Note  Patient: Mandy Romero  Procedure(s) Performed: DIAGNOSTIC LAPAROSCOPY WITH REMOVAL OF ECTOPIC PREGNANCY AND SALPINGOSCOMY (Right: Abdomen)  Patient location during evaluation: PACU Anesthesia Type: General Level of consciousness: awake and awake and alert Pain management: pain level controlled Vital Signs Assessment: post-procedure vital signs reviewed and stable Respiratory status: spontaneous breathing Cardiovascular status: stable Anesthetic complications: no   No notable events documented.   Last Vitals:  Vitals:   12/18/23 0946 12/18/23 1350  BP: (!) 102/57 114/74  Pulse: 78 75  Resp: 18 18  Temp: 37.1 C 36.7 C  SpO2: 98% 100%    Last Pain:  Vitals:   12/18/23 1422  TempSrc:   PainSc: 7                  VAN STAVEREN,Shuntae Herzig

## 2023-12-18 NOTE — Op Note (Signed)
 Procedure(Romero): OPERATIVE LAPAROSCOPY WITH REMOVAL OF RIGHT ECTOPIC PREGNANCY Procedure Note  Mandy Romero female 28 y.o. 12/18/2023  Indications: The patient is a 28 y.o. [phone] female with suspected right ectopic pregnancy, hemoperitoneum at 4.[redacted] weeks gestation.  History of previous left oophorectomy, unclear if left fallopian tube present.   Pre-operative Diagnosis: Suspected ruptured right ectopic pregnancy, hemoperitoneum. History of previous left oophorectomy  Post-operative Diagnosis: Right ectopic pregnancy (unruptured), small hemoperitoneum. Left salpingo-oophorectomy  Surgeon: Hildred Laser, MD  Assistants:  Surgical scrub assist.   Anesthesia: General endotracheal anesthesia  Findings: The uterus was sounded to 12 cm Left fallopian tube and ovary surgically absent. Normal appearing uterus.  Few filmy adhesions of the small bowel to posterior left side of the uterus.  Right fallopian tubes appeared intact, containing ectopic pregnancy.  Small hemoperitoneum, ~ 210 ml of blood and clots.  Normal upper abdomen.   Procedure Details: The patient was seen in the Holding Room. The risks, benefits, complications, treatment options, and expected outcomes were discussed with the patient.  The patient concurred with the proposed plan, giving informed consent.  The site of surgery properly noted/marked. The patient was taken to the Operating Room, identified as Mandy Romero and the procedure verified as Procedure(Romero) (LRB): DIAGNOSTIC LAPAROSCOPY WITH REMOVAL OF ECTOPIC PREGNANCY (Right) CHROMOPERTUBATION.   She was then placed under general anesthesia without difficulty. She was placed in the dorsal lithotomy position, and was prepped and draped in a sterile manner.  A straight catheterization was performed. A sterile speculum was inserted into the vagina and the cervix was grasped at the anterior lip using a single-toothed tenaculum.  The uterus was sounded to 12 cm, 10  ml of 0.5% Marcaine was injected circumferentially into the cervix for local analgesia.  A ZUMI uterine manipulator was placed.   The speculum and tenaculum were then removed. After an adequate timeout was performed, attention was turned to the abdomen where an umbilical incision was made with the scalpel.  The Optiview 5-mm trocar and sleeve were then advanced without difficulty with the laparoscope under direct visualization into the abdomen.  The abdomen was then insufflated with carbon dioxide gas and adequate pneumoperitoneum was obtained. A 5-mm right lower quadrant port and an 11-mm left lower quadrant port were then placed under direct visualization.  A survey of the patient'Romero pelvis and abdomen revealed the findings as above.    Attention was then turned to the right fallopian tube which was grasped and inspected. No evidence of rupture noted. Based on this finding and noted absence of contralateral tube, the decision was made to perform a salpingostomy.  Ani incision was made into the fallopian tube was made using monopolar shears. The tubal incision was widened, and the ectopic pregnancy was grasped and removed from the fallopian tube and placed into an Endocatch bag.  Several clots were removed from the tube and the tube was then irrigated.  Small amount of oozing was noted from the fallopian tube mucosa and this area was cauterized using the Kleppinger device. Fibrillar was then placed into the right fallopian tube for additional hemostasis.  The tube was left open to close by secondary intention. Good hemostasis was noted.  The specimen was removed from the abdomen intact within the Endocatch bag.  Due to absence of other fallopian tube, no chromotubation was performed. The 11-mm trochar was removed. A cone and PMI device was used to close the fascia using a 0-Vicryl stitch. The abdomen was desufflated, and all instruments and  trochars were removed.  All skin incisions were closed with 3-0 Vicryl and  Dermabond.  A total of 16 ml of 0.5% Sensorcaine was injected into the incisions. The patient tolerated the procedure well.  Sponge, lap, and needle counts were correct times three.  The patient was then taken to the recovery room awake, extubated and in stable condition.   The patient will be discharged to home as per PACU criteria.  Routine postoperative instructions given.  She was administered 1 dose of Methotrexate for treatment of any possible residual products of conception after salpingostomy.  Oxycodone, Acetaminophen prescribed. She will follow up in the office in about 1-2 weeks for postoperative evaluation.   Estimated Blood Loss:  50 ml operative blood loss, 210 ml of hemoperitoneum.       Drains: straight catheterization prior to procedure with 100 ml of clear urine         Total IV Fluids: 700 ml  Specimens: Ectopic pregnancy (right)         Implants: None         Complications:  None; patient tolerated the procedure well.         Disposition: PACU - hemodynamically stable.         Condition: stable   Hildred Laser, MD [facility]

## 2023-12-18 NOTE — ED Triage Notes (Signed)
 Pt reports she began to have generalized abd pain last night. Pt denies n/v/d vaginal bleeding or dysuria.

## 2023-12-18 NOTE — Anesthesia Preprocedure Evaluation (Signed)
 Anesthesia Evaluation  Patient identified by MRN, date of birth, ID band Patient awake    Reviewed: Allergy & Precautions, NPO status , Patient's Chart, lab work & pertinent test results  History of Anesthesia Complications Negative for: history of anesthetic complications  Airway Mallampati: II  TM Distance: >3 FB Neck ROM: full    Dental  (+) Chipped   Pulmonary neg pulmonary ROS, neg shortness of breath   Pulmonary exam normal        Cardiovascular Exercise Tolerance: Good (-) angina negative cardio ROS Normal cardiovascular exam     Neuro/Psych  Headaches  negative psych ROS   GI/Hepatic negative GI ROS, Neg liver ROS,neg GERD  ,,  Endo/Other  negative endocrine ROS    Renal/GU      Musculoskeletal   Abdominal   Peds  Hematology negative hematology ROS (+)   Anesthesia Other Findings Past Medical History: No date: Migraine No date: Ovarian cyst No date: Ovarian cyst No date: Peritonsillar abscess No date: Preterm labor  Past Surgical History: 05/2012: CHOLECYSTECTOMY 07/30/2020: LAPAROSCOPIC LYSIS OF ADHESIONS     Comment:  Procedure: LAPAROSCOPIC LYSIS OF PELVIC ADHESIONS;                Surgeon: Linzie Collin, MD;  Location: [facility];                Service: Gynecology;; 07/30/2020: LAPAROSCOPIC OVARIAN CYSTECTOMY; Left     Comment:  Procedure: LAPAROSCOPIC OVARIAN CYSTECTOMY;  Surgeon:               Linzie Collin, MD;  Location: [facility];  Service:               Gynecology;  Laterality: Left; 02/20/2021: LAPAROSCOPIC OVARIAN CYSTECTOMY; Left     Comment:  Procedure: LAPAROSCOPIC OVARIAN CYSTECTOMY;  Surgeon:               Linzie Collin, MD;  Location: [facility];  Service:               Gynecology;  Laterality: Left; 02/20/2021: LAPAROSCOPY; N/A     Comment:  Procedure: LAPAROSCOPY DIAGNOSTIC;  Surgeon: Linzie Collin, MD;  Location: [facility];  Service:                Gynecology;  Laterality: N/A; 01/27/2023: TONSILLECTOMY; Bilateral     Comment:  Procedure: TONSILLECTOMY;  Surgeon: Geanie Logan, MD;                Location: [facility];  Service: ENT;                Laterality: Bilateral;  BMI    Body Mass Index: 38.67 kg/m      Reproductive/Obstetrics negative OB ROS                             Anesthesia Physical Anesthesia Plan  ASA: 2 and emergent  Anesthesia Plan: General ETT   Post-op Pain Management:    Induction: Intravenous  PONV Risk Score and Plan: Ondansetron, Dexamethasone, Midazolam and Treatment may vary due to age or medical condition  Airway Management Planned: Oral ETT  Additional Equipment:   Intra-op Plan:   Post-operative Plan: Extubation in OR  Informed Consent: I have reviewed the patients History and Physical, chart, labs and discussed the procedure including the risks, benefits and  alternatives for the proposed anesthesia with the patient or authorized representative who has indicated his/her understanding and acceptance.     Dental Advisory Given  Plan Discussed with: Anesthesiologist, CRNA and Surgeon  Anesthesia Plan Comments: (Patient consented for risks of anesthesia including but not limited to:  - adverse reactions to medications - damage to eyes, teeth, lips or other oral mucosa - nerve damage due to positioning  - sore throat or hoarseness - Damage to heart, brain, nerves, lungs, other parts of body or loss of life  Patient voiced understanding and assent.)       Anesthesia Quick Evaluation

## 2023-12-18 NOTE — Consult Note (Signed)
 GYNECOLOGY PREOPERATIVE HISTORY AND PHYSICAL   Subjective:  Reason for consult: Right ectopic pregnancy (ruptured) Referring Provider: Sharman Cheek, MD  Mandy Romero is a 28 y.o. [phone] at 4.[redacted] weeks gestation who presented to the Emergency Room due to complaints of generalized abdominal pain that started overnight.  Denied vaginal bleeding, nausea/vomiting, urinary or bowel disturbances.  Last ate around 6 pm yesterday. Upon arrival to ER was discovered to be pregnant. Patient's last menstrual period was 11/15/2023 (exact date). Imaging concerning for ruptured ectopic pregnancy on right, no IUP visualized.  Of note, patient with prior history of left oophorectomy (unsure if she still has left tube) due to ovarian cyst.     Proposed surgery: Laparoscopic Right Salpingectomy (vs salpingostomy), chromotubation     Pertinent Gynecological History: Menses: flow is moderate Contraception: none currently.  Previously using OCPs Last pap: normal Date: 02/05/2022   Past Medical History:  Diagnosis Date   Migraine    Ovarian cyst    Ovarian cyst    Peritonsillar abscess    Preterm labor     Past Surgical History:  Procedure Laterality Date   CHOLECYSTECTOMY  05/2012   LAPAROSCOPIC LYSIS OF ADHESIONS  07/30/2020   Procedure: LAPAROSCOPIC LYSIS OF PELVIC ADHESIONS;  Surgeon: Linzie Collin, MD;  Location: [facility];  Service: Gynecology;;   LAPAROSCOPIC OVARIAN CYSTECTOMY Left 07/30/2020   Procedure: LAPAROSCOPIC OVARIAN CYSTECTOMY;  Surgeon: Linzie Collin, MD;  Location: [facility];  Service: Gynecology;  Laterality: Left;   LAPAROSCOPIC OVARIAN CYSTECTOMY Left 02/20/2021   Procedure: LAPAROSCOPIC OVARIAN CYSTECTOMY;  Surgeon: Linzie Collin, MD;  Location: [facility];  Service: Gynecology;  Laterality: Left;   LAPAROSCOPY N/A 02/20/2021   Procedure: LAPAROSCOPY DIAGNOSTIC;  Surgeon: Linzie Collin, MD;  Location: [facility];  Service: Gynecology;  Laterality: N/A;    TONSILLECTOMY Bilateral 01/27/2023   Procedure: TONSILLECTOMY;  Surgeon: Geanie Logan, MD;  Location: [facility];  Service: ENT;  Laterality: Bilateral;    OB History  Gravida Para Term Preterm AB Living  4 3 1 2  3   SAB IAB Ectopic Multiple Live Births     0 3    # Outcome Date GA Lbr Len/2nd Weight Sex Type Anes PTL Lv  4 Current           3 Term 01/28/17 [redacted]w[redacted]d  3160 g M Vag-Spont Other  LIV  2 Preterm 2016 [redacted]w[redacted]d  3062 g M Vag-Spont None  LIV  1 Preterm 2013 [redacted]w[redacted]d  3033 g M Vag-Spont None  LIV    Family History  Problem Relation Age of Onset   Diabetes Father    Hypertension Father    Cancer Maternal Aunt    Gestational diabetes Sister     Social History   Socioeconomic History   Marital status: Married    Spouse name: Not on file   Number of children: Not on file   Years of education: Not on file   Highest education level: Not on file  Occupational History   Not on file  Tobacco Use   Smoking status: Never   Smokeless tobacco: Never  Vaping Use   Vaping status: Never Used  Substance and Sexual Activity   Alcohol use: Not Currently    Comment: rarely   Drug use: Never   Sexual activity: Yes    Partners: Male    Birth control/protection: Pill, Surgical    Comment: L oophorectomy  Other Topics Concern   Not on file  Social  History Narrative   Not on file   Social Drivers of Health   Financial Resource Strain: Not on file  Food Insecurity: Not on file  Transportation Needs: Not on file  Physical Activity: Not on file  Stress: Not on file  Social Connections: Not on file  Intimate Partner Violence: Not on file    No current facility-administered medications on file prior to encounter.   Current Outpatient Medications on File Prior to Encounter  Medication Sig Dispense Refill   cyclobenzaprine (FLEXERIL) 5 MG tablet Take 1 tablet (5 mg total) by mouth 3 (three) times daily as needed. 15 tablet 0   Norethindrone-Ethinyl Estradiol-Fe Biphas  (LO LOESTRIN FE) 1 MG-10 MCG / 10 MCG tablet Take 1 tablet by mouth daily. 28 tablet 11   No Known Allergies    Review of Systems Constitutional: No recent fever/chills/sweats Respiratory: No recent cough/bronchitis Cardiovascular: No chest pain Gastrointestinal: No recent nausea/vomiting/diarrhea Genitourinary: No UTI symptoms Hematologic/lymphatic:No history of coagulopathy or recent blood thinner use    Objective:   Blood pressure (!) 110/58, pulse 81, temperature 98.4 F (36.9 C), temperature source Oral, resp. rate 17, height 5' (1.524 m), weight 89.8 kg, last menstrual period 11/15/2023, SpO2 99%. CONSTITUTIONAL: Well-developed, well-nourished female in mild distress.  HENT:  Normocephalic, atraumatic, External right and left ear normal. Oropharynx is clear and moist EYES: Conjunctivae and EOM are normal. Pupils are equal, round, and reactive to light. No scleral icterus.  NECK: Normal range of motion, supple, no masses SKIN: Skin is warm and dry. No rash noted. Not diaphoretic. No erythema. No pallor. NEUROLOGIC: Alert and oriented to person, place, and time. Normal reflexes, muscle tone coordination. No cranial nerve deficit noted. PSYCHIATRIC: Normal mood and affect. Normal behavior. Normal judgment and thought content. CARDIOVASCULAR: Normal heart rate noted, regular rhythm RESPIRATORY: Effort and breath sounds normal, no problems with respiration noted ABDOMEN: Soft, mildly tender in lower abdomen, L>R. Nondistended. PELVIC: Deferred MUSCULOSKELETAL: Normal range of motion. No edema and no tenderness. 2+ distal pulses.    Labs: Results for orders placed or performed during the hospital encounter of 12/18/23 (from the past 2 weeks)  Lipase, blood   Collection Time: 12/18/23  6:06 AM  Result Value Ref Range   Lipase 26 11 - 51 U/L  Comprehensive metabolic panel   Collection Time: 12/18/23  6:06 AM  Result Value Ref Range   Sodium 134 (L) 135 - 145 mmol/L    Potassium 3.5 3.5 - 5.1 mmol/L   Chloride 108 98 - 111 mmol/L   CO2 20 (L) 22 - 32 mmol/L   Glucose, Bld 124 (H) 70 - 99 mg/dL   BUN 11 6 - 20 mg/dL   Creatinine, Ser 1.61 0.44 - 1.00 mg/dL   Calcium 8.5 (L) 8.9 - 10.3 mg/dL   Total Protein 6.9 6.5 - 8.1 g/dL   Albumin 3.8 3.5 - 5.0 g/dL   AST 22 15 - 41 U/L   ALT 29 0 - 44 U/L   Alkaline Phosphatase 54 38 - 126 U/L   Total Bilirubin 1.0 0.0 - 1.2 mg/dL   GFR, Estimated >09 >60 mL/min   Anion gap 6 5 - 15  CBC   Collection Time: 12/18/23  6:06 AM  Result Value Ref Range   WBC 6.9 4.0 - 10.5 K/uL   RBC 4.32 3.87 - 5.11 MIL/uL   Hemoglobin 13.0 12.0 - 15.0 g/dL   HCT 45.4 09.8 - 11.9 %   MCV 87.5 80.0 - 100.0 fL  MCH 30.1 26.0 - 34.0 pg   MCHC 34.4 30.0 - 36.0 g/dL   RDW 29.5 62.1 - 30.8 %   Platelets 248 150 - 400 K/uL   nRBC 0.0 0.0 - 0.2 %  Urinalysis, Routine w reflex microscopic -Urine, Clean Catch   Collection Time: 12/18/23  6:06 AM  Result Value Ref Range   Color, Urine YELLOW (A) YELLOW   APPearance HAZY (A) CLEAR   Specific Gravity, Urine 1.031 (H) 1.005 - 1.030   pH 5.0 5.0 - 8.0   Glucose, UA NEGATIVE NEGATIVE mg/dL   Hgb urine dipstick LARGE (A) NEGATIVE   Bilirubin Urine NEGATIVE NEGATIVE   Ketones, ur NEGATIVE NEGATIVE mg/dL   Protein, ur NEGATIVE NEGATIVE mg/dL   Nitrite NEGATIVE NEGATIVE   Leukocytes,Ua TRACE (A) NEGATIVE   RBC / HPF 21-50 0 - 5 RBC/hpf   WBC, UA 11-20 0 - 5 WBC/hpf   Bacteria, UA FEW (A) NONE SEEN   Squamous Epithelial / HPF 6-10 0 - 5 /HPF   Mucus PRESENT   hCG, quantitative, pregnancy   Collection Time: 12/18/23  6:06 AM  Result Value Ref Range   hCG, Beta Chain, Quant, S 1,182 (H) <5 mIU/mL  POC urine preg, ED   Collection Time: 12/18/23  6:09 AM  Result Value Ref Range   Preg Test, Ur POSITIVE (A) NEGATIVE  Protime-INR   Collection Time: 12/18/23  8:27 AM  Result Value Ref Range   Prothrombin Time 13.8 11.4 - 15.2 seconds   INR 1.0 0.8 - 1.2  Type and screen [facility]   Collection Time: 12/18/23  8:27 AM  Result Value Ref Range   ABO/RH(D) PENDING    Antibody Screen PENDING    Sample Expiration      12/21/2023,2359 Performed at [facility], [address] [place], [place] [address]      Imaging Studies: US OB LESS THAN 14 WEEKS WITH OB TRANSVAGINAL Result Date: 12/18/2023 CLINICAL DATA:  Early pregnancy with generalized abdominal pain since last night. EXAM: OBSTETRIC <14 WK [place] AND TRANSVAGINAL OB US TECHNIQUE: Both transabdominal and transvaginal ultrasound examinations were performed for complete evaluation of the gestation as well as the maternal uterus, adnexal regions, and pelvic cul-de-sac. Transvaginal technique was performed to assess early pregnancy. COMPARISON:  None Available. FINDINGS: Intrauterine gestational sac: None Yolk sac:  Not Visualized. Embryo:  Not Visualized. Cardiac Activity: Not Visualized. Heart Rate: N/A  bpm Subchorionic hemorrhage:  None visualized. Maternal uterus/adnexae: Right ovary: Not visualized Left ovary: Not visualized Other :Within the right adnexa there is of equivocal complex right adnexal mass with peripheral echogenic ring without increased vascularity. This measures approximately 2.4 x 1.9 x 2.5 cm. There is increased heterogeneous echogenic material within bilateral adnexa, right greater than left, which diminishes exam detail in these areas. Free fluid: Moderate to large free fluid noted within the cul-de-sac. IMPRESSION: 1. No intrauterine gestational sac, yolk sac, or fetal pole identified. 2. Increased heterogeneous echogenic material within bilateral adnexa, right greater than left concerning for blood products. 3. Within the right adnexa there is an equivocal, complex mass with echogenic ring for which ruptured ectopic pregnancy cannot be excluded. Critical Value/emergent results were called by telephone at the time of interpretation on 12/18/2023 at 7:54 am to provider Dr.  Scotty Court, who verbally acknowledged these results. Electronically Signed   By: Signa Kell M.D.   On: 12/18/2023 07:55    Assessment:    Right ectopic pregnancy (ruptured) History of left  oophorectomy   Plan:   Counseling: Patient with suspected ruptured right ectopic pregnancy. On exam, she had stable vital signs, and stable Hgb of 13 on admission  . Patient was counseled regarding need for laparoscopic salpingectomy. Risks of surgery including bleeding which may require transfusion or reoperation, infection, injury to bowel or other surrounding organs, need for additional procedures including laparotomy were explained to patient and written informed consent was obtained.  Due to patient's age and history of contralateral oophorectomy (unsure if tube remains) counseled on possible salpingostomy with tubal repair if tube was salvageable. If left fallopian tube is still present, will perform chromotubation to assess patency of other tube. If left fallopian tube is absent and right fallopian tube is not salvageable, she understands that this will significantly affect her fertility and would likely require reproductive assistance in the future with IVF. Patient has been NPO since 6 PM yesterday and she will remain NPO for procedure. Anesthesia and OR aware.  Preoperative prophylactic antibiotics and SCDs ordered on call to the OR.  To OR when ready.      Hildred Laser, MD [facility]

## 2023-12-21 ENCOUNTER — Encounter: Payer: Self-pay | Admitting: Obstetrics and Gynecology

## 2023-12-22 LAB — SURGICAL PATHOLOGY

## 2023-12-30 NOTE — Progress Notes (Unsigned)
    OBSTETRICS/GYNECOLOGY POST-OPERATIVE CLINIC VISIT  Subjective:     Mandy Romero is a 28 y.o. female who presents to the clinic 2 weeks status post DIAGNOSTIC LAPAROSCOPY WITH REMOVAL OF ECTOPIC PREGNANCY AND SALPINGOSCOMY  for ectopic . Eating a regular diet {with-without:5700} difficulty. Bowel movements are {normal/abnormal***:19619}. {pain control:13522::"The patient is not having any pain."}  {Common ambulatory SmartLinks:19316}  Review of Systems {ros; complete:30496}   Objective:   LMP 11/15/2023 (Exact Date)  There is no height or weight on file to calculate BMI.  General:  alert and no distress  Abdomen: soft, bowel sounds active, non-tender  Incision:   {incision:13716::"no dehiscence","incision well approximated","healing well","no drainage","no erythema","no hernia","no seroma","no swelling"}    Pathology:   MRN: [id number] Physician: Hildred Laser DOB/Age [birth year] (Age: 55) Gender: F Collected Date: 12/18/2023 Received Date: 12/18/2023  FINAL DIAGNOSIS       1. Products of Conception, right ectopic pregnancy :      - CHORIONIC VILLI CONSISTENT WITH ECTOPIC PRODUCTS OF CONCEPTION.      - ORGANIZING BLOOD CLOT.  DATE SIGNED OUT: 12/22/2023 ELECTRONIC SIGNATURE : Oneita Kras Md, Delice Bison , Pathologist, Electronic Signature  MICROSCOPIC DESCRIPTION  CASE COMMENTS STAINS USED IN DIAGNOSIS: H&E H&E H&E  CLINICAL HISTORY  SPECIMEN(S) OBTAINED 1. Products of Conception, Right Ectopic Pregnancy  SPECIMEN COMMENTS: SPECIMEN CLINICAL INFORMATION: 1. Ectopic  Gross Description 1. "Right ectopic pregnancy", received fresh are 2 undesignated, unoriented portions soft tissue:      Portion A: 1.9 x 1.2 x 0.5 cm; red-gray, smooth, glistening exterior surface,      friable, pink-brown cut surfaces      Portion B: 2.0 x  1. 4 x 1.2 cm; red-pink, mildly wrinkled external surface with      a 1.0 cm disruption; diffusely dark red, hemorrhagic, homogenous cut  surfaces      Discrete fetal tissue, villous tissue, and cystic structures are not identified.      The specimen is submitted entirely.      Block summary:      1A: Portion A      1B-C: Portion B      AMG 12/18/2023   Assessment:   Patient s/p DIAGNOSTIC LAPAROSCOPY WITH REMOVAL OF ECTOPIC PREGNANCY AND SALPINGOSCOMY  (surgery)  {doing well:13525::"Doing well postoperatively."}   Plan:   1. Continue any current medications as instructed by provider. 2. Wound care discussed. 3. Operative findings again reviewed. Pathology report discussed. 4. Activity restrictions: {restrictions:13723} 5. Anticipated return to work: {work return:14002}. 6. Follow up: {4-09:81191} {time; units:18646} for ***    Hildred Laser, MD [facility]

## 2023-12-31 ENCOUNTER — Ambulatory Visit (INDEPENDENT_AMBULATORY_CARE_PROVIDER_SITE_OTHER): Payer: Medicaid Other | Admitting: Obstetrics and Gynecology

## 2023-12-31 ENCOUNTER — Encounter: Payer: Self-pay | Admitting: Obstetrics and Gynecology

## 2023-12-31 VITALS — BP 111/56 | HR 79 | Ht 60.0 in | Wt 206.2 lb

## 2023-12-31 DIAGNOSIS — Z4889 Encounter for other specified surgical aftercare: Secondary | ICD-10-CM

## 2023-12-31 DIAGNOSIS — Z09 Encounter for follow-up examination after completed treatment for conditions other than malignant neoplasm: Secondary | ICD-10-CM

## 2023-12-31 DIAGNOSIS — Z3141 Encounter for fertility testing: Secondary | ICD-10-CM

## 2023-12-31 DIAGNOSIS — Z8759 Personal history of other complications of pregnancy, childbirth and the puerperium: Secondary | ICD-10-CM

## 2023-12-31 NOTE — Addendum Note (Signed)
 Addended by: Fabian November on: 12/31/2023 11:49 AM   Modules accepted: Orders

## 2024-01-06 ENCOUNTER — Encounter: Payer: Self-pay | Admitting: Obstetrics and Gynecology

## 2024-01-25 ENCOUNTER — Ambulatory Visit (INDEPENDENT_AMBULATORY_CARE_PROVIDER_SITE_OTHER)

## 2024-01-25 ENCOUNTER — Other Ambulatory Visit (HOSPITAL_COMMUNITY)
Admission: RE | Admit: 2024-01-25 | Discharge: 2024-01-25 | Disposition: A | Discharge status: Home / Self Care | Source: Ambulatory Visit | Attending: Obstetrics and Gynecology | Admitting: Obstetrics and Gynecology

## 2024-01-25 ENCOUNTER — Encounter: Payer: Self-pay | Admitting: Obstetrics

## 2024-01-25 VITALS — BP 100/67 | HR 78 | Ht 60.0 in | Wt 210.8 lb

## 2024-01-25 DIAGNOSIS — N898 Other specified noninflammatory disorders of vagina: Secondary | ICD-10-CM | POA: Diagnosis present

## 2024-01-25 DIAGNOSIS — R350 Frequency of micturition: Secondary | ICD-10-CM | POA: Diagnosis not present

## 2024-01-25 LAB — POCT URINALYSIS DIPSTICK
Bilirubin, UA: NEGATIVE
Glucose, UA: NEGATIVE
Ketones, UA: NEGATIVE
Leukocytes, UA: NEGATIVE
Nitrite, UA: NEGATIVE
Protein, UA: NEGATIVE
Spec Grav, UA: 1.01 (ref 1.010–1.025)
Urobilinogen, UA: 0.2 U/dL
pH, UA: 8 (ref 5.0–8.0)

## 2024-01-25 NOTE — Patient Instructions (Addendum)
 Irritation of the Vagina (Vaginitis): What to Know  Vaginitis is irritation and swelling of the vagina. It happens when the usual balance of bacteria and yeast in the vagina changes. This change causes some types to grow too much. This overgrowth leads to vaginitis. What are the causes? Bacteria. Yeast, which is a fungus. A parasite. A virus. Low hormones in the body. This can occur during pregnancy, breastfeeding, or after menopause. What increases the risk? Irritants, such as douches, bubble baths, scented tampons, and feminine sprays. Antibiotics. Poor hygiene. Wearing tight pants or thong underwear. Some birth control methods, such as diaphragms, vaginal sponges, or spermicides. Having sex without a condom or having sex with more than one person. Infections. Uncontrolled diabetes. What are the signs or symptoms? Abnormal fluid from the vagina. The fluid may be: White, gray, or yellow. Thick, white, and cheesy. Frothy and yellow or green. A bad smell from the vagina. Itching, pain, or swelling in the vagina. Pain during sex. Pain or burning when you pee. How is this diagnosed? This condition is diagnosed based on your symptoms, medical history, and an exam. This may include a pelvic exam. Tests may also be done. Tests may be done to: Check the pH level of your vagina. Check the fluid in your vagina. How is this treated? Treatment will depend on what is causing your vaginitis. Treatment may include: Antibiotics. Antifungal medicines. Medicines to treat symptoms if you have a virus. Your sex partner should also be treated. Estrogen medicines. Medicines to treat allergies. The medicines may be pills or creams. Follow these instructions at home: Lifestyle Keep the area around your vagina clean and dry. Avoid using soap. Rinse the area with water. Until your health care provider says it's okay: Do not douche. Do not use tampons. Use pads, if needed. Do not have sex. Wipe  from front to back after going to the bathroom. When the provider says it's okay, practice safe sex. Use condoms. General instructions Take your medicines only as told. If you were given antibiotics, take them as told. Do not stop taking them even if you start to feel better. How is this prevented? Use mild, unscented products. Avoid the following products if they are scented: Sprays. Detergents. Tampons. Products for cleaning the vagina. Soaps or bubble baths. Let air reach your genital area. To do this: Wear cotton underwear. Do not wear underwear while you sleep. Do not wear tight pants and underwear or pantyhose without a cotton panel. Do not wear thong underwear. Take off any wet clothing, such as bathing suits, as soon as possible. Practice safe sex. Use condoms. Contact a health care provider if: You have pain in the belly or around the pelvis. You have a fever or chills. You have symptoms that last for more than 2-3 days. This information is not intended to replace advice given to you by your health care provider. Make sure you discuss any questions you have with your health care provider. Document Revised: 07/09/2023 Document Reviewed: 02/16/2023 Elsevier Patient Education  2024 [employer]   Urinary Frequency, Adult  Urinary frequency means urinating more often than usual. You may urinate every 1-2 hours even though you drink a normal amount of fluid and do not have a bladder infection or condition. Although you urinate more often than normal, the total amount of urine produced in a day is normal. With urinary frequency, you may have an urgent need to urinate often. The stress and anxiety of needing to find a bathroom  quickly can make this urge worse. This condition may go away on its own, or you may need treatment at home. Home treatment may include bladder training, exercises, taking medicines, or making changes to your diet. Follow these instructions at home: Bladder  health Your health care provider will tell you what to do to improve bladder health. You may be told to: Keep a bladder diary. Keep track of: What you eat and drink. How often you urinate. How much you urinate. Follow a bladder training program. This may include: Learning to delay going to the bathroom. Double urinating, also called voiding. This helps if you are not completely emptying your bladder. Scheduled voiding. Do Kegel exercises. Kegel exercises strengthen the muscles that help control urination, which may help the condition.  Eating and drinking Follow instructions from your health care provider about eating or drinking restrictions. You may be told to: Avoid caffeine. Drink fewer fluids, especially alcohol. Avoid drinking in the evening. Avoid foods or drinks that may irritate the bladder. These include coffee, tea, soda, artificial sweeteners, citrus, tomato-based foods, and chocolate. Eat foods that help prevent or treat constipation. Constipation can make urinary frequency worse. You may need to take these actions to prevent or treat constipation: Drink enough fluid to keep your urine pale yellow. Take over-the-counter or prescription medicines. Eat foods that are high in fiber, such as beans, whole grains, and fresh fruits and vegetables. Limit foods that are high in fat and processed sugars, such as fried or sweet foods. General instructions Take over-the-counter and prescription medicines only as told by your health care provider. Keep all follow-up visits. This is important. Contact a health care provider if: You start urinating more often. You feel pain or irritation when you urinate. You notice blood in your urine. Your urine looks cloudy. You develop a fever. You begin vomiting. Get help right away if: You are unable to urinate. Summary Urinary frequency means urinating more often than usual. With urinary frequency, you may urinate every 1-2 hours even though  you drink a normal amount of fluid and do not have a bladder infection or other bladder condition. Your health care provider may recommend that you keep a bladder diary, follow a bladder training program, or make dietary changes. If told by your health care provider, do Kegel exercises to strengthen the muscles that help control urination. Take over-the-counter and prescription medicines only as told by your health care provider. Contact a health care provider if your symptoms do not improve or get worse. This information is not intended to replace advice given to you by your health care provider. Make sure you discuss any questions you have with your health care provider. Document Revised: 04/12/2020 Document Reviewed: 05/11/2020 Elsevier Patient Education  2024 [employer].

## 2024-01-25 NOTE — Progress Notes (Signed)
    NURSE VISIT NOTE  Subjective:    Patient ID: Lenice Pressman, female    DOB: 1996/09/04, 28 y.o.   MRN: [id number]  HPI  Patient is a 28 y.o. [phone] female who presents for white and thick vaginal discharge for 1.5 week. Denies abnormal vaginal bleeding or significant pelvic pain or fever. reports urinary frequency, genital irritation, and vaginal discharge. Patient denies history of known exposure to STD.   Objective:    BP 100/67   Pulse 78   Ht 5' (1.524 m)   Wt 210 lb 12.8 oz (95.6 kg)   LMP 01/24/2024 (Exact Date)   Breastfeeding No   BMI 41.17 kg/m      Assessment:   1. Vaginal discharge   2. Urinary frequency     rule out GC or chlamydia and nonspecific vaginitis  Plan:   GC and chlamydia DNA  probe sent to lab.  ROV prn if symptoms persist or worsen.   Fonda Kinder, CMA

## 2024-01-27 LAB — CERVICOVAGINAL ANCILLARY ONLY
Bacterial Vaginitis (gardnerella): NEGATIVE
Candida Glabrata: NEGATIVE
Candida Vaginitis: NEGATIVE
Chlamydia: NEGATIVE
Comment: NEGATIVE
Comment: NEGATIVE
Comment: NEGATIVE
Comment: NEGATIVE
Comment: NEGATIVE
Comment: NORMAL
Neisseria Gonorrhea: NEGATIVE
Trichomonas: NEGATIVE

## 2024-01-27 LAB — URINE CULTURE

## 2024-01-29 ENCOUNTER — Ambulatory Visit
Admission: RE | Admit: 2024-01-29 | Discharge: 2024-01-29 | Disposition: A | Discharge status: Home / Self Care | Source: Ambulatory Visit | Attending: Obstetrics and Gynecology | Admitting: Obstetrics and Gynecology

## 2024-01-29 DIAGNOSIS — N979 Female infertility, unspecified: Secondary | ICD-10-CM | POA: Insufficient documentation

## 2024-01-29 DIAGNOSIS — Z3189 Encounter for other procreative management: Secondary | ICD-10-CM | POA: Insufficient documentation

## 2024-01-29 DIAGNOSIS — Z8759 Personal history of other complications of pregnancy, childbirth and the puerperium: Secondary | ICD-10-CM | POA: Diagnosis present

## 2024-01-29 DIAGNOSIS — Z90721 Acquired absence of ovaries, unilateral: Secondary | ICD-10-CM | POA: Diagnosis not present

## 2024-01-29 DIAGNOSIS — Z9079 Acquired absence of other genital organ(s): Secondary | ICD-10-CM | POA: Diagnosis not present

## 2024-01-29 DIAGNOSIS — Z3141 Encounter for fertility testing: Secondary | ICD-10-CM

## 2024-01-29 MED ORDER — IOHEXOL 300 MG/ML  SOLN
50.0000 mL | Freq: Once | INTRAMUSCULAR | Status: AC | PRN
  Administered 2024-01-29: 7 mL via INTRATHECAL

## 2024-01-31 ENCOUNTER — Other Ambulatory Visit: Payer: Self-pay | Admitting: Obstetrics and Gynecology

## 2024-01-31 MED ORDER — DOXYCYCLINE HYCLATE 100 MG PO CAPS: 100.0000 mg | ORAL_CAPSULE | Freq: Two times a day (BID) | ORAL | 0 refills | Status: AC

## 2024-01-31 NOTE — Procedures (Signed)
 Hysterosalpingogram Post-Procedure Note  Pre-procedure Diagnosis: Infertility, female; history of right salpingostomy for ectopic pregnancy treatment. Previous history of left salpingectomy.    Post-operative Diagnosis: Same   Indications: Infertility   Procedure Details:   Consent: Informed consent was obtained. Risks of the procedure were discussed including: infection, bleeding, pain, and allergic reaction to dye.  A speculum was inserted into the patient's vagina.  The cervix was cleansed with Betadine and a single-toothed tenaculum was used to grasp the anterior lip of the cervix.  The cervical opening was cannulated per standard procedure.  Then under fluoroscopic guidance, 7cc of water soluble contrast was injected in a retrograde fashion. Spillage was noted from the right tube after approximately 15 seconds.  X-ray imaging captured per the Radiologist technician. The cannula was then removed from the uterine cavity.  The tenaculum was removed from the cervix with good hemostasis noted, and the speculum was removed.  The patient tolerated the procedure well.    Findings:   The uterus fills normally, with no evidence of contour abnormality, filling defect, septum, mass, or bicornuate configuration.   The right fallopian tube appears normal and patent with normal rapid spillage of contrast into the peritoneum.   The left fallopian tube is surgically absent.  Fluoroscopy time: 1 minute.   Complications: None     Condition: Stable  Plan: Patient to f/u in clinic in 1 week for further management of infertility.     Teresa Fender, MD [facility] at [facility]

## 2024-02-02 ENCOUNTER — Ambulatory Visit: Admitting: Obstetrics and Gynecology
